# Patient Record
Sex: Male | Born: 1935 | Hispanic: Yes | Marital: Married | Smoking: Never smoker
Health system: Southern US, Community
[De-identification: ages and names within clinical notes are randomized; demographics above are authoritative.]

## PROBLEM LIST (undated history)

## (undated) DIAGNOSIS — N400 Enlarged prostate without lower urinary tract symptoms: Secondary | ICD-10-CM

## (undated) DIAGNOSIS — I219 Acute myocardial infarction, unspecified: Secondary | ICD-10-CM

## (undated) DIAGNOSIS — Z972 Presence of dental prosthetic device (complete) (partial): Secondary | ICD-10-CM

## (undated) DIAGNOSIS — M545 Low back pain, unspecified: Secondary | ICD-10-CM

## (undated) DIAGNOSIS — I2510 Atherosclerotic heart disease of native coronary artery without angina pectoris: Secondary | ICD-10-CM

## (undated) DIAGNOSIS — M5030 Other cervical disc degeneration, unspecified cervical region: Secondary | ICD-10-CM

## (undated) DIAGNOSIS — D179 Benign lipomatous neoplasm, unspecified: Secondary | ICD-10-CM

## (undated) DIAGNOSIS — N189 Chronic kidney disease, unspecified: Secondary | ICD-10-CM

## (undated) DIAGNOSIS — E079 Disorder of thyroid, unspecified: Secondary | ICD-10-CM

## (undated) DIAGNOSIS — J449 Chronic obstructive pulmonary disease, unspecified: Secondary | ICD-10-CM

## (undated) DIAGNOSIS — I10 Essential (primary) hypertension: Secondary | ICD-10-CM

## (undated) DIAGNOSIS — M1990 Unspecified osteoarthritis, unspecified site: Secondary | ICD-10-CM

## (undated) HISTORY — DX: Unspecified osteoarthritis, unspecified site: M19.90

## (undated) HISTORY — PX: HEMORRHOID SURGERY: SHX153

## (undated) HISTORY — DX: Other cervical disc degeneration, unspecified cervical region: M50.30

## (undated) HISTORY — DX: Chronic obstructive pulmonary disease, unspecified: J44.9

## (undated) HISTORY — DX: Disorder of thyroid, unspecified: E07.9

## (undated) HISTORY — PX: CHOLECYSTECTOMY: SHX55

## (undated) HISTORY — DX: Atherosclerotic heart disease of native coronary artery without angina pectoris: I25.10

## (undated) HISTORY — DX: Benign prostatic hyperplasia without lower urinary tract symptoms: N40.0

## (undated) HISTORY — DX: Chronic kidney disease, unspecified: N18.9

## (undated) HISTORY — DX: Benign lipomatous neoplasm, unspecified: D17.9

---

## 1998-05-09 HISTORY — PX: CARDIAC CATHETERIZATION: SHX172

## 2008-03-09 HISTORY — PX: BLADDER SURGERY: SHX569

## 2008-03-18 ENCOUNTER — Ambulatory Visit: Payer: Self-pay | Admitting: Urology

## 2008-03-18 ENCOUNTER — Ambulatory Visit: Payer: Self-pay | Admitting: Cardiology

## 2008-03-24 ENCOUNTER — Ambulatory Visit: Payer: Self-pay | Admitting: Urology

## 2008-03-24 HISTORY — PX: TRANSURETHRAL RESECTION OF PROSTATE: SHX73

## 2011-11-17 ENCOUNTER — Ambulatory Visit: Payer: Self-pay | Admitting: Family Medicine

## 2012-05-09 HISTORY — PX: CATARACT EXTRACTION W/ INTRAOCULAR LENS  IMPLANT, BILATERAL: SHX1307

## 2012-11-26 ENCOUNTER — Ambulatory Visit: Payer: Self-pay | Admitting: Ophthalmology

## 2012-12-11 ENCOUNTER — Ambulatory Visit: Payer: Self-pay | Admitting: Ophthalmology

## 2013-06-19 ENCOUNTER — Ambulatory Visit: Payer: Self-pay | Admitting: Family Medicine

## 2013-10-14 DIAGNOSIS — R0600 Dyspnea, unspecified: Secondary | ICD-10-CM | POA: Insufficient documentation

## 2013-10-14 DIAGNOSIS — R0601 Orthopnea: Secondary | ICD-10-CM | POA: Insufficient documentation

## 2013-10-14 DIAGNOSIS — R079 Chest pain, unspecified: Secondary | ICD-10-CM | POA: Insufficient documentation

## 2013-10-14 DIAGNOSIS — I2510 Atherosclerotic heart disease of native coronary artery without angina pectoris: Secondary | ICD-10-CM | POA: Insufficient documentation

## 2013-10-14 DIAGNOSIS — R0609 Other forms of dyspnea: Secondary | ICD-10-CM | POA: Insufficient documentation

## 2014-03-13 ENCOUNTER — Ambulatory Visit: Payer: Self-pay | Admitting: Unknown Physician Specialty

## 2014-03-13 HISTORY — PX: COLONOSCOPY: SHX174

## 2014-05-21 ENCOUNTER — Ambulatory Visit: Payer: Self-pay | Admitting: Family Medicine

## 2014-08-07 ENCOUNTER — Ambulatory Visit
Admit: 2014-08-07 | Disposition: A | Discharge status: Home / Self Care | Payer: Self-pay | Attending: Primary Care | Admitting: Primary Care

## 2014-08-29 NOTE — Op Note (Signed)
PATIENT NAME:  Joseph Osborn, Joseph Osborn MR#:  [id number] DATE OF BIRTH:  [birth year]  DATE OF PROCEDURE:  12/11/2012  PREOPERATIVE DIAGNOSIS: Visually significant cataract of the right eye.   POSTOPERATIVE DIAGNOSIS: Visually significant cataract of the right eye.   OPERATIVE PROCEDURE: Cataract extraction by phacoemulsification with implant of intraocular lens to right eye.   SURGEON: Birder Robson, MD.   ANESTHESIA:  1. Managed anesthesia care.  2. Topical tetracaine drops followed by 2% Xylocaine jelly applied in the preoperative holding area.   COMPLICATIONS: None.   TECHNIQUE: Stop and chop.  DESCRIPTION OF PROCEDURE: The patient was examined and consented in the preoperative holding area where the aforementioned topical anesthesia was applied to the right eye and then brought back to the Operating Room where the right eye was prepped and draped in the usual sterile ophthalmic fashion and a lid speculum was placed. A paracentesis was created with the side port blade and the anterior chamber was filled with viscoelastic. A near clear corneal incision was performed with the steel keratome. A continuous curvilinear capsulorrhexis was performed with a cystotome followed by the capsulorrhexis forceps. Hydrodissection and hydrodelineation were carried out with BSS on a blunt cannula. The lens was removed in a stop and chop technique and the remaining cortical material was removed with the irrigation-aspiration handpiece. The capsular bag was inflated with viscoelastic and the Tecnis ZCB00 15.5-diopter lens, serial number [id number], was placed in the capsular bag without complication. The remaining viscoelastic was removed from the eye with the irrigation-aspiration handpiece. The wounds were hydrated. The anterior chamber was flushed with Miostat and the eye was inflated to physiologic pressure. 0.1 mL of cefuroxime concentration 10 mg/mL was placed in the anterior chamber. The wounds were found  to be water tight. The eye was dressed with Vigamox. The patient was given protective glasses to wear throughout the day and a shield with which to sleep tonight. The patient was also given drops with which to begin a drop regimen today and will follow-up with me in one day.    ____________________________ Livingston Diones. Naydeen Speirs, MD wlp:jm D: 12/11/2012 17:19:20 ET T: 12/11/2012 19:37:15 ET JOB#: [id number]  cc: Opie Fanton L. Joelene Barriere, MD, <Dictator> Livingston Diones Mykiah Schmuck MD ELECTRONICALLY SIGNED 12/13/2012 15:32

## 2014-09-01 LAB — SURGICAL PATHOLOGY

## 2014-09-02 DIAGNOSIS — M7540 Impingement syndrome of unspecified shoulder: Secondary | ICD-10-CM | POA: Insufficient documentation

## 2015-05-10 DIAGNOSIS — D179 Benign lipomatous neoplasm, unspecified: Secondary | ICD-10-CM

## 2015-05-10 HISTORY — DX: Benign lipomatous neoplasm, unspecified: D17.9

## 2016-01-08 ENCOUNTER — Other Ambulatory Visit: Payer: Self-pay | Admitting: Family Medicine

## 2016-01-08 DIAGNOSIS — D171 Benign lipomatous neoplasm of skin and subcutaneous tissue of trunk: Secondary | ICD-10-CM

## 2016-01-12 ENCOUNTER — Ambulatory Visit
Admission: RE | Admit: 2016-01-12 | Discharge: 2016-01-12 | Disposition: A | Discharge status: Home / Self Care | Payer: Medicare Other | Source: Ambulatory Visit | Attending: Family Medicine | Admitting: Family Medicine

## 2016-01-12 ENCOUNTER — Other Ambulatory Visit: Payer: Self-pay | Admitting: Family Medicine

## 2016-01-12 DIAGNOSIS — D171 Benign lipomatous neoplasm of skin and subcutaneous tissue of trunk: Secondary | ICD-10-CM

## 2016-01-20 ENCOUNTER — Other Ambulatory Visit: Payer: Self-pay

## 2016-01-20 DIAGNOSIS — N183 Chronic kidney disease, stage 3 unspecified: Secondary | ICD-10-CM | POA: Insufficient documentation

## 2016-01-20 DIAGNOSIS — N400 Enlarged prostate without lower urinary tract symptoms: Secondary | ICD-10-CM | POA: Insufficient documentation

## 2016-01-20 DIAGNOSIS — J449 Chronic obstructive pulmonary disease, unspecified: Secondary | ICD-10-CM | POA: Insufficient documentation

## 2016-01-20 DIAGNOSIS — I2089 Other forms of angina pectoris: Secondary | ICD-10-CM | POA: Insufficient documentation

## 2016-01-20 DIAGNOSIS — I208 Other forms of angina pectoris: Secondary | ICD-10-CM | POA: Insufficient documentation

## 2016-01-20 DIAGNOSIS — N182 Chronic kidney disease, stage 2 (mild): Secondary | ICD-10-CM | POA: Insufficient documentation

## 2016-01-20 DIAGNOSIS — M5030 Other cervical disc degeneration, unspecified cervical region: Secondary | ICD-10-CM | POA: Insufficient documentation

## 2016-01-20 DIAGNOSIS — M1990 Unspecified osteoarthritis, unspecified site: Secondary | ICD-10-CM | POA: Insufficient documentation

## 2016-01-20 DIAGNOSIS — E039 Hypothyroidism, unspecified: Secondary | ICD-10-CM | POA: Insufficient documentation

## 2016-01-21 ENCOUNTER — Ambulatory Visit (INDEPENDENT_AMBULATORY_CARE_PROVIDER_SITE_OTHER): Payer: Medicare Other | Admitting: Surgery

## 2016-01-21 ENCOUNTER — Other Ambulatory Visit
Admission: RE | Admit: 2016-01-21 | Discharge: 2016-01-21 | Disposition: A | Discharge status: Home / Self Care | Payer: Medicare Other | Source: Ambulatory Visit | Attending: Surgery | Admitting: Surgery

## 2016-01-21 ENCOUNTER — Encounter: Payer: Self-pay | Admitting: Surgery

## 2016-01-21 ENCOUNTER — Ambulatory Visit: Payer: Self-pay | Admitting: Surgery

## 2016-01-21 ENCOUNTER — Other Ambulatory Visit: Payer: Self-pay

## 2016-01-21 VITALS — BP 149/82 | HR 66 | Temp 97.7°F | Ht 67.0 in | Wt 176.0 lb

## 2016-01-21 DIAGNOSIS — R222 Localized swelling, mass and lump, trunk: Secondary | ICD-10-CM | POA: Diagnosis present

## 2016-01-21 LAB — COMPREHENSIVE METABOLIC PANEL
ALBUMIN: 4.5 g/dL (ref 3.5–5.0)
ALK PHOS: 61 U/L (ref 38–126)
ALT: 17 U/L (ref 17–63)
AST: 20 U/L (ref 15–41)
Anion gap: 5 (ref 5–15)
BUN: 18 mg/dL (ref 6–20)
CALCIUM: 9.2 mg/dL (ref 8.9–10.3)
CO2: 28 mmol/L (ref 22–32)
Chloride: 107 mmol/L (ref 101–111)
Creatinine, Ser: 1.07 mg/dL (ref 0.61–1.24)
GFR calc Af Amer: >60 mL/min (ref >60)
GFR calc non Af Amer: 60 mL/min — ABNORMAL LOW (ref >60)
Glucose, Bld: 83 mg/dL (ref 65–99)
Potassium: 5.3 mmol/L — ABNORMAL HIGH (ref 3.5–5.1)
SODIUM: 140 mmol/L (ref 135–145)
Total Bilirubin: 0.9 mg/dL (ref 0.3–1.2)
Total Protein: 7.6 g/dL (ref 6.5–8.1)

## 2016-01-21 NOTE — Patient Instructions (Addendum)
Your CT scan is scheduled for 02/02/16 @ 3:00pm at [facility]. We will need for you to go to the lab today. Please see follow up appointment listed below. Please see your follow up appointment listed below with Dr.pabon.

## 2016-01-21 NOTE — Progress Notes (Signed)
Patient ID: Joseph Osborn, male   DOB: 01-06-1926, 82 y.o.   MRN: [id number]  HPI Joseph Osborn is a 82 y.o. male   HPI Joseph Osborn its a relatively healthy 82 year old male referred by Dr. Posey Pronto for a posterior chest wall mass. He reports that and this was found incidentally by Dr. Posey Pronto a few weeks ago. He states that this mass sometimes it is tender at times but he is unsure whether is his back or is actually related to the soft tissue mass. He reports intermittent moderate dull pain on his back on his posterior chest wall. He also reports having had multiple mild falls in the last 2-4 weeks. He does have a history of coronary artery disease and having an MI in 1999 requiring a stent. He has a great cardiovascular performance is able to do more than 4 Mets of activity without any shortness of breath or chest pain. He is very lucid and very independent. Dr. Posey Pronto ordered an ultrasound showing a hypoechoic solid area measuring 9 cm x 7 cm. And this was well circumscribed but did not look like a lipoma nor a solid mass   PMHX: CAD HTN  Surg HX Open Chole  No family history on file.  Social History Social History  Substance Use Topics  . Smoking status: Never Smoker  . Smokeless tobacco: Never Used  . Alcohol use Yes     Comment: very rare    Allergies  Allergen Reactions  . Other Nausea And Vomiting  . Oxybutynin Other (See Comments)  . Shellfish Allergy Other (See Comments)    Current Outpatient Prescriptions  Medication Sig Dispense Refill  . atenolol (TENORMIN) 50 MG tablet      No current facility-administered medications for this visit.      Review of Systems A 10 point review of systems was asked and was negative except for the information on the HPI  Physical Exam Blood pressure (!) 149/82, pulse 66, temperature 97.7 F (36.5 C), temperature source Oral, height 5\' 7"  (1.702 m), weight 79.8 kg (176 lb). CONSTITUTIONAL: NAD well developed EYES:  Pupils are equal, round, and reactive to light, Sclera are non-icteric. EARS, NOSE, MOUTH AND THROAT: The oropharynx is clear. The oral mucosa is pink and moist. Hearing is intact to voice. LYMPH NODES:  Lymph nodes in the neck are normal. RESPIRATORY:  Lungs are clear. There is normal respiratory effort, with equal breath sounds bilaterally, and without pathologic use of accessory muscles. CARDIOVASCULAR: Heart is regular without murmurs, gallops, or rubs. CHEST WALL: There is a mobile left posterior chest wall prominence encompassing an area of 10 x 13 cm approximately, it is mobile, it is not solid and a is reminiscence of a cyst GI: The abdomen is  soft, nontender, and nondistended. There are no palpable masses. There is no hepatosplenomegaly. There are normal bowel sounds in all quadrants.  Rectal deferred.   MUSCULOSKELETAL: Normal muscle strength and tone. No cyanosis or edema.   SKIN: Turgor is good and there are no pathologic skin lesions or ulcers. NEUROLOGIC: Motor and sensation is grossly normal. Cranial nerves are grossly intact. PSYCH:  Oriented to person, place and time. Affect is normal.  Data Reviewed I have personally reviewed the patient's imaging, laboratory findings and medical records.    Assessment Plan Soft tissue mass on the posterior chest wall unclear etiology favor cystic component versus seroma. Given the size the next step would be to do a CT scan of the  chest wall to rule out any malignant lesions. Discussed with the patient in detail about his disease process and about the need to obtain further diagnostic testing before definitive management can be obtained. He understands and is in agreement with the plan  Caroleen Hamman, MD FACS General Surgeon 01/21/2016, 3:33 PM

## 2016-02-02 ENCOUNTER — Ambulatory Visit
Admission: RE | Admit: 2016-02-02 | Discharge: 2016-02-02 | Disposition: A | Discharge status: Home / Self Care | Payer: Medicare Other | Source: Ambulatory Visit | Attending: Surgery | Admitting: Surgery

## 2016-02-02 DIAGNOSIS — J479 Bronchiectasis, uncomplicated: Secondary | ICD-10-CM | POA: Diagnosis not present

## 2016-02-02 DIAGNOSIS — D171 Benign lipomatous neoplasm of skin and subcutaneous tissue of trunk: Secondary | ICD-10-CM | POA: Insufficient documentation

## 2016-02-02 DIAGNOSIS — Q2546 Tortuous aortic arch: Secondary | ICD-10-CM | POA: Insufficient documentation

## 2016-02-02 DIAGNOSIS — R222 Localized swelling, mass and lump, trunk: Secondary | ICD-10-CM | POA: Diagnosis not present

## 2016-02-02 DIAGNOSIS — I700 Atherosclerosis of aorta: Secondary | ICD-10-CM | POA: Diagnosis not present

## 2016-02-02 MED ORDER — IOPAMIDOL (ISOVUE-300) INJECTION 61%
75.0000 mL | Freq: Once | INTRAVENOUS | Status: AC | PRN
  Administered 2016-02-02: 75 mL via INTRAVENOUS

## 2016-02-04 ENCOUNTER — Telehealth: Payer: Self-pay

## 2016-02-04 NOTE — Telephone Encounter (Signed)
Patient notified of CT results at this time. Also reminded patient of his follow up appointment with DR.Pabon on 02/11/16 @ 9:30. Verbalized understanding.

## 2016-02-11 ENCOUNTER — Telehealth: Payer: Self-pay

## 2016-02-11 ENCOUNTER — Ambulatory Visit (INDEPENDENT_AMBULATORY_CARE_PROVIDER_SITE_OTHER): Payer: Medicare Other | Admitting: Surgery

## 2016-02-11 ENCOUNTER — Encounter: Payer: Self-pay | Admitting: Surgery

## 2016-02-11 VITALS — BP 161/88 | HR 80 | Temp 97.6°F | Ht 67.0 in | Wt 171.8 lb

## 2016-02-11 DIAGNOSIS — K921 Melena: Secondary | ICD-10-CM

## 2016-02-11 NOTE — Patient Instructions (Signed)
Nosotros lo vamos a llamar con una cita para su endoscopia y colonoscopia. Si tiene alguna pregunta, por favor llamenos.

## 2016-02-11 NOTE — Telephone Encounter (Signed)
Can you please schedule an Endoscopy and Colonoscopy for this patient. Thank you! Diagnosis: Melena and Hematochezia  Patient speaks Spanish only.

## 2016-02-11 NOTE — Progress Notes (Signed)
Outpatient Surgical Follow Up  02/11/2016  Joseph Osborn is an 82 y.o. male.   Chief Complaint  Patient presents with  . Follow-up    Lipoma on Left Posterior Chest Wall    HPI: F/U back lipoma. Ct scan reviewed, Evidence of large lipoma. No evidence of malignant characteristics, no evidence of sarcoma.  HE also report some constipation, some melena and hematochezia. HE had a colonoscopy about 2 years ago but hematochezia is new. Now melena is more frequent.   Past Medical History:  Diagnosis Date  . Arthritis   . BPH (benign prostatic hyperplasia)   . CAD (coronary artery disease)   . Chronic kidney disease    Stage III  . COPD (chronic obstructive pulmonary disease) ([place])   . DDD (degenerative disc disease), cervical   . Lipoma 2017  . Thyroid disease    Hypothyroidism    History reviewed. No pertinent surgical history.  Family History  Problem Relation Age of Onset  . Heart disease Mother     Social History:  reports that he has never smoked. He has never used smokeless tobacco. He reports that he drinks alcohol. He reports that he does not use drugs.  Allergies:  Allergies  Allergen Reactions  . Other Nausea And Vomiting  . Oxybutynin Other (See Comments)  . Shellfish Allergy Other (See Comments)    Medications reviewed.    ROS Full ROS performed and is otherwise negative other than what is included on HPI   BP (!) 161/88   Pulse 80   Temp 97.6 F (36.4 C) (Oral)   Ht 5\' 7"  (1.702 m)   Wt 77.9 kg (171 lb 12.8 oz)   BMI 26.91 kg/m   Physical Exam  Constitutional: He is oriented to person, place, and time and well-developed, well-nourished, and in no distress. No distress.  Eyes: Conjunctivae are normal. No scleral icterus.  Neck: Normal range of motion. Neck supple. No JVD present.  Pulmonary/Chest: Effort normal. No respiratory distress.  Large sub q mass below left scapula, mobile and soft  Abdominal: Soft. He exhibits no distension.  There is no tenderness.  Musculoskeletal: Normal range of motion. He exhibits no edema.  Neurological: He is alert and oriented to person, place, and time. Gait normal. GCS score is 15.  Skin: Skin is warm and dry.  Psychiatric: Mood, memory, affect and judgment normal.  Nursing note and vitals reviewed.      No results found for this or any previous visit (from the past 48 hour(s)). No results found.  Assessment/Plan: Lipoma d/w the pt about options. Excisions vs obs. He wishes no to have surgery at this time. His main complaint today was melena. We will arrange for upper and lower scopes w Dr. Allen Norris, I gave him the options of having scope by Dr. Tiffany Kocher or Dr. Allen Norris and he chooses Dr. Allen Norris. At least 25 minutes spent in this encounter w the majority of time allocated to counseling.  Caroleen Hamman, MD FACS General Surgeon  02/11/2016,12:39 PM

## 2016-02-15 ENCOUNTER — Other Ambulatory Visit: Payer: Self-pay

## 2016-02-15 NOTE — Telephone Encounter (Signed)
Gastroenterology Pre-Procedure Review  Request Date: 03/03/2016 Requesting Physician: Dr. Posey Pronto  PATIENT REVIEW QUESTIONS: The patient responded to the following health history questions as indicated:    1. Are you having any GI issues? yes (constipation ) 2. Do you have a personal history of Polyps? no 3. Do you have a family history of Colon Cancer or Polyps? no 4. Diabetes Mellitus? no 5. Joint replacements in the past 12 months?no 6. Major health problems in the past 3 months?no 7. Any artificial heart valves, MVP, or defibrillator?no    MEDICATIONS & ALLERGIES:    Patient reports the following regarding taking any anticoagulation/antiplatelet therapy:   Plavix, Coumadin, Eliquis, Xarelto, Lovenox, Pradaxa, Brilinta, or Effient? no Aspirin? no  Patient confirms/reports the following medications:  Current Outpatient Prescriptions  Medication Sig Dispense Refill  . atenolol (TENORMIN) 50 MG tablet Take 50 mg by mouth daily.      No current facility-administered medications for this visit.     Patient confirms/reports the following allergies:  Allergies  Allergen Reactions  . Other Nausea And Vomiting  . Oxybutynin Other (See Comments)  . Shellfish Allergy Other (See Comments)    No orders of the defined types were placed in this encounter.   AUTHORIZATION INFORMATION Primary Insurance: 1D#: Group #:  Secondary Insurance: 1D#: Group #:  SCHEDULE INFORMATION: Date: 03/03/2016 Time: Location: [facility]

## 2016-02-15 NOTE — Telephone Encounter (Signed)
Colonoscopy & EGD  Melena & hematochezia K92.1 [facility] 03/03/2016 Medicaid/Medicare  Pre cert is not required

## 2016-02-24 ENCOUNTER — Encounter: Payer: Self-pay | Admitting: *Deleted

## 2016-03-02 NOTE — Discharge Instructions (Signed)
Anestesia general, adultos, cuidados posteriores °(General Anesthesia, Adult, Care After) °Siga estas instrucciones durante las próximas semanas. Estas indicaciones le proporcionan información acerca de cómo deberá cuidarse después del procedimiento. El médico también podrá darle instrucciones más específicas. El tratamiento se ha planificado de acuerdo a las prácticas médicas actuales, pero a veces se producen problemas. Comuníquese con el médico si tiene algún problema o tiene dudas después del procedimiento. °QUÉ ESPERAR DESPUÉS DEL PROCEDIMIENTO °Después del procedimiento es habitual experimentar: °· Somnolencia. °· Náuseas y vómitos. °INSTRUCCIONES PARA EL CUIDADO EN EL HOGAR °· Durante las primeras 24 horas luego de la anestesia general: °¨ Haga que una persona responsable se quede con usted. °¨ No conduzca un automóvil. Si está solo, no viaje en transporte público. °¨ No beba alcohol. °¨ No tome medicamentos que no le haya recetado su médico. °¨ No firme documentos importantes ni tome decisiones trascendentes. °¨ Puede reanudar su dieta y sus actividades normales según le haya indicado el médico. °· Cambie los vendajes (apósitos) tal como se le indicó. °· Si tiene preguntas o se le presenta algún problema relacionado con la anestesia general, comuníquese con el hospital y pida por el anestesista o anestesiólogo de guardia. °SOLICITE ATENCIÓN MÉDICA SI: °· Tiene náuseas y vómitos durante el día posterior a la anestesia. °· Le aparece una erupción cutánea. °SOLICITE ATENCIÓN MÉDICA DE INMEDIATO SI:  °· Tiene dificultad para respirar. °· Siente dolor en el pecho. °· Tiene algún problema alérgico. °  °Esta información no tiene como fin reemplazar el consejo del médico. Asegúrese de hacerle al médico cualquier pregunta que tenga. °  °Document Released: 04/25/2005 Document Revised: 05/16/2014 °Elsevier Interactive Patient Education ©2016 Elsevier Inc. ° °

## 2016-03-03 ENCOUNTER — Ambulatory Visit
Admission: RE | Admit: 2016-03-03 | Discharge: 2016-03-03 | Disposition: A | Discharge status: Home / Self Care | Payer: Medicare Other | Source: Ambulatory Visit | Attending: Gastroenterology | Admitting: Gastroenterology

## 2016-03-03 ENCOUNTER — Ambulatory Visit: Payer: Medicare Other | Admitting: Anesthesiology

## 2016-03-03 ENCOUNTER — Encounter
Admission: RE | Disposition: A | Discharge status: Home / Self Care | Payer: Self-pay | Source: Ambulatory Visit | Attending: Gastroenterology

## 2016-03-03 DIAGNOSIS — K449 Diaphragmatic hernia without obstruction or gangrene: Secondary | ICD-10-CM | POA: Insufficient documentation

## 2016-03-03 DIAGNOSIS — J449 Chronic obstructive pulmonary disease, unspecified: Secondary | ICD-10-CM | POA: Diagnosis not present

## 2016-03-03 DIAGNOSIS — K2970 Gastritis, unspecified, without bleeding: Secondary | ICD-10-CM | POA: Diagnosis not present

## 2016-03-03 DIAGNOSIS — I2510 Atherosclerotic heart disease of native coronary artery without angina pectoris: Secondary | ICD-10-CM | POA: Insufficient documentation

## 2016-03-03 DIAGNOSIS — K648 Other hemorrhoids: Secondary | ICD-10-CM | POA: Diagnosis not present

## 2016-03-03 DIAGNOSIS — K921 Melena: Secondary | ICD-10-CM | POA: Insufficient documentation

## 2016-03-03 DIAGNOSIS — I252 Old myocardial infarction: Secondary | ICD-10-CM | POA: Insufficient documentation

## 2016-03-03 DIAGNOSIS — M1990 Unspecified osteoarthritis, unspecified site: Secondary | ICD-10-CM | POA: Insufficient documentation

## 2016-03-03 DIAGNOSIS — N400 Enlarged prostate without lower urinary tract symptoms: Secondary | ICD-10-CM | POA: Insufficient documentation

## 2016-03-03 DIAGNOSIS — N183 Chronic kidney disease, stage 3 (moderate): Secondary | ICD-10-CM | POA: Diagnosis not present

## 2016-03-03 DIAGNOSIS — E039 Hypothyroidism, unspecified: Secondary | ICD-10-CM | POA: Diagnosis not present

## 2016-03-03 HISTORY — PX: ESOPHAGOGASTRODUODENOSCOPY (EGD) WITH PROPOFOL: SHX5813

## 2016-03-03 HISTORY — DX: Presence of dental prosthetic device (complete) (partial): Z97.2

## 2016-03-03 HISTORY — DX: Acute myocardial infarction, unspecified: I21.9

## 2016-03-03 HISTORY — DX: Low back pain: M54.5

## 2016-03-03 HISTORY — DX: Low back pain, unspecified: M54.50

## 2016-03-03 HISTORY — PX: COLONOSCOPY WITH PROPOFOL: SHX5780

## 2016-03-03 SURGERY — COLONOSCOPY WITH PROPOFOL
Anesthesia: Monitor Anesthesia Care | Wound class: Contaminated

## 2016-03-03 MED ORDER — ACETAMINOPHEN 325 MG PO TABS: 325.0000 mg | ORAL_TABLET | ORAL | Status: DC | PRN

## 2016-03-03 MED ORDER — ACETAMINOPHEN 160 MG/5ML PO SOLN: 325.0000 mg | ORAL | Status: DC | PRN

## 2016-03-03 MED ORDER — LIDOCAINE HCL (CARDIAC) 20 MG/ML IV SOLN
INTRAVENOUS | Status: DC | PRN
  Administered 2016-03-03: 50 mg via INTRAVENOUS

## 2016-03-03 MED ORDER — LACTATED RINGERS IV SOLN
INTRAVENOUS | Status: DC
  Administered 2016-03-03: 10:00:00 via INTRAVENOUS

## 2016-03-03 MED ORDER — GLYCOPYRROLATE 0.2 MG/ML IJ SOLN
INTRAMUSCULAR | Status: DC | PRN
  Administered 2016-03-03: .2 mg via INTRAVENOUS

## 2016-03-03 MED ORDER — PROPOFOL 10 MG/ML IV BOLUS
INTRAVENOUS | Status: DC | PRN
  Administered 2016-03-03 (×5): 20 mg via INTRAVENOUS
  Administered 2016-03-03 (×2): 10 mg via INTRAVENOUS
  Administered 2016-03-03: 60 mg via INTRAVENOUS
  Administered 2016-03-03 (×4): 20 mg via INTRAVENOUS

## 2016-03-03 MED ORDER — STERILE WATER FOR IRRIGATION IR SOLN
Status: DC | PRN
  Administered 2016-03-03: 10:00:00

## 2016-03-03 SURGICAL SUPPLY — 35 items

## 2016-03-03 NOTE — Op Note (Signed)
[facility] Gastroenterology Patient Name: Joseph Osborn Procedure Date: 03/03/2016 9:52 AM MRN: [id number] Account #: [id number] Date of Birth: 1925-05-23 Admit Type: Outpatient Age: 82 Room: [facility] OR ROOM 01 Gender: Male Note Status: Finalized Procedure:            Colonoscopy Indications:          Hematochezia Providers:            Lucilla Lame MD, MD Medicines:            Propofol per Anesthesia Complications:        No immediate complications. Procedure:            Pre-Anesthesia Assessment:                       - Prior to the procedure, a History and Physical was                        performed, and patient medications and allergies were                        reviewed. The patient's tolerance of previous                        anesthesia was also reviewed. The risks and benefits of                        the procedure and the sedation options and risks were                        discussed with the patient. All questions were                        answered, and informed consent was obtained. Prior                        Anticoagulants: The patient has taken no previous                        anticoagulant or antiplatelet agents. ASA Grade                        Assessment: II - A patient with mild systemic disease.                        After reviewing the risks and benefits, the patient was                        deemed in satisfactory condition to undergo the                        procedure.                       After obtaining informed consent, the colonoscope was                        passed under direct vision. Throughout the procedure,                        the patient's blood pressure, pulse, and oxygen  saturations were monitored continuously. The Olympus CF                        H180AL colonoscope (S#: [id number]) was introduced through                        the anus and advanced to the the cecum, identified by              appendiceal orifice and ileocecal valve. The                        colonoscopy was performed without difficulty. The                        patient tolerated the procedure well. The quality of                        the bowel preparation was excellent. Findings:      The perianal and digital rectal examinations were normal.      Non-bleeding internal hemorrhoids were found during retroflexion. The       hemorrhoids were Grade II (internal hemorrhoids that prolapse but reduce       spontaneously). Impression:           - Non-bleeding internal hemorrhoids.                       - No specimens collected. Recommendation:       - Discharge patient to home.                       - Resume previous diet.                       - Continue present medications. Procedure Code(s):    --- Professional ---                       [phone], Colonoscopy, flexible; diagnostic, including                        collection of specimen(s) by brushing or washing, when                        performed (separate procedure) Diagnosis Code(s):    --- Professional ---                       K92.1, Melena (includes Hematochezia) CPT copyright 2016 American Medical Association. All rights reserved. The codes documented in this report are preliminary and upon coder review may  be revised to meet current compliance requirements. Lucilla Lame MD, MD 03/03/2016 10:27:46 AM This report has been signed electronically. Number of Addenda: 0 Note Initiated On: 03/03/2016 9:52 AM Scope Withdrawal Time: 0 hours 5 minutes 51 seconds  Total Procedure Duration: 0 hours 9 minutes 41 seconds       [facility]

## 2016-03-03 NOTE — Anesthesia Preprocedure Evaluation (Signed)
Anesthesia Evaluation  Patient identified by MRN, date of birth, ID band Patient awake    Reviewed: Allergy & Precautions, H&P , NPO status   Airway Mallampati: II  TM Distance: >3 FB Neck ROM: full    Dental   Pulmonary COPD,    breath sounds clear to auscultation       Cardiovascular + angina + CAD, + Past MI and + DOE   Rhythm:regular Rate:Normal     Neuro/Psych    GI/Hepatic   Endo/Other  Hypothyroidism   Renal/GU Renal disease     Musculoskeletal   Abdominal   Peds  Hematology   Anesthesia Other Findings   Reproductive/Obstetrics                             Anesthesia Physical Anesthesia Plan  ASA: III  Anesthesia Plan: MAC   Post-op Pain Management:    Induction:   Airway Management Planned:   Additional Equipment:   Intra-op Plan:   Post-operative Plan:   Informed Consent: I have reviewed the patients History and Physical, chart, labs and discussed the procedure including the risks, benefits and alternatives for the proposed anesthesia with the patient or authorized representative who has indicated his/her understanding and acceptance.     Plan Discussed with: CRNA  Anesthesia Plan Comments:         Anesthesia Quick Evaluation

## 2016-03-03 NOTE — Anesthesia Postprocedure Evaluation (Signed)
Anesthesia Post Note  Patient: Joseph Osborn  Procedure(s) Performed: Procedure(s) (LRB): COLONOSCOPY WITH PROPOFOL (N/A) ESOPHAGOGASTRODUODENOSCOPY (EGD) WITH PROPOFOL (N/A)  Patient location during evaluation: PACU Anesthesia Type: MAC Level of consciousness: awake and alert Pain management: pain level controlled Vital Signs Assessment: post-procedure vital signs reviewed and stable Respiratory status: spontaneous breathing, nonlabored ventilation, respiratory function stable and patient connected to nasal cannula oxygen Cardiovascular status: stable and blood pressure returned to baseline Anesthetic complications: no    Amaryllis Dyke

## 2016-03-03 NOTE — Transfer of Care (Signed)
Immediate Anesthesia Transfer of Care Note  Patient: Joseph Osborn  Procedure(s) Performed: Procedure(s) with comments: COLONOSCOPY WITH PROPOFOL (N/A) ESOPHAGOGASTRODUODENOSCOPY (EGD) WITH PROPOFOL (N/A) - Interpreter needed  Patient Location: PACU  Anesthesia Type: MAC  Level of Consciousness: awake, alert  and patient cooperative  Airway and Oxygen Therapy: Patient Spontanous Breathing and Patient connected to supplemental oxygen  Post-op Assessment: Post-op Vital signs reviewed, Patient's Cardiovascular Status Stable, Respiratory Function Stable, Patent Airway and No signs of Nausea or vomiting  Post-op Vital Signs: Reviewed and stable  Complications: No apparent anesthesia complications

## 2016-03-03 NOTE — Op Note (Signed)
[facility] Gastroenterology Patient Name: Joseph Osborn Procedure Date: 03/03/2016 9:52 AM MRN: [id number] Account #: [id number] Date of Birth: 1925-11-15 Admit Type: Outpatient Age: 82 Room: [facility] OR ROOM 01 Gender: Male Note Status: Finalized Procedure:            Upper GI endoscopy Indications:          Melena Providers:            Lucilla Lame MD, MD Referring MD:         Denton Lank MD, MD (Referring MD) Medicines:            Propofol per Anesthesia Complications:        No immediate complications. Procedure:            Pre-Anesthesia Assessment:                       - Prior to the procedure, a History and Physical was                        performed, and patient medications and allergies were                        reviewed. The patient's tolerance of previous                        anesthesia was also reviewed. The risks and benefits of                        the procedure and the sedation options and risks were                        discussed with the patient. All questions were                        answered, and informed consent was obtained. Prior                        Anticoagulants: The patient has taken no previous                        anticoagulant or antiplatelet agents. ASA Grade                        Assessment: II - A patient with mild systemic disease.                        After reviewing the risks and benefits, the patient was                        deemed in satisfactory condition to undergo the                        procedure.                       After obtaining informed consent, the endoscope was                        passed under direct vision. Throughout the procedure,  the patient's blood pressure, pulse, and oxygen                        saturations were monitored continuously. The Olympus                        GIF H180J colonscope [id number] was introduced                        through the mouth,  and advanced to the second part of                        duodenum. The upper GI endoscopy was accomplished                        without difficulty. The patient tolerated the procedure                        well. Findings:      A small hiatal hernia was present.      Localized moderate inflammation characterized by erythema was found in       the gastric antrum. Biopsies were taken with a cold forceps for       histology.      The examined duodenum was normal. Impression:           - Small hiatal hernia.                       - Gastritis. Biopsied.                       - Normal examined duodenum. Recommendation:       - Discharge patient to home.                       - Resume previous diet.                       - Continue present medications.                       - Await pathology results.                       - Perform a colonoscopy today. Procedure Code(s):    --- Professional ---                       [phone], Esophagogastroduodenoscopy, flexible, transoral;                        with biopsy, single or multiple Diagnosis Code(s):    --- Professional ---                       K92.1, Melena (includes Hematochezia)                       K29.70, Gastritis, unspecified, without bleeding CPT copyright 2016 American Medical Association. All rights reserved. The codes documented in this report are preliminary and upon coder review may  be revised to meet current compliance requirements. Lucilla Lame MD, MD 03/03/2016 10:12:49 AM This report has been signed electronically. Number of Addenda: 0 Note Initiated On: 03/03/2016  9:52 AM      [facility]

## 2016-03-03 NOTE — Anesthesia Procedure Notes (Signed)
Procedure Name: MAC Performed by: Lucielle Vokes Pre-anesthesia Checklist: Patient identified, Emergency Drugs available, Suction available, Timeout performed and Patient being monitored Patient Re-evaluated:Patient Re-evaluated prior to inductionOxygen Delivery Method: Nasal cannula Placement Confirmation: positive ETCO2       

## 2016-03-03 NOTE — H&P (Signed)
Lucilla Lame, MD [facility] [address] [place], [place] [address] Phone: [phone] Fax : [phone]  Primary Care Physician:  Baltazar Apo, MD Primary Gastroenterologist:  Dr. Allen Norris  Pre-Procedure History & Physical: HPI:  Joseph Osborn is a 82 y.o. male is here for an endoscopy and colonoscopy.   Past Medical History:  Diagnosis Date  . Arthritis   . BPH (benign prostatic hyperplasia)   . CAD (coronary artery disease)   . Chronic kidney disease    Stage III  . COPD (chronic obstructive pulmonary disease) ([place])   . DDD (degenerative disc disease), cervical   . Lipoma 2017  . Lower back pain   . Myocardial infarction    approx 2000  . Thyroid disease    Hypothyroidism  . Wears dentures    partial upper    Past Surgical History:  Procedure Laterality Date  . CARDIAC CATHETERIZATION  2000   1 stent placed after MI  . CATARACT EXTRACTION W/ INTRAOCULAR LENS  IMPLANT, BILATERAL  2014   [facility], Dr. George Ina  . CHOLECYSTECTOMY     In [place], [place]  . COLONOSCOPY  03/13/2014   [facility], Dr. Vira Agar  . [place], [place]  . TRANSURETHRAL RESECTION OF PROSTATE  03/24/2008   Dr Eliberto Ivory, [facility]    Prior to Admission medications   Medication Sig Start Date End Date Taking? Authorizing Provider  atenolol (TENORMIN) 50 MG tablet Take 50 mg by mouth daily.  12/22/15  Yes Historical Provider, MD  Omega-3 Fatty Acids (OMEGA 3 PO) Take by mouth.   Yes Historical Provider, MD    Allergies as of 02/15/2016 - never reviewed  Allergen Reaction Noted  . Other Nausea And Vomiting 02/20/2014  . Oxybutynin Other (See Comments) 02/20/2014  . Shellfish allergy Other (See Comments) 02/20/2014    Family History  Problem Relation Age of Onset  . Heart disease Mother     Social History   Social History  . Marital status: Married    Spouse name: N/A  . Number of children: N/A  . Years of education: N/A   Occupational History  . Not on file.    Social History Main Topics  . Smoking status: Never Smoker  . Smokeless tobacco: Never Used  . Alcohol use Yes     Comment: very rare(Holidays)  . Drug use: No  . Sexual activity: Not on file   Other Topics Concern  . Not on file   Social History Narrative  . No narrative on file    Review of Systems: See HPI, otherwise negative ROS  Physical Exam: BP (!) 164/92   Pulse 93   Temp 97.5 F (36.4 C) (Temporal)   Resp 16   Ht 5\' 7"  (1.702 m)   Wt 168 lb (76.2 kg)   SpO2 96%   BMI 26.31 kg/m  General:   Alert,  pleasant and cooperative in NAD Head:  Normocephalic and atraumatic. Neck:  Supple; no masses or thyromegaly. Lungs:  Clear throughout to auscultation.    Heart:  Regular rate and rhythm. Abdomen:  Soft, nontender and nondistended. Normal bowel sounds, without guarding, and without rebound.   Neurologic:  Alert and  oriented x4;  grossly normal neurologically.  Impression/Plan: Joseph Osborn is here for an endoscopy and colonoscopy to be performed for melena and hematochezia  Risks, benefits, limitations, and alternatives regarding  endoscopy and colonoscopy have been reviewed with the patient.  Questions have been answered.  All parties  agreeable.   Lucilla Lame, MD  03/03/2016, 9:54 AM

## 2016-03-04 ENCOUNTER — Encounter: Payer: Self-pay | Admitting: Gastroenterology

## 2016-03-08 ENCOUNTER — Encounter: Payer: Self-pay | Admitting: Gastroenterology
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
## 2017-09-08 ENCOUNTER — Other Ambulatory Visit: Payer: Self-pay

## 2017-09-08 ENCOUNTER — Emergency Department
Admission: EM | Admit: 2017-09-08 | Discharge: 2017-09-09 | Disposition: A | Discharge status: Home / Self Care | Payer: Medicare Other | Attending: Emergency Medicine | Admitting: Emergency Medicine

## 2017-09-08 ENCOUNTER — Encounter: Payer: Self-pay | Admitting: Emergency Medicine

## 2017-09-08 DIAGNOSIS — I252 Old myocardial infarction: Secondary | ICD-10-CM | POA: Insufficient documentation

## 2017-09-08 DIAGNOSIS — E039 Hypothyroidism, unspecified: Secondary | ICD-10-CM | POA: Insufficient documentation

## 2017-09-08 DIAGNOSIS — I2510 Atherosclerotic heart disease of native coronary artery without angina pectoris: Secondary | ICD-10-CM | POA: Diagnosis not present

## 2017-09-08 DIAGNOSIS — K5900 Constipation, unspecified: Secondary | ICD-10-CM | POA: Diagnosis not present

## 2017-09-08 DIAGNOSIS — R1084 Generalized abdominal pain: Secondary | ICD-10-CM | POA: Diagnosis not present

## 2017-09-08 DIAGNOSIS — R112 Nausea with vomiting, unspecified: Secondary | ICD-10-CM | POA: Insufficient documentation

## 2017-09-08 DIAGNOSIS — Z79899 Other long term (current) drug therapy: Secondary | ICD-10-CM | POA: Insufficient documentation

## 2017-09-08 DIAGNOSIS — N183 Chronic kidney disease, stage 3 (moderate): Secondary | ICD-10-CM | POA: Diagnosis not present

## 2017-09-08 DIAGNOSIS — J449 Chronic obstructive pulmonary disease, unspecified: Secondary | ICD-10-CM | POA: Insufficient documentation

## 2017-09-08 LAB — HEPATIC FUNCTION PANEL
ALBUMIN: 4 g/dL (ref 3.5–5.0)
ALT: 18 U/L (ref 17–63)
AST: 26 U/L (ref 15–41)
Alkaline Phosphatase: 68 U/L (ref 38–126)
BILIRUBIN TOTAL: 1.6 mg/dL — AB (ref 0.3–1.2)
Bilirubin, Direct: 0.2 mg/dL (ref 0.1–0.5)
Indirect Bilirubin: 1.4 mg/dL — ABNORMAL HIGH (ref 0.3–0.9)
Total Protein: 7.5 g/dL (ref 6.5–8.1)

## 2017-09-08 LAB — CBC
HEMATOCRIT: 40.8 % (ref 40.0–52.0)
Hemoglobin: 14.1 g/dL (ref 13.0–18.0)
MCH: 31.2 pg (ref 26.0–34.0)
MCHC: 34.7 g/dL (ref 32.0–36.0)
MCV: 90.1 fL (ref 80.0–100.0)
PLATELETS: 144 10*3/uL — AB (ref 150–440)
RBC: 4.52 MIL/uL (ref 4.40–5.90)
RDW: 13.3 % (ref 11.5–14.5)
WBC: 6.9 10*3/uL (ref 3.8–10.6)

## 2017-09-08 LAB — BASIC METABOLIC PANEL
Anion gap: 9 (ref 5–15)
BUN: 16 mg/dL (ref 6–20)
CALCIUM: 8.8 mg/dL — AB (ref 8.9–10.3)
CO2: 24 mmol/L (ref 22–32)
CREATININE: 0.96 mg/dL (ref 0.61–1.24)
Chloride: 101 mmol/L (ref 101–111)
GFR calc Af Amer: >60 mL/min (ref >60)
GLUCOSE: 143 mg/dL — AB (ref 65–99)
POTASSIUM: 4 mmol/L (ref 3.5–5.1)
Sodium: 134 mmol/L — ABNORMAL LOW (ref 135–145)

## 2017-09-08 LAB — TROPONIN I: Troponin I: <0.03 ng/mL (ref <0.03)

## 2017-09-08 LAB — LIPASE, BLOOD: LIPASE: 25 U/L (ref 11–51)

## 2017-09-08 MED ORDER — SODIUM CHLORIDE 0.9 % IV BOLUS
1000.0000 mL | Freq: Once | INTRAVENOUS | Status: AC
  Administered 2017-09-08: 1000 mL via INTRAVENOUS

## 2017-09-08 MED ORDER — ONDANSETRON HCL 4 MG/2ML IJ SOLN
4.0000 mg | Freq: Once | INTRAMUSCULAR | Status: AC
  Administered 2017-09-08: 4 mg via INTRAVENOUS
  Filled 2017-09-08: qty 2

## 2017-09-08 NOTE — ED Triage Notes (Signed)
Pt arrives POV to triage with c/o weakness and vomiting since 1200. Pt states that he feels dizzy and weak at this time.

## 2017-09-09 ENCOUNTER — Encounter: Payer: Self-pay | Admitting: Radiology

## 2017-09-09 ENCOUNTER — Emergency Department: Payer: Medicare Other

## 2017-09-09 DIAGNOSIS — R112 Nausea with vomiting, unspecified: Secondary | ICD-10-CM | POA: Diagnosis not present

## 2017-09-09 LAB — URINALYSIS, COMPLETE (UACMP) WITH MICROSCOPIC
BACTERIA UA: NONE SEEN
Bilirubin Urine: NEGATIVE
Glucose, UA: NEGATIVE mg/dL
Hgb urine dipstick: NEGATIVE
KETONES UR: 5 mg/dL — AB
Leukocytes, UA: NEGATIVE
NITRITE: NEGATIVE
PH: 6 (ref 5.0–8.0)
Protein, ur: NEGATIVE mg/dL
Specific Gravity, Urine: 1.011 (ref 1.005–1.030)

## 2017-09-09 LAB — LACTIC ACID, PLASMA: LACTIC ACID, VENOUS: 1 mmol/L (ref 0.5–1.9)

## 2017-09-09 MED ORDER — METOCLOPRAMIDE HCL 10 MG PO TABS: 10.0000 mg | ORAL_TABLET | Freq: Three times a day (TID) | ORAL | 0 refills | Status: DC | PRN

## 2017-09-09 MED ORDER — POLYETHYLENE GLYCOL 3350 17 G PO PACK: 17.0000 g | PACK | Freq: Two times a day (BID) | ORAL | 0 refills | Status: DC

## 2017-09-09 MED ORDER — IOPAMIDOL (ISOVUE-370) INJECTION 76%
75.0000 mL | Freq: Once | INTRAVENOUS | Status: AC | PRN
  Administered 2017-09-09: 75 mL via INTRAVENOUS

## 2017-09-09 MED ORDER — FAMOTIDINE 40 MG PO TABS: 40.0000 mg | ORAL_TABLET | Freq: Every evening | ORAL | 0 refills | Status: DC

## 2017-09-09 NOTE — ED Notes (Signed)

## 2017-09-09 NOTE — ED Notes (Signed)
Patient transported to CT 
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
## 2019-08-28 ENCOUNTER — Other Ambulatory Visit: Payer: Self-pay | Admitting: Family Medicine

## 2019-08-28 DIAGNOSIS — R222 Localized swelling, mass and lump, trunk: Secondary | ICD-10-CM

## 2019-09-06 ENCOUNTER — Ambulatory Visit: Admission: RE | Admit: 2019-09-06 | Payer: Medicare Other | Source: Ambulatory Visit

## 2019-09-16 ENCOUNTER — Ambulatory Visit
Admission: RE | Admit: 2019-09-16 | Discharge: 2019-09-16 | Disposition: A | Discharge status: Home / Self Care | Payer: Medicare Other | Source: Ambulatory Visit | Attending: Family Medicine | Admitting: Family Medicine

## 2019-09-16 ENCOUNTER — Other Ambulatory Visit: Payer: Self-pay

## 2019-09-16 DIAGNOSIS — R222 Localized swelling, mass and lump, trunk: Secondary | ICD-10-CM | POA: Insufficient documentation

## 2019-09-16 HISTORY — DX: Essential (primary) hypertension: I10

## 2019-09-16 LAB — POCT I-STAT CREATININE: Creatinine, Ser: 1.1 mg/dL (ref 0.61–1.24)

## 2019-09-16 MED ORDER — IOHEXOL 300 MG/ML  SOLN
75.0000 mL | Freq: Once | INTRAMUSCULAR | Status: AC | PRN
  Administered 2019-09-16: 16:00:00 75 mL via INTRAVENOUS

## 2020-03-17 ENCOUNTER — Ambulatory Visit (INDEPENDENT_AMBULATORY_CARE_PROVIDER_SITE_OTHER): Payer: Medicare Other | Admitting: Family Medicine

## 2020-03-17 ENCOUNTER — Encounter: Payer: Self-pay | Admitting: Family Medicine

## 2020-03-17 ENCOUNTER — Ambulatory Visit (INDEPENDENT_AMBULATORY_CARE_PROVIDER_SITE_OTHER)
Admission: RE | Admit: 2020-03-17 | Discharge: 2020-03-17 | Disposition: A | Discharge status: Home / Self Care | Payer: Medicare Other | Source: Ambulatory Visit | Attending: Family Medicine | Admitting: Family Medicine

## 2020-03-17 ENCOUNTER — Other Ambulatory Visit: Payer: Self-pay

## 2020-03-17 VITALS — BP 122/64 | HR 69 | Temp 98.2°F | Ht 64.5 in | Wt 182.2 lb

## 2020-03-17 DIAGNOSIS — K921 Melena: Secondary | ICD-10-CM

## 2020-03-17 DIAGNOSIS — J449 Chronic obstructive pulmonary disease, unspecified: Secondary | ICD-10-CM

## 2020-03-17 DIAGNOSIS — R3912 Poor urinary stream: Secondary | ICD-10-CM

## 2020-03-17 DIAGNOSIS — N401 Enlarged prostate with lower urinary tract symptoms: Secondary | ICD-10-CM | POA: Diagnosis not present

## 2020-03-17 DIAGNOSIS — I2510 Atherosclerotic heart disease of native coronary artery without angina pectoris: Secondary | ICD-10-CM | POA: Diagnosis not present

## 2020-03-17 DIAGNOSIS — R053 Chronic cough: Secondary | ICD-10-CM

## 2020-03-17 DIAGNOSIS — K5909 Other constipation: Secondary | ICD-10-CM

## 2020-03-17 LAB — POC URINALSYSI DIPSTICK (AUTOMATED)
Bilirubin, UA: NEGATIVE
Blood, UA: NEGATIVE
Glucose, UA: NEGATIVE
Ketones, UA: NEGATIVE
Leukocytes, UA: NEGATIVE
Nitrite, UA: NEGATIVE
Protein, UA: NEGATIVE
Spec Grav, UA: >=1.03 — AB (ref 1.010–1.025)
Urobilinogen, UA: 0.2 E.U./dL
pH, UA: 6 (ref 5.0–8.0)

## 2020-03-17 NOTE — Progress Notes (Signed)
This visit was conducted in person.  BP 122/64 (BP Location: Left Arm, Patient Position: Sitting, Cuff Size: Normal)    Pulse 69    Temp 98.2 F (36.8 C) (Temporal)    Ht 5' 4.5" (1.638 m)    Wt 182 lb 4 oz (82.7 kg)    SpO2 95%    BMI 30.80 kg/m    CC: new pt to establish Subjective:    Patient ID: Joseph Osborn, male    DOB: [birth year], 86 y.o.   MRN: [id number]  HPI: Joseph Osborn is a 86 y.o. male presenting on 03/17/2020 for New Patient (Initial Visit) (Pt accompanied by daughter, [name]- temp 97.7.) and Discuss Medication (Wants to discuss melatonin 1 mg. )   Pleasant father of my current patient [name] who presents to establish care. Here with daughter [name].  Previously saw Dr Posey Pronto at [facility].   Main concern today is ongoing sinus congestion and sputum production worse at night time - wakes him up several times a night. Trying to manage with home remedies. Symptoms ongoing for 3 months. Occasionally associated with cough. No associated dyspnea. Manages with robitussin as well as home remedies.   BPH s/p TURP - notes decreased urinary flow, managing with flomax 0.69m daily. Has seen urology s/p cauterization of bladder polyps then s/p course of Elmiron (about 6 yrs ago).   Known h/o [facility] as well as COPD. BP managed by atenolol 567mdaily.   Chronic constipation managed with linzess QOD.  COPD - h/o significant second hand smoke exposure.   No unexpected weight loss.  Occasional black stools. No pepto bismol or iron.   Lives with wife, daughter (OMichelene Headyand her husband and son [place] retired, previously worked maArmed forces logistics/support/administrative officer    Relevant past medical, surgical, family and social history reviewed and updated as indicated. Interim medical history since our last visit reviewed. Allergies and medications reviewed and updated. Outpatient Medications Prior to Visit  Medication Sig Dispense Refill   atenolol  (TENORMIN) 50 MG tablet Take 50 mg by mouth daily.      linaclotide (LINZESS) 72 MCG capsule 72 mcg.     tamsulosin (FLOMAX) 0.4 MG CAPS capsule Take 0.4 mg by mouth daily.     famotidine (PEPCID) 40 MG tablet Take 1 tablet (40 mg total) by mouth every evening. 20 tablet 0   metoCLOPramide (REGLAN) 10 MG tablet Take 1 tablet (10 mg total) by mouth every 8 (eight) hours as needed. 20 tablet 0   Omega-3 Fatty Acids (OMEGA 3 PO) Take by mouth.     polyethylene glycol (MIRALAX) packet Take 17 g by mouth 2 (two) times daily. 14 each 0   No facility-administered medications prior to visit.     Per HPI unless specifically indicated in ROS section below Review of Systems Objective:  BP 122/64 (BP Location: Left Arm, Patient Position: Sitting, Cuff Size: Normal)    Pulse 69    Temp 98.2 F (36.8 C) (Temporal)    Ht 5' 4.5" (1.638 m)    Wt 182 lb 4 oz (82.7 kg)    SpO2 95%    BMI 30.80 kg/m   Wt Readings from Last 3 Encounters:  03/17/20 182 lb 4 oz (82.7 kg)  09/08/17 180 lb (81.6 kg)  03/03/16 168 lb (76.2 kg)      Physical Exam Vitals and nursing note reviewed.  Constitutional:      Appearance:  Normal appearance. He is not ill-appearing.  HENT:     Head: Normocephalic and atraumatic.     Nose: Nose normal. No congestion or rhinorrhea.     Right Turbinates: Pale. Not enlarged or swollen.     Left Turbinates: Pale. Not enlarged or swollen.     Mouth/Throat:     Mouth: Mucous membranes are moist.     Pharynx: Oropharynx is clear. No oropharyngeal exudate or posterior oropharyngeal erythema.  Eyes:     Extraocular Movements: Extraocular movements intact.     Conjunctiva/sclera: Conjunctivae normal.     Pupils: Pupils are equal, round, and reactive to light.  Cardiovascular:     Rate and Rhythm: Normal rate and regular rhythm.     Pulses: Normal pulses.     Heart sounds: Normal heart sounds. No murmur heard.   Pulmonary:     Effort: Pulmonary effort is normal. No respiratory  distress.     Breath sounds: Normal breath sounds. No wheezing, rhonchi or rales.  Musculoskeletal:     Right lower leg: No edema.     Left lower leg: No edema.  Lymphadenopathy:     Cervical: No cervical adenopathy.  Skin:    General: Skin is warm and dry.     Findings: No rash.  Neurological:     Mental Status: He is alert.  Psychiatric:        Mood and Affect: Mood normal.        Behavior: Behavior normal.       Results for orders placed or performed in visit on 03/17/20  POCT Urinalysis Dipstick (Automated)  Result Value Ref Range   Color, UA yellow    Clarity, UA clear    Glucose, UA Negative Negative   Bilirubin, UA negative    Ketones, UA negative    Spec Grav, UA >=1.030 (A) 1.010 - 1.025   Blood, UA negative    pH, UA 6.0 5.0 - 8.0   Protein, UA Negative Negative   Urobilinogen, UA 0.2 0.2 or 1.0 E.U./dL   Nitrite, UA negative    Leukocytes, UA Negative Negative  DG Chest 2 View CLINICAL DATA:  Productive cough for 3 months.  EXAM: CHEST - 2 VIEW  COMPARISON:  CT chest 09/16/2019  FINDINGS: The heart size and mediastinal contours are within normal limits.  Flattening of bilateral hemidiaphragms consistent with known emphysematous changes. Bibasilar atelectasis. No focal consolidation. No pulmonary edema. No pleural effusion. No pneumothorax.  No acute osseous abnormality. Multilevel degenerative changes of the spine.  IMPRESSION: No active cardiopulmonary disease.  Electronically Signed   By: Iven Finn M.D.   On: 03/18/2020 14:57   Assessment & Plan:  This visit occurred during the SARS-CoV-2 public health emergency.  Safety protocols were in place, including screening questions prior to the visit, additional usage of staff PPE, and extensive cleaning of exam room while observing appropriate contact time as indicated for disinfecting solutions.   Problem List Items Addressed This Visit    COPD (chronic obstructive pulmonary disease) ([place])      Not on respiratory medication. Endorses chronic sinus congestion with sputum production that wakes him up at night. See below.  Update CXR today, consider spiriva.       Chronic cough - Primary    Endorses 18moh/o chronic productive cough associated with sinus and chest congestion, worse at night time, affecting sleep. Check CXR. Recommend trial of mucinex. Consider spiriva trial. Lungs overall clear today.  Does not like to use  flonase nasal spray.       Relevant Orders   DG Chest 2 View (Completed)   Chronic constipation    Continue linzess QOD dosing.       CAD (coronary artery disease)    Followed by cardiology, on atenolol 29m daily.  Doesn't seem to be on aspirin.       Black stool    Endorses occasional black stools without weight loss or other systemic symptoms. Not on iron or pepto bismol use. Check iFOB.      Relevant Orders   Fecal occult blood, imunochemical   POCT Urinalysis Dipstick (Automated) (Completed)   Benign prostatic hyperplasia    Chronic s/p TURP, sounds like also has h/o bladder polyps. Last saw urology about 6 yrs ago. He continues flomax 0.4108mnightly for ongoing weak stream. Consider return to urology vs trial finasteride.       Relevant Medications   tamsulosin (FLOMAX) 0.4 MG CAPS capsule       No orders of the defined types were placed in this encounter.  Orders Placed This Encounter  Procedures   Fecal occult blood, imunochemical    Standing Status:   Future    Standing Expiration Date:   03/17/2021   DG Chest 2 View    Standing Status:   Future    Number of Occurrences:   1    Standing Expiration Date:   03/17/2021    Order Specific Question:   Reason for Exam (SYMPTOM  OR DIAGNOSIS REQUIRED)    Answer:   productive cough for 3 months    Order Specific Question:   Preferred imaging location?    Answer:   [facility]   POCT Urinalysis Dipstick (Automated)    Patient Instructions  Urinalysis today (examen de orina)   Pass by lab to pick up stool kit.  Xray today (plaqua de torax). Trate medicina guaifenesin (mucinex) - puede usar fast relief mucinex con vaso de agua para expectorar moco. Tomar mucha agua.  Regresar en 1-2 meses para seguimiento    Follow up plan: Return in about 2 months (around 05/17/2020), or if symptoms worsen or fail to improve, for follow up visit.  JaRia BushMD

## 2020-03-17 NOTE — Patient Instructions (Addendum)
Urinalysis today (examen de orina)  Pass by lab to pick up stool kit.  Xray today (plaqua de torax). Trate medicina guaifenesin (mucinex) - puede usar fast relief mucinex con vaso de agua para expectorar moco. Tomar mucha agua.  Regresar en 1-2 meses para seguimiento

## 2020-03-19 ENCOUNTER — Encounter: Payer: Self-pay | Admitting: Family Medicine

## 2020-03-19 ENCOUNTER — Other Ambulatory Visit: Payer: Self-pay | Admitting: Family Medicine

## 2020-03-19 DIAGNOSIS — K5909 Other constipation: Secondary | ICD-10-CM | POA: Insufficient documentation

## 2020-03-19 DIAGNOSIS — R053 Chronic cough: Secondary | ICD-10-CM | POA: Insufficient documentation

## 2020-03-19 DIAGNOSIS — K921 Melena: Secondary | ICD-10-CM | POA: Insufficient documentation

## 2020-03-19 MED ORDER — UMECLIDINIUM BROMIDE 62.5 MCG/INH IN AEPB: 1.0000 | INHALATION_SPRAY | Freq: Every day | RESPIRATORY_TRACT | 3 refills | Status: DC

## 2020-03-19 NOTE — Assessment & Plan Note (Addendum)
Endorses 59mo h/o chronic productive cough associated with sinus and chest congestion, worse at night time, affecting sleep. Check CXR. Recommend trial of mucinex. Consider spiriva trial. Lungs overall clear today.  Does not like to use flonase nasal spray.

## 2020-03-19 NOTE — Assessment & Plan Note (Signed)
Endorses occasional black stools without weight loss or other systemic symptoms. Not on iron or pepto bismol use. Check iFOB.

## 2020-03-19 NOTE — Assessment & Plan Note (Signed)
Chronic s/p TURP, sounds like also has h/o bladder polyps. Last saw urology about 6 yrs ago. He continues flomax 0.4mg  nightly for ongoing weak stream. Consider return to urology vs trial finasteride.

## 2020-03-19 NOTE — Assessment & Plan Note (Signed)
Not on respiratory medication. Endorses chronic sinus congestion with sputum production that wakes him up at night. See below.  Update CXR today, consider spiriva.

## 2020-03-19 NOTE — Assessment & Plan Note (Signed)
Continue linzess QOD dosing.

## 2020-03-19 NOTE — Assessment & Plan Note (Addendum)
Followed by cardiology, on atenolol 50mg  daily.  Doesn't seem to be on aspirin.

## 2020-03-20 ENCOUNTER — Other Ambulatory Visit (INDEPENDENT_AMBULATORY_CARE_PROVIDER_SITE_OTHER): Payer: Medicare Other

## 2020-03-20 DIAGNOSIS — K921 Melena: Secondary | ICD-10-CM | POA: Diagnosis not present

## 2020-03-20 LAB — FECAL OCCULT BLOOD, GUAIAC: Fecal Occult Blood: NEGATIVE

## 2020-03-20 LAB — FECAL OCCULT BLOOD, IMMUNOCHEMICAL: Fecal Occult Bld: NEGATIVE

## 2020-03-23 ENCOUNTER — Encounter: Payer: Self-pay | Admitting: Family Medicine

## 2020-05-18 ENCOUNTER — Ambulatory Visit (INDEPENDENT_AMBULATORY_CARE_PROVIDER_SITE_OTHER): Payer: Medicare Other | Admitting: Family Medicine

## 2020-05-18 ENCOUNTER — Other Ambulatory Visit: Payer: Self-pay

## 2020-05-18 ENCOUNTER — Encounter: Payer: Self-pay | Admitting: Family Medicine

## 2020-05-18 VITALS — BP 124/76 | HR 71 | Temp 97.9°F | Ht 64.5 in | Wt 183.2 lb

## 2020-05-18 DIAGNOSIS — K5909 Other constipation: Secondary | ICD-10-CM

## 2020-05-18 DIAGNOSIS — R140 Abdominal distension (gaseous): Secondary | ICD-10-CM

## 2020-05-18 DIAGNOSIS — R0981 Nasal congestion: Secondary | ICD-10-CM | POA: Diagnosis not present

## 2020-05-18 DIAGNOSIS — M8949 Other hypertrophic osteoarthropathy, multiple sites: Secondary | ICD-10-CM | POA: Diagnosis not present

## 2020-05-18 DIAGNOSIS — R053 Chronic cough: Secondary | ICD-10-CM

## 2020-05-18 DIAGNOSIS — J449 Chronic obstructive pulmonary disease, unspecified: Secondary | ICD-10-CM

## 2020-05-18 DIAGNOSIS — M159 Polyosteoarthritis, unspecified: Secondary | ICD-10-CM

## 2020-05-18 NOTE — Progress Notes (Signed)
Patient ID: Joseph Osborn, male    DOB: 06-24-1925, 87 y.o.   MRN: [id number]  This visit was conducted in person.  BP 124/76 (BP Location: Left Arm, Patient Position: Sitting, Cuff Size: Normal)   Pulse 71   Temp 97.9 F (36.6 C) (Temporal)   Ht 5' 4.5" (1.638 m)   Wt 183 lb 3 oz (83.1 kg)   SpO2 95%   BMI 30.96 kg/m    CC: 2 mo f/u visit  Subjective:   HPI: Joseph Osborn is a 87 y.o. male presenting on 05/18/2020 for Follow-up (Here for 2 mo f/u.  Pt accompanied by daughter, [name]- temp 97.6.)   Pt established care last month.  See prior note for details.   COPD/emphysema by xray - last visit we started incruse ellipta + mucinex for chronic productive cough associated with sinus and chest congestion - didn't try mucinex. Feels incruse ellipta has helped breathing some, but notices increased phlegm production. Predominant concern remains significant nasal congestion.   Notes increased gassiness over months. No diet changes. No weight changes. No abd pain, nausea/vomiting, diarrhea. Has been told has hiatal hernia on previous endoscopy. Chronic constipation managed with linzess. Limited water intake, limited fiber.   Notes L knee pain - asks about knee brace use. Worse trouble with going up stairs.      Relevant past medical, surgical, family and social history reviewed and updated as indicated. Interim medical history since our last visit reviewed. Allergies and medications reviewed and updated. Outpatient Medications Prior to Visit  Medication Sig Dispense Refill  . atenolol (TENORMIN) 50 MG tablet Take 50 mg by mouth daily.     Marland Kitchen linaclotide (LINZESS) 72 MCG capsule 72 mcg.    . tamsulosin (FLOMAX) 0.4 MG CAPS capsule Take 0.4 mg by mouth daily.    Marland Kitchen umeclidinium bromide (INCRUSE ELLIPTA) 62.5 MCG/INH AEPB Inhale 1 puff into the lungs daily. 30 each 3   No facility-administered medications prior to visit.     Per HPI unless specifically indicated in ROS section below Review  of Systems Objective:  BP 124/76 (BP Location: Left Arm, Patient Position: Sitting, Cuff Size: Normal)   Pulse 71   Temp 97.9 F (36.6 C) (Temporal)   Ht 5' 4.5" (1.638 m)   Wt 183 lb 3 oz (83.1 kg)   SpO2 95%   BMI 30.96 kg/m   Wt Readings from Last 3 Encounters:  05/18/20 183 lb 3 oz (83.1 kg)  03/17/20 182 lb 4 oz (82.7 kg)  09/08/17 180 lb (81.6 kg)      Physical Exam Vitals and nursing note reviewed.  Constitutional:      Appearance: Normal appearance. He is not ill-appearing.  HENT:     Mouth/Throat:     Mouth: Mucous membranes are moist.     Pharynx: Oropharynx is clear. No oropharyngeal exudate or posterior oropharyngeal erythema.  Eyes:     Extraocular Movements: Extraocular movements intact.     Pupils: Pupils are equal, round, and reactive to light.  Cardiovascular:     Rate and Rhythm: Normal rate and regular rhythm.     Pulses: Normal pulses.     Heart sounds: Normal heart sounds. No murmur heard.   Pulmonary:     Effort: Pulmonary effort is normal. No respiratory distress.     Breath sounds: Normal breath sounds. No wheezing, rhonchi or rales.  Abdominal:     General: Abdomen is flat. Bowel sounds are normal. There is no distension.  Palpations: Abdomen is soft. There is no mass.     Tenderness: There is no abdominal tenderness. There is no right CVA tenderness, left CVA tenderness, guarding or rebound.     Hernia: No hernia is present.  Musculoskeletal:     Right lower leg: No edema.     Left lower leg: No edema.     Comments:  R knee WNL L knee exam: No deformity on inspection. No pain with palpation of knee landmarks. No effusion/swelling noted. FROM in flex/extension without crepitus. No popliteal fullness.  Skin:    General: Skin is warm and dry.     Findings: No rash.  Neurological:     Mental Status: He is alert.  Psychiatric:        Mood and Affect: Mood normal.        Behavior: Behavior normal.       Results for orders placed  or performed in visit on 03/23/20  Fecal Occult Blood, Guaiac  Result Value Ref Range   Fecal Occult Blood Negative   DG Chest 2 View CLINICAL DATA:  Productive cough for 3 months.  EXAM: CHEST - 2 VIEW  COMPARISON:  CT chest 09/16/2019  FINDINGS: The heart size and mediastinal contours are within normal limits.  Flattening of bilateral hemidiaphragms consistent with known emphysematous changes. Bibasilar atelectasis. No focal consolidation. No pulmonary edema. No pleural effusion. No pneumothorax.  No acute osseous abnormality. Multilevel degenerative changes of the spine.  IMPRESSION: No active cardiopulmonary disease.  Electronically Signed   By: Iven Finn M.D.   On: 03/18/2020 14:57   Assessment & Plan:  This visit occurred during the SARS-CoV-2 public health emergency.  Safety protocols were in place, including screening questions prior to the visit, additional usage of staff PPE, and extensive cleaning of exam room while observing appropriate contact time as indicated for disinfecting solutions.   Problem List Items Addressed This Visit    Osteoarthritis    Anticipate this contributes to L knee pain - recommend knee sleeve brace use. Consider voltaren gel.       Gassiness    Without significant other symptoms.  No recent abx use.  Recommend trial of lactose free diet for 2 wks (daughter will buy lactaid milk) and if ongoing symptoms, start pepcid nightly.  Discussed Gas-X use.  If ongoing, consider probiotic course.       COPD (chronic obstructive pulmonary disease) ([place])    Incruse ellipta may be helping - continue this.       Relevant Medications   umeclidinium bromide (INCRUSE ELLIPTA) 62.5 MCG/INH AEPB   Chronic nasal congestion - Primary    Predominant concern is chronic nasal congestion and mucous production. rec daily plain mucinex expectorant, update with effect.       Chronic cough    Continue incruse ellipta, add mucinex trial. Lungs  overall clear.       Chronic constipation    Continues linzess QOD, with BM only on days he takes linzess.  Discussed renewed efforts to increase water and fiber in diet.           Meds ordered this encounter  Medications  . umeclidinium bromide (INCRUSE ELLIPTA) 62.5 MCG/INH AEPB    Sig: Inhale 1 puff into the lungs daily.    Dispense:  30 each    Refill:  6   No orders of the defined types were placed in this encounter.   Patient Instructions  Debroah Baller cantidad de agua diaria.  Suba cantidad de  fibra en la dieta.  Puede tratar 2 semanas con dieta sin lactosa (leche sin lactosa).  Despues de tratar dieta sin lactosa y si siguen sintomas, comienze pepcid (famotidine) 20mg  cada noche (sin receta).  trate Gas-X para gases.  Dejeme saber como siguen sus sintomas. Siga incruse ellipta inhaladora. Trate mucinex (guaifenesin simple).  Puede tratar rodillera (knee sleeve brace) para rodilla izquierda.  Regresar en 2-3 meses con laboratorios para examen general (medicare wellness visit).   Follow up plan: Return in about 3 months (around 08/16/2020), or if symptoms worsen or fail to improve, for medicare wellness visit.  Ria Bush, MD

## 2020-05-18 NOTE — Patient Instructions (Addendum)
[place] cantidad de agua diaria.  Suba cantidad de [employer] dieta.  Puede tratar 2 semanas con dieta sin lactosa (leche sin lactosa).  Despues de tratar dieta sin lactosa y si siguen sintomas, comienze pepcid (famotidine) 20mg  cada noche (sin receta).  trate Gas-X para gases.  Dejeme saber como siguen sus sintomas. Siga incruse ellipta inhaladora. Trate mucinex (guaifenesin simple).  Puede tratar rodillera (knee sleeve brace) para rodilla izquierda.  Regresar en 2-3 meses con laboratorios para examen general (medicare wellness visit).

## 2020-05-19 DIAGNOSIS — R0981 Nasal congestion: Secondary | ICD-10-CM | POA: Insufficient documentation

## 2020-05-19 DIAGNOSIS — J310 Chronic rhinitis: Secondary | ICD-10-CM | POA: Insufficient documentation

## 2020-05-19 DIAGNOSIS — R140 Abdominal distension (gaseous): Secondary | ICD-10-CM | POA: Insufficient documentation

## 2020-05-19 MED ORDER — UMECLIDINIUM BROMIDE 62.5 MCG/INH IN AEPB: 1.0000 | INHALATION_SPRAY | Freq: Every day | RESPIRATORY_TRACT | 6 refills | Status: DC

## 2020-05-19 NOTE — Assessment & Plan Note (Signed)
Anticipate this contributes to L knee pain - recommend knee sleeve brace use. Consider voltaren gel.

## 2020-05-19 NOTE — Assessment & Plan Note (Signed)
Predominant concern is chronic nasal congestion and mucous production. rec daily plain mucinex expectorant, update with effect.

## 2020-05-19 NOTE — Assessment & Plan Note (Signed)
Continue incruse ellipta, add mucinex trial. Lungs overall clear.

## 2020-05-19 NOTE — Assessment & Plan Note (Signed)
Incruse ellipta may be helping - continue this.

## 2020-05-19 NOTE — Assessment & Plan Note (Signed)
Without significant other symptoms.  No recent abx use.  Recommend trial of lactose free diet for 2 wks (daughter will buy lactaid milk) and if ongoing symptoms, start pepcid nightly.  Discussed Gas-X use.  If ongoing, consider probiotic course.

## 2020-05-19 NOTE — Assessment & Plan Note (Signed)
Continues linzess QOD, with BM only on days he takes linzess.  Discussed renewed efforts to increase water and fiber in diet.

## 2020-07-12 ENCOUNTER — Other Ambulatory Visit: Payer: Self-pay | Admitting: Family Medicine

## 2020-07-12 DIAGNOSIS — Z13220 Encounter for screening for lipoid disorders: Secondary | ICD-10-CM

## 2020-07-12 DIAGNOSIS — E039 Hypothyroidism, unspecified: Secondary | ICD-10-CM

## 2020-07-12 DIAGNOSIS — N183 Chronic kidney disease, stage 3 unspecified: Secondary | ICD-10-CM

## 2020-07-14 ENCOUNTER — Other Ambulatory Visit: Payer: Self-pay

## 2020-07-14 ENCOUNTER — Ambulatory Visit (INDEPENDENT_AMBULATORY_CARE_PROVIDER_SITE_OTHER): Payer: Medicare Other

## 2020-07-14 DIAGNOSIS — Z0000 Encounter for general adult medical examination without abnormal findings: Secondary | ICD-10-CM

## 2020-07-14 NOTE — Progress Notes (Signed)
PCP notes:  Health Maintenance: Prevnar 13- due Tdap- insurance    Abnormal Screenings:    Patient concerns: Possible ear wax- has trouble hearing Nasal congestion Pain in both ankles at times   Nurse concerns: none   Next PCP appt.: 07/22/2020 @ 3 pm

## 2020-07-14 NOTE — Patient Instructions (Signed)
Joseph Osborn , Thank you for taking time to come for your Medicare Wellness Visit. I appreciate your ongoing commitment to your health goals. Please review the following plan we discussed and let me know if I can assist you in the future.   Screening recommendations/referrals: Colonoscopy: no longer required  Recommended yearly ophthalmology/optometry visit for glaucoma screening and checkup Recommended yearly dental visit for hygiene and checkup  Vaccinations: Influenza vaccine: Up to date, completed 03/14/2020, due 12/2020 Pneumococcal vaccine: due, will get at physical  Tdap vaccine: decline-insurance Shingles vaccine: due, check with your insurance regarding coverage if interested    Covid-19: Completed series  Advanced directives: Advance directive discussed with you today. Even though you declined this today please call our office should you change your mind and we can give you the proper paperwork for you to fill out.  Conditions/risks identified: coronary artery disease  Next appointment: Follow up in one year for your annual wellness visit.   Preventive Care 77 Years and Older, Male Preventive care refers to lifestyle choices and visits with your health care provider that can promote health and wellness. What does preventive care include?  A yearly physical exam. This is also called an annual well check.  Dental exams once or twice a year.  Routine eye exams. Ask your health care provider how often you should have your eyes checked.  Personal lifestyle choices, including:  Daily care of your teeth and gums.  Regular physical activity.  Eating a healthy diet.  Avoiding tobacco and drug use.  Limiting alcohol use.  Practicing safe sex.  Taking low doses of aspirin every day.  Taking vitamin and mineral supplements as recommended by your health care provider. What happens during an annual well check? The services and screenings done by your health care provider during  your annual well check will depend on your age, overall health, lifestyle risk factors, and family history of disease. Counseling  Your health care provider may ask you questions about your:  Alcohol use.  Tobacco use.  Drug use.  Emotional well-being.  Home and relationship well-being.  Sexual activity.  Eating habits.  History of falls.  Memory and ability to understand (cognition).  Work and work Statistician. Screening  You may have the following tests or measurements:  Height, weight, and BMI.  Blood pressure.  Lipid and cholesterol levels. These may be checked every 5 years, or more frequently if you are over 79 years old.  Skin check.  Lung cancer screening. You may have this screening every year starting at age 103 if you have a 30-pack-year history of smoking and currently smoke or have quit within the past 15 years.  Fecal occult blood test (FOBT) of the stool. You may have this test every year starting at age 4.  Flexible sigmoidoscopy or colonoscopy. You may have a sigmoidoscopy every 5 years or a colonoscopy every 10 years starting at age 24.  Prostate cancer screening. Recommendations will vary depending on your family history and other risks.  Hepatitis C blood test.  Hepatitis B blood test.  Sexually transmitted disease (STD) testing.  Diabetes screening. This is done by checking your blood sugar (glucose) after you have not eaten for a while (fasting). You may have this done every 1-3 years.  Abdominal aortic aneurysm (AAA) screening. You may need this if you are a current or former smoker.  Osteoporosis. You may be screened starting at age 15 if you are at high risk. Talk with your health care  provider about your test results, treatment options, and if necessary, the need for more tests. Vaccines  Your health care provider may recommend certain vaccines, such as:  Influenza vaccine. This is recommended every year.  Tetanus, diphtheria, and  acellular pertussis (Tdap, Td) vaccine. You may need a Td booster every 10 years.  Zoster vaccine. You may need this after age 48.  Pneumococcal 13-valent conjugate (PCV13) vaccine. One dose is recommended after age 25.  Pneumococcal polysaccharide (PPSV23) vaccine. One dose is recommended after age 28. Talk to your health care provider about which screenings and vaccines you need and how often you need them. This information is not intended to replace advice given to you by your health care provider. Make sure you discuss any questions you have with your health care provider. Document Released: 05/22/2015 Document Revised: 01/13/2016 Document Reviewed: 02/24/2015 Elsevier Interactive Patient Education  2017 [place] Prevention in the Home Falls can cause injuries. They can happen to people of all ages. There are many things you can do to make your home safe and to help prevent falls. What can I do on the outside of my home?  Regularly fix the edges of walkways and driveways and fix any cracks.  Remove anything that might make you trip as you walk through a door, such as a raised step or threshold.  Trim any bushes or trees on the path to your home.  Use bright outdoor lighting.  Clear any walking paths of anything that might make someone trip, such as rocks or tools.  Regularly check to see if handrails are loose or broken. Make sure that both sides of any steps have handrails.  Any raised decks and porches should have guardrails on the edges.  Have any leaves, snow, or ice cleared regularly.  Use sand or salt on walking paths during winter.  Clean up any spills in your garage right away. This includes oil or grease spills. What can I do in the bathroom?  Use night lights.  Install grab bars by the toilet and in the tub and shower. Do not use towel bars as grab bars.  Use non-skid mats or decals in the tub or shower.  If you need to sit down in the shower, use  a plastic, non-slip stool.  Keep the floor dry. Clean up any water that spills on the floor as soon as it happens.  Remove soap buildup in the tub or shower regularly.  Attach bath mats securely with double-sided non-slip rug tape.  Do not have throw rugs and other things on the floor that can make you trip. What can I do in the bedroom?  Use night lights.  Make sure that you have a light by your bed that is easy to reach.  Do not use any sheets or blankets that are too big for your bed. They should not hang down onto the floor.  Have a firm chair that has side arms. You can use this for support while you get dressed.  Do not have throw rugs and other things on the floor that can make you trip. What can I do in the kitchen?  Clean up any spills right away.  Avoid walking on wet floors.  Keep items that you use a lot in easy-to-reach places.  If you need to reach something above you, use a strong step stool that has a grab bar.  Keep electrical cords out of the way.  Do not use floor polish  or wax that makes floors slippery. If you must use wax, use non-skid floor wax.  Do not have throw rugs and other things on the floor that can make you trip. What can I do with my stairs?  Do not leave any items on the stairs.  Make sure that there are handrails on both sides of the stairs and use them. Fix handrails that are broken or loose. Make sure that handrails are as long as the stairways.  Check any carpeting to make sure that it is firmly attached to the stairs. Fix any carpet that is loose or worn.  Avoid having throw rugs at the top or bottom of the stairs. If you do have throw rugs, attach them to the floor with carpet tape.  Make sure that you have a light switch at the top of the stairs and the bottom of the stairs. If you do not have them, ask someone to add them for you. What else can I do to help prevent falls?  Wear shoes that:  Do not have high heels.  Have  rubber bottoms.  Are comfortable and fit you well.  Are closed at the toe. Do not wear sandals.  If you use a stepladder:  Make sure that it is fully opened. Do not climb a closed stepladder.  Make sure that both sides of the stepladder are locked into place.  Ask someone to hold it for you, if possible.  Clearly mark and make sure that you can see:  Any grab bars or handrails.  First and last steps.  Where the edge of each step is.  Use tools that help you move around (mobility aids) if they are needed. These include:  Canes.  Walkers.  Scooters.  Crutches.  Turn on the lights when you go into a dark area. Replace any light bulbs as soon as they burn out.  Set up your furniture so you have a clear path. Avoid moving your furniture around.  If any of your floors are uneven, fix them.  If there are any pets around you, be aware of where they are.  Review your medicines with your doctor. Some medicines can make you feel dizzy. This can increase your chance of falling. Ask your doctor what other things that you can do to help prevent falls. This information is not intended to replace advice given to you by your health care provider. Make sure you discuss any questions you have with your health care provider. Document Released: 02/19/2009 Document Revised: 10/01/2015 Document Reviewed: 05/30/2014 Elsevier Interactive Patient Education  2017 [employer].

## 2020-07-14 NOTE — Progress Notes (Signed)
Subjective:   Joseph Osborn is a 87 y.o. male who presents for Medicare Annual/Subsequent preventive examination.  Review of Systems: N/A      I connected with the patient today by telephone and verified that I am speaking with the correct person using two identifiers. Location patient: home Location nurse: work Persons participating in the telephone visit: patient, nurse.   I discussed the limitations, risks, security and privacy concerns of performing an evaluation and management service by telephone and the availability of in person appointments. I also discussed with the patient that there may be a patient responsible charge related to this service. The patient expressed understanding and verbally consented to this telephonic visit.        Cardiac Risk Factors include: advanced age (>66men, >87 women);male gender     Objective:    Today's Vitals   There is no height or weight on file to calculate BMI.  Advanced Directives 07/14/2020 03/03/2016  Does Patient Have a Medical Advance Directive? No No  Would patient like information on creating a medical advance directive? No - Patient declined No - patient declined information    Current Medications (verified) Outpatient Encounter Medications as of 07/14/2020  Medication Sig  . atenolol (TENORMIN) 50 MG tablet Take 50 mg by mouth daily.   Marland Kitchen linaclotide (LINZESS) 72 MCG capsule 72 mcg.  . tamsulosin (FLOMAX) 0.4 MG CAPS capsule Take 0.4 mg by mouth daily.  Marland Kitchen umeclidinium bromide (INCRUSE ELLIPTA) 62.5 MCG/INH AEPB Inhale 1 puff into the lungs daily.   No facility-administered encounter medications on file as of 07/14/2020.    Allergies (verified) Shellfish allergy and Oxybutynin   History: Past Medical History:  Diagnosis Date  . Arthritis   . BPH (benign prostatic hyperplasia)   . CAD (coronary artery disease)   . Chronic kidney disease    Stage III  . COPD (chronic obstructive pulmonary disease) ([place])   . DDD  (degenerative disc disease), cervical   . Hypertension   . Lipoma 2017  . Lower back pain   . Myocardial infarction ([place])    approx 2000  . Thyroid disease    Hypothyroidism  . Wears dentures    partial upper   Past Surgical History:  Procedure Laterality Date  . BLADDER SURGERY  03/2008   bladder polyp removed Yves Dill)  . CARDIAC CATHETERIZATION  2000   1 stent placed after MI  . CATARACT EXTRACTION W/ INTRAOCULAR LENS  IMPLANT, BILATERAL  2014   [facility], Dr. George Ina  . CHOLECYSTECTOMY     In [place]  . COLONOSCOPY  03/13/2014   [facility], Dr. Vira Agar  . COLONOSCOPY WITH PROPOFOL N/A 03/03/2016   Procedure: COLONOSCOPY WITH PROPOFOL;  Surgeon: Lucilla Lame, MD;  Location: [facility];  Service: Endoscopy;  Laterality: N/A;  . ESOPHAGOGASTRODUODENOSCOPY (EGD) WITH PROPOFOL N/A 03/03/2016   Procedure: ESOPHAGOGASTRODUODENOSCOPY (EGD) WITH PROPOFOL;  Surgeon: Lucilla Lame, MD;  Location: [facility];  Service: Endoscopy;  Laterality: N/A;  Interpreter needed  . HEMORRHOID SURGERY     [place], [place]  . TRANSURETHRAL RESECTION OF PROSTATE  03/24/2008   Dr Eliberto Ivory, [facility]   Family History  Problem Relation Age of Onset  . Heart disease Mother   . Heart attack Mother   . Liver cancer Cousin   . Diabetes Neg Hx    Social History   Socioeconomic History  . Marital status: Married    Spouse name: Not on file  . Number of children: Not on file  .  Years of education: Not on file  . Highest education level: Not on file  Occupational History  . Not on file  Tobacco Use  . Smoking status: Never Smoker  . Smokeless tobacco: Never Used  Substance and Sexual Activity  . Alcohol use: Not Currently    Comment: very rare(Holidays)  . Drug use: No  . Sexual activity: Not on file  Other Topics Concern  . Not on file  Social History Narrative   Lives with wife, daughter Joseph Osborn) and her husband and son   From [place], [place]   Occ: retired, previously  worked Armed forces logistics/support/administrative officer    Social Determinants of Radio broadcast assistant Strain: [place]   . Difficulty of Paying Living Expenses: Not hard at all  Food Insecurity: No Food Insecurity  . Worried About Charity fundraiser in the Last Year: Never true  . Ran Out of Food in the Last Year: Never true  Transportation Needs: No Transportation Needs  . Lack of Transportation (Medical): No  . Lack of Transportation (Non-Medical): No  Physical Activity: Insufficiently Active  . Days of Exercise per Week: 7 days  . Minutes of Exercise per Session: 20 min  Stress: No Stress Concern Present  . Feeling of Stress : Not at all  Social Connections: Not on file    Tobacco Counseling Counseling given: Not Answered   Clinical Intake:  Pre-visit preparation completed: Yes  Pain : No/denies pain     Nutritional Risks: None Diabetes: No  How often do you need to have someone help you when you read instructions, pamphlets, or other written materials from your doctor or pharmacy?: 1 - Never  Diabetic: No Nutrition Risk Assessment:  Has the patient had any N/V/D within the last 2 months?  No  Does the patient have any non-healing wounds?  No  Has the patient had any unintentional weight loss or weight gain?  No   Diabetes:  Is the patient diabetic?  No  If diabetic, was a CBG obtained today?  N/A Did the patient bring in their glucometer from home?  N/A How often do you monitor your CBG's? N/A.   Financial Strains and Diabetes Management:  Are you having any financial strains with the device, your supplies or your medication? N/A.  Does the patient want to be seen by Chronic Care Management for management of their diabetes?  N/A Would the patient like to be referred to a Nutritionist or for Diabetic Management?  N/A  Interpreter Needed?: No  Information entered by :: CJohnson, LPN   Activities of Daily Living In your present state of health, do you have any difficulty  performing the following activities: 07/14/2020  Hearing? Y  Comment has trouble hearing  Vision? N  Difficulty concentrating or making decisions? N  Walking or climbing stairs? N  Dressing or bathing? N  Doing errands, shopping? N  Preparing Food and eating ? N  Using the Toilet? N  In the past six months, have you accidently leaked urine? Y  Comment has trouble with leakage  Do you have problems with loss of bowel control? N  Managing your Medications? N  Managing your Finances? N  Housekeeping or managing your Housekeeping? N  Some recent data might be hidden    Patient Care Team: Ria Bush, MD as PCP - General (Family Medicine)  Indicate any recent Medical Services you may have received from other than [facility] providers in the past year (date may be approximate).  Assessment:   This is a routine wellness examination for [place].  Hearing/Vision screen  Hearing Screening   125Hz  250Hz  500Hz  1000Hz  2000Hz  3000Hz  4000Hz  6000Hz  8000Hz   Right ear:           Left ear:           Vision Screening Comments: Advised patient to get annual eye exams   Dietary issues and exercise activities discussed: Current Exercise Habits: Home exercise routine, Type of exercise: walking, Time (Minutes): 20, Frequency (Times/Week): 7, Weekly Exercise (Minutes/Week): 140, Intensity: Moderate, Exercise limited by: None identified  Goals    . Patient Stated     07/14/2020, I will continue to walk daily for about 15-20 minutes.       Depression Screen PHQ 2/9 Scores 07/14/2020 03/18/2020  PHQ - 2 Score 2 1  PHQ- 9 Score 2 9    Fall Risk Fall Risk  07/14/2020  Falls in the past year? 0  Number falls in past yr: 0  Injury with Fall? 0  Risk for fall due to : Medication side effect  Follow up Falls evaluation completed;Falls prevention discussed    FALL RISK PREVENTION PERTAINING TO THE HOME:  Any stairs in or around the home? Yes  If so, are there any without handrails? No  Home free  of loose throw rugs in walkways, pet beds, electrical cords, etc? Yes  Adequate lighting in your home to reduce risk of falls? Yes   ASSISTIVE DEVICES UTILIZED TO PREVENT FALLS:  Life alert? No  Use of a cane, walker or w/c? No  Grab bars in the bathroom? No  Shower chair or bench in shower? No  Elevated toilet seat or a handicapped toilet? No   TIMED UP AND GO:  Was the test performed? N/A telephone visit.   Cognitive Function: MMSE - Mini Mental State Exam 07/14/2020  Orientation to time 5  Orientation to Place 5  Registration 3  Attention/ Calculation 5  Recall 1  Language- repeat 1       Mini Cog  Mini-Cog screen was completed. Maximum score is 22. A value of 0 denotes this part of the MMSE was not completed or the patient failed this part of the Mini-Cog screening.  Immunizations Immunization History  Administered Date(s) Administered  . Influenza, High Dose Seasonal PF 03/14/2020  . Moderna SARS-COV2 Booster Vaccination 08/23/2019, 09/20/2019  . Moderna Sars-Covid-2 Vaccination 05/20/2020    TDAP status: Due, Education has been provided regarding the importance of this vaccine. Advised may receive this vaccine at local pharmacy or Health Dept. Aware to provide a copy of the vaccination record if obtained from local pharmacy or Health Dept. Verbalized acceptance and understanding.  Flu Vaccine status: Up to date  Pneumococcal vaccine status: Due, Education has been provided regarding the importance of this vaccine. Advised may receive this vaccine at local pharmacy or Health Dept. Aware to provide a copy of the vaccination record if obtained from local pharmacy or Health Dept. Verbalized acceptance and understanding.  Covid-19 vaccine status: Completed vaccines  Qualifies for Shingles Vaccine? Yes   Zostavax completed No   Shingrix Completed?: No.    Education has been provided regarding the importance of this vaccine. Patient has been advised to call insurance  company to determine out of pocket expense if they have not yet received this vaccine. Advised may also receive vaccine at local pharmacy or Health Dept. Verbalized acceptance and understanding.  Screening Tests Health Maintenance  Topic Date Due  . PNA  vac Low Risk Adult (1 of 2 - PCV13) Never done  . TETANUS/TDAP  07/15/2023 (Originally 07/05/1944)  . INFLUENZA VACCINE  Completed  . COVID-19 Vaccine  Completed  . HPV VACCINES  Aged Out    Health Maintenance  Health Maintenance Due  Topic Date Due  . PNA vac Low Risk Adult (1 of 2 - PCV13) Never done    Colorectal cancer screening: No longer required.   Lung Cancer Screening: (Low Dose CT Chest recommended if Age 45-80 years, 30 pack-year currently smoking OR have quit w/in 15 years.) does not qualify.   Additional Screening:  Hepatitis C Screening: does not qualify; Completed N/A  Vision Screening: Recommended annual ophthalmology exams for early detection of glaucoma and other disorders of the eye. Is the patient up to date with their annual eye exam?  No , has appointment scheduled for April Who is the provider or what is the name of the office in which the patient attends annual eye exams? [facility], Dr. Linton Flemings If pt is not established with a provider, would they like to be referred to a provider to establish care? No .   Dental Screening: Recommended annual dental exams for proper oral hygiene  Community Resource Referral / Chronic Care Management: CRR required this visit?  No   CCM required this visit?  No      Plan:     I have personally reviewed and noted the following in the patient's chart:   . Medical and social history . Use of alcohol, tobacco or illicit drugs  . Current medications and supplements . Functional ability and status . Nutritional status . Physical activity . Advanced directives . List of other physicians . Hospitalizations, surgeries, and ER visits in previous 12  months . Vitals . Screenings to include cognitive, depression, and falls . Referrals and appointments  In addition, I have reviewed and discussed with patient certain preventive protocols, quality metrics, and best practice recommendations. A written personalized care plan for preventive services as well as general preventive health recommendations were provided to patient.   Due to this being a telephonic visit, the after visit summary with patients personalized plan was offered to patient via office or my-chart. Patient preferred to pick up at office at next visit or via mychart.   Andrez Grime, LPN   08/09/[id number]

## 2020-07-15 ENCOUNTER — Other Ambulatory Visit: Payer: Self-pay

## 2020-07-15 ENCOUNTER — Other Ambulatory Visit (INDEPENDENT_AMBULATORY_CARE_PROVIDER_SITE_OTHER): Payer: Medicare Other

## 2020-07-15 DIAGNOSIS — E039 Hypothyroidism, unspecified: Secondary | ICD-10-CM

## 2020-07-15 DIAGNOSIS — Z13220 Encounter for screening for lipoid disorders: Secondary | ICD-10-CM | POA: Diagnosis not present

## 2020-07-15 DIAGNOSIS — N183 Chronic kidney disease, stage 3 unspecified: Secondary | ICD-10-CM | POA: Diagnosis not present

## 2020-07-15 LAB — MICROALBUMIN / CREATININE URINE RATIO
Creatinine,U: 56 mg/dL
Microalb Creat Ratio: 1.3 mg/g (ref 0.0–30.0)
Microalb, Ur: <0.7 mg/dL (ref 0.0–1.9)

## 2020-07-15 LAB — LIPID PANEL
Cholesterol: 197 mg/dL (ref 0–200)
HDL: 46.4 mg/dL (ref >39.00)
LDL Cholesterol: 125 mg/dL — ABNORMAL HIGH (ref 0–99)
NonHDL: 150.43
Total CHOL/HDL Ratio: 4
Triglycerides: 128 mg/dL (ref 0.0–149.0)
VLDL: 25.6 mg/dL (ref 0.0–40.0)

## 2020-07-15 LAB — CBC WITH DIFFERENTIAL/PLATELET
Basophils Absolute: 0 10*3/uL (ref 0.0–0.1)
Basophils Relative: 1.1 % (ref 0.0–3.0)
Eosinophils Absolute: 0.2 10*3/uL (ref 0.0–0.7)
Eosinophils Relative: 5.8 % — ABNORMAL HIGH (ref 0.0–5.0)
HCT: 46.2 % (ref 39.0–52.0)
Hemoglobin: 15.6 g/dL (ref 13.0–17.0)
Lymphocytes Relative: 42 % (ref 12.0–46.0)
Lymphs Abs: 1.5 10*3/uL (ref 0.7–4.0)
MCHC: 33.7 g/dL (ref 30.0–36.0)
MCV: 92.3 fl (ref 78.0–100.0)
Monocytes Absolute: 0.2 10*3/uL (ref 0.1–1.0)
Monocytes Relative: 6.6 % (ref 3.0–12.0)
Neutro Abs: 1.5 10*3/uL (ref 1.4–7.7)
Neutrophils Relative %: 44.5 % (ref 43.0–77.0)
Platelets: 164 10*3/uL (ref 150.0–400.0)
RBC: 5 Mil/uL (ref 4.22–5.81)
RDW: 14 % (ref 11.5–15.5)
WBC: 3.5 10*3/uL — ABNORMAL LOW (ref 4.0–10.5)

## 2020-07-15 LAB — TSH: TSH: 8.52 u[IU]/mL — ABNORMAL HIGH (ref 0.35–4.50)

## 2020-07-15 LAB — COMPREHENSIVE METABOLIC PANEL
ALT: 23 U/L (ref 0–53)
AST: 22 U/L (ref 0–37)
Albumin: 4.2 g/dL (ref 3.5–5.2)
Alkaline Phosphatase: 72 U/L (ref 39–117)
BUN: 15 mg/dL (ref 6–23)
CO2: 29 mEq/L (ref 19–32)
Calcium: 9.8 mg/dL (ref 8.4–10.5)
Chloride: 106 mEq/L (ref 96–112)
Creatinine, Ser: 1.1 mg/dL (ref 0.40–1.50)
GFR: 58.06 mL/min — ABNORMAL LOW (ref >60.00)
Glucose, Bld: 89 mg/dL (ref 70–99)
Potassium: 5.1 mEq/L (ref 3.5–5.1)
Sodium: 141 mEq/L (ref 135–145)
Total Bilirubin: 1 mg/dL (ref 0.2–1.2)
Total Protein: 7.3 g/dL (ref 6.0–8.3)

## 2020-07-15 LAB — VITAMIN D 25 HYDROXY (VIT D DEFICIENCY, FRACTURES): VITD: 25.09 ng/mL — ABNORMAL LOW (ref 30.00–100.00)

## 2020-07-15 NOTE — Addendum Note (Signed)
Addended by: Cloyd Stagers on: 07/15/2020 09:09 AM   Modules accepted: Orders

## 2020-07-16 ENCOUNTER — Other Ambulatory Visit (INDEPENDENT_AMBULATORY_CARE_PROVIDER_SITE_OTHER): Payer: Medicare Other

## 2020-07-16 DIAGNOSIS — R7989 Other specified abnormal findings of blood chemistry: Secondary | ICD-10-CM | POA: Diagnosis not present

## 2020-07-16 LAB — T4, FREE: Free T4: 0.82 ng/dL (ref 0.60–1.60)

## 2020-07-22 ENCOUNTER — Encounter: Payer: Self-pay | Admitting: Family Medicine

## 2020-07-22 ENCOUNTER — Other Ambulatory Visit: Payer: Self-pay

## 2020-07-22 ENCOUNTER — Ambulatory Visit (INDEPENDENT_AMBULATORY_CARE_PROVIDER_SITE_OTHER): Payer: Medicare Other | Admitting: Family Medicine

## 2020-07-22 VITALS — BP 136/78 | HR 65 | Temp 97.8°F | Ht 64.5 in | Wt 181.1 lb

## 2020-07-22 DIAGNOSIS — K5909 Other constipation: Secondary | ICD-10-CM

## 2020-07-22 DIAGNOSIS — R0981 Nasal congestion: Secondary | ICD-10-CM

## 2020-07-22 DIAGNOSIS — I2510 Atherosclerotic heart disease of native coronary artery without angina pectoris: Secondary | ICD-10-CM | POA: Diagnosis not present

## 2020-07-22 DIAGNOSIS — R300 Dysuria: Secondary | ICD-10-CM | POA: Diagnosis not present

## 2020-07-22 DIAGNOSIS — E559 Vitamin D deficiency, unspecified: Secondary | ICD-10-CM

## 2020-07-22 DIAGNOSIS — N1831 Chronic kidney disease, stage 3a: Secondary | ICD-10-CM

## 2020-07-22 DIAGNOSIS — N401 Enlarged prostate with lower urinary tract symptoms: Secondary | ICD-10-CM | POA: Diagnosis not present

## 2020-07-22 DIAGNOSIS — J449 Chronic obstructive pulmonary disease, unspecified: Secondary | ICD-10-CM

## 2020-07-22 DIAGNOSIS — R3912 Poor urinary stream: Secondary | ICD-10-CM

## 2020-07-22 DIAGNOSIS — R053 Chronic cough: Secondary | ICD-10-CM

## 2020-07-22 DIAGNOSIS — E039 Hypothyroidism, unspecified: Secondary | ICD-10-CM

## 2020-07-22 LAB — POC URINALSYSI DIPSTICK (AUTOMATED)
Bilirubin, UA: NEGATIVE
Blood, UA: NEGATIVE
Glucose, UA: NEGATIVE
Ketones, UA: NEGATIVE
Leukocytes, UA: NEGATIVE
Nitrite, UA: NEGATIVE
Protein, UA: NEGATIVE
Spec Grav, UA: 1.025 (ref 1.010–1.025)
Urobilinogen, UA: 0.2 E.U./dL
pH, UA: 6 (ref 5.0–8.0)

## 2020-07-22 MED ORDER — TAMSULOSIN HCL 0.4 MG PO CAPS: 0.4000 mg | ORAL_CAPSULE | Freq: Every day | ORAL | 3 refills | Status: DC

## 2020-07-22 MED ORDER — ATENOLOL 50 MG PO TABS: 50.0000 mg | ORAL_TABLET | Freq: Every day | ORAL | 3 refills | Status: DC

## 2020-07-22 MED ORDER — LEVOTHYROXINE SODIUM 50 MCG PO TABS: 50.0000 ug | ORAL_TABLET | Freq: Every evening | ORAL | 3 refills | Status: DC

## 2020-07-22 NOTE — Progress Notes (Signed)
Patient ID: Joseph Osborn, male    DOB: June 13, 1925, 87 y.o.   MRN: [id number]  This visit was conducted in person.  BP 136/78    Pulse 65    Temp 97.8 F (36.6 C) (Temporal)    Ht 5' 4.5" (1.638 m)    Wt 181 lb 1 oz (82.1 kg)    SpO2 95%    BMI 30.60 kg/m    CC: AMW f/u visit Subjective:   HPI: Joseph Osborn is a 87 y.o. male presenting on 07/22/2020 for Annual Exam (Pt accompanied by daughter, Joseph Osborn- temp 97.9.)   Saw health advisor last week for medicare wellness visit. Note reviewed.    No exam data present  Flowsheet Row Clinical Support from 07/14/2020 in [place]  PHQ-2 Total Score 2      Fall Risk  07/14/2020  Falls in the past year? 0  Number falls in past yr: 0  Injury with Fall? 0  Risk for fall due to : Medication side effect  Follow up Falls evaluation completed;Falls prevention discussed    Has bought plain mucinex he will try for ongoing sinus congestion.  Not using incruse ellipta - didn't see benefit.  Dysuria ongoing for months.   Preventative: Colon cancer screening - no blood in stool. Known hemorrhoids. iFOB normal 03/2020 Prostate cancer screening - BPH s/p TURP. Aged out. Ongoing LUTS despite flomax 0.4mg  daily  Lung cancer screening - aged out Flu shot - yearly  COVID vaccine Moderna 08/2019, 09/2019, booster 05/2020 Tetanus shot - unsure Pneumonia shot - still awaiting records Shingrix - still awaiting records Advanced directive discussion -  Seat belt use discussed Sunscreen use discussed. No changing moles on skin. Dentist -  Eye exam -  Smoking -  Alcohol  -  Constipation - ongoing despite linzess QOD. Poor water intake Bladder - see above (LUTS in h/o BPH)  Lives with wife, daughter Joseph Osborn) and her husband and son From [place]: retired, previously worked Armed forces logistics/support/administrative officer      Relevant past medical, surgical, family and social history reviewed and updated as indicated. Interim medical history since our last visit  reviewed. Allergies and medications reviewed and updated. Outpatient Medications Prior to Visit  Medication Sig Dispense Refill   linaclotide (LINZESS) 72 MCG capsule 72 mcg.     atenolol (TENORMIN) 50 MG tablet Take 50 mg by mouth daily.      tamsulosin (FLOMAX) 0.4 MG CAPS capsule Take 0.4 mg by mouth daily.     umeclidinium bromide (INCRUSE ELLIPTA) 62.5 MCG/INH AEPB Inhale 1 puff into the lungs daily. (Patient not taking: Reported on 07/22/2020) 30 each 6   No facility-administered medications prior to visit.     Per HPI unless specifically indicated in ROS section below Review of Systems Objective:  BP 136/78    Pulse 65    Temp 97.8 F (36.6 C) (Temporal)    Ht 5' 4.5" (1.638 m)    Wt 181 lb 1 oz (82.1 kg)    SpO2 95%    BMI 30.60 kg/m   Wt Readings from Last 3 Encounters:  07/22/20 181 lb 1 oz (82.1 kg)  05/18/20 183 lb 3 oz (83.1 kg)  03/17/20 182 lb 4 oz (82.7 kg)      Physical Exam Vitals and nursing note reviewed.  Constitutional:      General: He is not in acute distress.    Appearance: Normal appearance. He is well-developed. He is not ill-appearing.  HENT:     Head: Normocephalic and atraumatic.     Right Ear: Hearing, tympanic membrane, ear canal and external ear normal. There is impacted cerumen.     Left Ear: Hearing, tympanic membrane, ear canal and external ear normal. There is impacted cerumen.  Eyes:     General: No scleral icterus.    Extraocular Movements: Extraocular movements intact.     Conjunctiva/sclera: Conjunctivae normal.     Pupils: Pupils are equal, round, and reactive to light.  Neck:     Thyroid: No thyroid mass or thyromegaly.     Vascular: No carotid bruit.  Cardiovascular:     Rate and Rhythm: Normal rate and regular rhythm.     Pulses: Normal pulses.          Radial pulses are 2+ on the right side and 2+ on the left side.     Heart sounds: Normal heart sounds. No murmur heard.   Pulmonary:     Effort: Pulmonary effort is  normal. No respiratory distress.     Breath sounds: Normal breath sounds. No wheezing, rhonchi or rales.  Abdominal:     General: Bowel sounds are normal. There is no distension.     Palpations: Abdomen is soft. There is no mass.     Tenderness: There is no abdominal tenderness. There is no guarding or rebound.     Hernia: No hernia is present.  Musculoskeletal:        General: Normal range of motion.     Cervical back: Normal range of motion and neck supple.     Right lower leg: No edema.     Left lower leg: No edema.  Lymphadenopathy:     Cervical: No cervical adenopathy.  Skin:    General: Skin is warm and dry.     Findings: No rash.  Neurological:     General: No focal deficit present.     Mental Status: He is alert and oriented to person, place, and time.     Comments: CN grossly intact, station and gait intact  Psychiatric:        Mood and Affect: Mood normal.        Behavior: Behavior normal.        Thought Content: Thought content normal.        Judgment: Judgment normal.       Results for orders placed or performed in visit on 07/22/20  POCT Urinalysis Dipstick (Automated)  Result Value Ref Range   Color, UA yellow    Clarity, UA clear    Glucose, UA Negative Negative   Bilirubin, UA negative    Ketones, UA negative    Spec Grav, UA 1.025 1.010 - 1.025   Blood, UA negative    pH, UA 6.0 5.0 - 8.0   Protein, UA Negative Negative   Urobilinogen, UA 0.2 0.2 or 1.0 E.U./dL   Nitrite, UA negative    Leukocytes, UA Negative Negative   Assessment & Plan:  This visit occurred during the SARS-CoV-2 public health emergency.  Safety protocols were in place, including screening questions prior to the visit, additional usage of staff PPE, and extensive cleaning of exam room while observing appropriate contact time as indicated for disinfecting solutions.   Problem List Items Addressed This Visit    Benign prostatic hyperplasia    Continue flomax, consider finasteride.   S/p TURP.  Update UA today for dysuria.       Relevant Medications   tamsulosin (FLOMAX) 0.4  MG CAPS capsule   CAD (coronary artery disease)    Regularly sees cardiology.       Relevant Medications   atenolol (TENORMIN) 50 MG tablet   CKD (chronic kidney disease), stage III (HCC)    Recent kidney function stable with GFR 58.       COPD (chronic obstructive pulmonary disease) ([place])    He stopped incruse ellipta. Encouraged retrial.       Hypothyroidism    Elevated TSH, fT4 normal. However with ongoing fatigue and chronic constipation, will recommend trial of levothyroxine 21mcg daily. Recheck labs in 4-6 wks.       Relevant Medications   atenolol (TENORMIN) 50 MG tablet   levothyroxine (SYNTHROID) 50 MCG tablet   Other Relevant Orders   TSH   T4, free   Chronic cough    He stopped incruse ellipta.       Chronic constipation    Despite linzess QOD.  Encouraged increased water intake, increased fiber intake. Will trial thyroid replacement and monitor effect on constipation.       Chronic nasal congestion    Continue daily mucinex.       Dysuria - Primary    Check UA - normal. Not consistent with UTI      Relevant Orders   POCT Urinalysis Dipstick (Automated) (Completed)   Vitamin D deficiency    rec start vit D 1000 IU daily.           Meds ordered this encounter  Medications   atenolol (TENORMIN) 50 MG tablet    Sig: Take 1 tablet (50 mg total) by mouth daily.    Dispense:  90 tablet    Refill:  3   tamsulosin (FLOMAX) 0.4 MG CAPS capsule    Sig: Take 1 capsule (0.4 mg total) by mouth daily.    Dispense:  90 capsule    Refill:  3   levothyroxine (SYNTHROID) 50 MCG tablet    Sig: Take 1 tablet (50 mcg total) by mouth at bedtime.    Dispense:  30 tablet    Refill:  3   Cholecalciferol (VITAMIN D3) 25 MCG (1000 UT) CAPS    Sig: Take 1 capsule (1,000 Units total) by mouth daily.    Dispense:  30 capsule   Orders Placed This Encounter   Procedures   TSH    Standing Status:   Future    Standing Expiration Date:   07/29/2021   T4, free    Standing Status:   Future    Standing Expiration Date:   07/29/2021   POCT Urinalysis Dipstick (Automated)    Patient instructions: Urinalysis today.  [place] cantidad de agua que esta tomando. Comienze vitamina D3 1000 unidades diarias (sin receta). Pruebe levothyroxine 39mcg diarios de noche con vaso de agua.  Regresar en 6 semanas para repetir laboratorios. Dejeme saber si algun problema con medicina.  Regresar en 4-6 meses para proxima visita.   Follow up plan: Return in about 6 months (around 01/22/2021) for follow up visit.  Ria Bush, MD

## 2020-07-22 NOTE — Patient Instructions (Addendum)
Urinalysis today.  [place] cantidad de agua que esta tomando. Comienze vitamina D3 1000 unidades diarias (sin receta). Pruebe levothyroxine 4mcg diarios de noche con vaso de agua.  Regresar en 6 semanas para repetir laboratorios. Dejeme saber si algun problema con medicina.  Regresar en 4-6 meses para proxima visita.   Mantenimiento de la salud despus de los 31 aos de edad Health Maintenance After Age 85 Despus de los 65 aos de edad, corre un riesgo mayor de Tourist information centre manager enfermedades e infecciones a Barrister's clerk, como tambin de sufrir lesiones por cadas. Las cadas son la causa principal de las fracturas de huesos y lesiones en la cabeza de personas mayores de 55 aos de edad. Recibir cuidados preventivos de forma regular puede ayudarlo a mantenerse saludable y en buen [place]. Los cuidados preventivos incluyen realizarse anlisis de forma regular y Actor en el estilo de vida segn las recomendaciones del mdico. Converse con el profesional que lo asiste sobre:  Las pruebas de deteccin y los anlisis que debe Dispensing optician. Una prueba de deteccin es un estudio que se para Hydrographic surveyor la presencia de una enfermedad cuando no tiene sntomas.  Un plan de dieta y ejercicios adecuado para usted. Qu debo saber sobre las pruebas de deteccin y los anlisis para prevenir cadas? Realizarse pruebas de deteccin y [employer] es la mejor manera de Hydrographic surveyor un problema de salud de forma temprana. El diagnstico y tratamiento tempranos le brindan la mejor oportunidad de Chief Technology Officer las afecciones mdicas que son comunes despus de los 16 aos de edad. Ciertas afecciones y elecciones de estilo de vida pueden hacer que sea ms propenso a sufrir Engineer, manufacturing. El mdico puede recomendarle lo siguiente:  Controles regulares de la visin. Una visin deficiente y afecciones como las cataratas pueden hacer que sea ms propenso a sufrir Engineer, manufacturing. Si [place] lentes, asegrese de obtener una receta actualizada si su  visin cambia.  Revisin de medicamentos. Revise regularmente con el mdico todos los medicamentos que toma, incluidos los medicamentos de [place]. Consulte al [employer] efectos secundarios que pueden hacer que sea ms propenso a sufrir Engineer, manufacturing. Informe al mdico si alguno de los medicamentos que toma lo hace sentir mareado o somnoliento.  Pruebas de deteccin para la osteoporosis. La osteoporosis es una afeccin que hace que los huesos se vuelvan ms frgiles. En consecuencia, los huesos pueden debilitarse y quebrarse ms fcilmente.  Pruebas de deteccin para la presin arterial. Los cambios en la presin arterial y los medicamentos para Chief Technology Officer la presin arterial pueden hacerlo sentir mareado.  Controles de fuerza y equilibrio. El mdico puede recomendar ciertos estudios para controlar su fuerza y equilibrio al estar de pie, al caminar o al cambiar de posicin.  Examen de los pies. El dolor y Chiropractor en los pies, como tambin no utilizar el calzado [place], pueden hacer que sea ms propenso a sufrir Engineer, manufacturing.  Prueba de deteccin de la depresin. Es ms probable que sufra una cada si tiene miedo a caerse, se siente mal emocionalmente o se siente incapaz de Patent examiner.  Prueba de deteccin de consumo de alcohol. Beber demasiado alcohol puede afectar su equilibrio y puede hacer que sea ms propenso a sufrir Engineer, manufacturing. Qu medidas puedo tomar para reducir mi riesgo de sufrir una cada? Instrucciones generales  Hable con el mdico sobre sus riesgos de sufrir una cada. Infrmele a su mdico si: ? Se cae. Asegrese de informarle a su mdico acerca de todas las cadas,  incluso aquellas que parecen ser [employer] ? Se siente mareado, somnoliento o que pierde el equilibrio.  Tome los medicamentos de venta libre y los recetados solamente como se lo haya indicado el mdico. Estos incluyen todos los suplementos.  Siga una dieta sana y [place] un  peso saludable. Una dieta saludable incluye productos lcteos descremados, carnes bajas en contenido de grasa ([place], [place] de granos enteros, frijoles y [place] frutas y verduras. La seguridad en el hogar  Retire los objetos que puedan causar tropiezos tales como alfombras, cables u obstculos.  Instale equipos de seguridad, como barras para sostn en los baos y barandas de seguridad en las escaleras.  Noma habitaciones y los pasillos bien iluminados. Actividad  Siga un programa de ejercicio regular para mantenerse en forma. Esto lo ayudar a Contractor equilibrio. Consulte al mdico qu tipos de ejercicios son adecuados para usted.  Si necesita un bastn o un andador, selo segn las recomendaciones del mdico.  Utilice calzado con buen apoyo y suela antideslizante.   Estilo de vida  No beba alcohol si el mdico le indica que no beba.  Si bebe alcohol, limite la cantidad que consume: ? De 0 a 1 medida por da para las mujeres. ? De 0 a 2 medidas por da para los hombres.  Est atento a la cantidad de alcohol que contiene su bebida. En los [place] una medida equivale a una botella tpica de cerveza (12 onzas), media copa de vino (5 onzas) o una medida de bebida blanca (1 onza).  No consuma ningn producto que contenga nicotina o tabaco, como cigarrillos y Psychologist, sport and exercise. Si necesita ayuda para dejar de fumar, consulte al mdico. Resumen  Tener un estilo de vida saludable y recibir cuidados preventivos pueden ayudar a Theatre stage manager salud y el bienestar despus de los 81 aos de [place].  Realizarse pruebas de deteccin y [employer] es la mejor manera de Hydrographic surveyor un problema de salud de forma temprana y Lourena Simmonds a Product/process development scientist una cada. El diagnstico y tratamiento tempranos le brindan la mejor oportunidad de Chief Technology Officer las afecciones mdicas ms comunes en las personas mayores de 83 aos de edad.  Las cadas son la causa principal de las fracturas de huesos y lesiones en la  cabeza de personas mayores de 67 aos de edad. Tome precauciones para evitar una cada en su casa.  Trabaje con el mdico para saber qu cambios que puede hacer para mejorar su salud y [place] [employer], y [place]. Esta informacin no tiene Marine scientist el consejo del mdico. Asegrese de hacerle al mdico cualquier pregunta que tenga. Document Revised: 06/08/2017 Document Reviewed: 06/08/2017 Elsevier Patient Education  2021 [employer].

## 2020-07-28 DIAGNOSIS — R300 Dysuria: Secondary | ICD-10-CM | POA: Insufficient documentation

## 2020-07-28 NOTE — Assessment & Plan Note (Signed)
Check UA - normal. Not consistent with UTI

## 2020-07-29 DIAGNOSIS — E559 Vitamin D deficiency, unspecified: Secondary | ICD-10-CM | POA: Insufficient documentation

## 2020-07-29 MED ORDER — VITAMIN D3 25 MCG (1000 UT) PO CAPS: 1.0000 | ORAL_CAPSULE | Freq: Every day | ORAL | Status: DC

## 2020-07-29 NOTE — Assessment & Plan Note (Addendum)
Despite linzess QOD.  Encouraged increased water intake, increased fiber intake. Will trial thyroid replacement and monitor effect on constipation.

## 2020-07-29 NOTE — Assessment & Plan Note (Signed)
Continue flomax, consider finasteride.  S/p TURP.  Update UA today for dysuria.

## 2020-07-29 NOTE — Assessment & Plan Note (Signed)
Recent kidney function stable with GFR 58.

## 2020-07-29 NOTE — Assessment & Plan Note (Signed)
Regularly sees cardiology 

## 2020-07-29 NOTE — Assessment & Plan Note (Signed)
Continue daily mucinex.

## 2020-07-29 NOTE — Assessment & Plan Note (Signed)
He stopped incruse ellipta. Encouraged retrial.

## 2020-07-29 NOTE — Assessment & Plan Note (Signed)
Elevated TSH, fT4 normal. However with ongoing fatigue and chronic constipation, will recommend trial of levothyroxine 59mcg daily. Recheck labs in 4-6 wks.

## 2020-07-29 NOTE — Assessment & Plan Note (Signed)
He stopped incruse ellipta.

## 2020-07-29 NOTE — Assessment & Plan Note (Signed)
rec start vit D 1000 IU daily.  

## 2020-08-19 ENCOUNTER — Other Ambulatory Visit: Payer: Medicare Other

## 2020-08-20 ENCOUNTER — Other Ambulatory Visit (INDEPENDENT_AMBULATORY_CARE_PROVIDER_SITE_OTHER): Payer: Medicare Other

## 2020-08-20 ENCOUNTER — Other Ambulatory Visit: Payer: Self-pay

## 2020-08-20 DIAGNOSIS — E039 Hypothyroidism, unspecified: Secondary | ICD-10-CM

## 2020-08-20 LAB — T4, FREE: Free T4: 1.06 ng/dL (ref 0.60–1.60)

## 2020-08-20 LAB — TSH: TSH: 4.14 u[IU]/mL (ref 0.35–4.50)

## 2020-11-23 ENCOUNTER — Ambulatory Visit: Payer: Medicare Other | Admitting: Family Medicine

## 2020-11-27 ENCOUNTER — Ambulatory Visit (INDEPENDENT_AMBULATORY_CARE_PROVIDER_SITE_OTHER): Payer: Medicare Other | Admitting: Family Medicine

## 2020-11-27 ENCOUNTER — Other Ambulatory Visit: Payer: Self-pay

## 2020-11-27 ENCOUNTER — Encounter: Payer: Self-pay | Admitting: Family Medicine

## 2020-11-27 VITALS — BP 130/72 | HR 65 | Temp 97.4°F | Ht 64.5 in | Wt 176.4 lb

## 2020-11-27 DIAGNOSIS — E039 Hypothyroidism, unspecified: Secondary | ICD-10-CM | POA: Diagnosis not present

## 2020-11-27 DIAGNOSIS — I2510 Atherosclerotic heart disease of native coronary artery without angina pectoris: Secondary | ICD-10-CM | POA: Diagnosis not present

## 2020-11-27 DIAGNOSIS — H9193 Unspecified hearing loss, bilateral: Secondary | ICD-10-CM

## 2020-11-27 DIAGNOSIS — R0981 Nasal congestion: Secondary | ICD-10-CM | POA: Diagnosis not present

## 2020-11-27 DIAGNOSIS — J449 Chronic obstructive pulmonary disease, unspecified: Secondary | ICD-10-CM

## 2020-11-27 DIAGNOSIS — C44319 Basal cell carcinoma of skin of other parts of face: Secondary | ICD-10-CM | POA: Insufficient documentation

## 2020-11-27 DIAGNOSIS — K5909 Other constipation: Secondary | ICD-10-CM | POA: Diagnosis not present

## 2020-11-27 DIAGNOSIS — R140 Abdominal distension (gaseous): Secondary | ICD-10-CM | POA: Diagnosis not present

## 2020-11-27 DIAGNOSIS — H6123 Impacted cerumen, bilateral: Secondary | ICD-10-CM

## 2020-11-27 DIAGNOSIS — L988 Other specified disorders of the skin and subcutaneous tissue: Secondary | ICD-10-CM

## 2020-11-27 MED ORDER — FEXOFENADINE HCL 180 MG PO TABS: 180.0000 mg | ORAL_TABLET | Freq: Every day | ORAL | Status: DC

## 2020-11-27 MED ORDER — LEVOTHYROXINE SODIUM 50 MCG PO TABS: 50.0000 ug | ORAL_TABLET | Freq: Every evening | ORAL | 6 refills | Status: DC

## 2020-11-27 MED ORDER — LINACLOTIDE 72 MCG PO CAPS: 72.0000 ug | ORAL_CAPSULE | ORAL | 1 refills | Status: DC

## 2020-11-27 NOTE — Assessment & Plan Note (Signed)
Does not like INS.  Today with evidence of sinus inflmamation  Will trial daily antihistamine (Alegra) for a few weeks, update with effect.

## 2020-11-27 NOTE — Assessment & Plan Note (Signed)
Ongoing. Lactose free diet hasn't seemed to help. Consider starting pepcid nightly vs probiotic.

## 2020-11-27 NOTE — Assessment & Plan Note (Signed)
Ongoing, managed well with linzess every few days.

## 2020-11-27 NOTE — Assessment & Plan Note (Signed)
No benefit with incruse ellipta - will stop

## 2020-11-27 NOTE — Patient Instructions (Addendum)
Comienze Allegra antihistaminico diario para alergias - trate esto por 2-3 semanas a ver si ayuda los sintomas de lagrimas en los ojos y congestion nasal.  Irrigacion de oidos hoy Lo remitiremos a Acupuncturist y Risk analyst

## 2020-11-27 NOTE — Assessment & Plan Note (Signed)
Irregular macule to left inferior facial cheek - longstanding present per pt/daughter.  Concern for atypical nevus - will refer to dermatology for further evaluation.

## 2020-11-27 NOTE — Progress Notes (Addendum)
Patient ID: Joseph Osborn, male    DOB: 04/15/1926, 87 y.o.   MRN: [id number]  This visit was conducted in person.  BP 130/72   Pulse 65   Temp (!) 97.4 F (36.3 C) (Temporal)   Ht 5' 4.5" (1.638 m)   Wt 176 lb 6 oz (80 kg)   SpO2 96%   BMI 29.81 kg/m    CC: f/u visit  Subjective:   HPI: Joseph Osborn is a 87 y.o. male presenting on 11/27/2020 for Follow-up (Here for 4-6 mo f/u.  Pt accompanied by daughter, [name]- temp 97.7.)   Hypothyroidism - last visit we started levothyroxine 33mg daily for elevated TSH with normal fT4 and endorsed fatigue and chronic constipation. TFTs stable. He stopped this concerned it was worsening mucous production.   Chronic constipation despite linzess QOD. We previously discussed increased water and fiber in diet.   BPH s/p TURP with ongoing dysuria - UA normal last visit.   Ongoing hearing loss as well as L ear itching. Agrees to audiology evaluation.   Ongoing chronic rhinorrhea with white mucous worse with any meals. Eyes stay watery. No significant congestion.   COPD - no benefit with incruse ellipta so he stopped this.   Notes ongoing gassiness. Did not improve with trial lactose free diet.      Relevant past medical, surgical, family and social history reviewed and updated as indicated. Interim medical history since our last visit reviewed. Allergies and medications reviewed and updated. Outpatient Medications Prior to Visit  Medication Sig Dispense Refill   atenolol (TENORMIN) 50 MG tablet Take 1 tablet (50 mg total) by mouth daily. 90 tablet 3   tamsulosin (FLOMAX) 0.4 MG CAPS capsule Take 1 capsule (0.4 mg total) by mouth daily. 90 capsule 3   linaclotide (LINZESS) 72 MCG capsule 72 mcg.     Cholecalciferol (VITAMIN D3) 25 MCG (1000 UT) CAPS Take 1 capsule (1,000 Units total) by mouth daily. (Patient not taking: Reported on 11/27/2020) 30 capsule    levothyroxine (SYNTHROID) 50 MCG tablet Take 1 tablet (50 mcg total) by mouth at bedtime.  (Patient not taking: Reported on 11/27/2020) 30 tablet 3   umeclidinium bromide (INCRUSE ELLIPTA) 62.5 MCG/INH AEPB Inhale 1 puff into the lungs daily. (Patient not taking: No sig reported) 30 each 6   No facility-administered medications prior to visit.     Per HPI unless specifically indicated in ROS section below Review of Systems  Objective:  BP 130/72   Pulse 65   Temp (!) 97.4 F (36.3 C) (Temporal)   Ht 5' 4.5" (1.638 m)   Wt 176 lb 6 oz (80 kg)   SpO2 96%   BMI 29.81 kg/m   Wt Readings from Last 3 Encounters:  11/27/20 176 lb 6 oz (80 kg)  07/22/20 181 lb 1 oz (82.1 kg)  05/18/20 183 lb 3 oz (83.1 kg)      Physical Exam Vitals and nursing note reviewed.  Constitutional:      Appearance: Normal appearance. He is not ill-appearing.  HENT:     Head: Normocephalic and atraumatic.     Right Ear: Ear canal normal. Decreased hearing noted. There is impacted cerumen.     Left Ear: Ear canal normal. Decreased hearing noted. There is impacted cerumen.     Ears:     Comments:  Pt consented to have procedure done Irrigation then cerumen disimpaction performed successfully on right Irrigation then cerumen disimpaction attempted on left, cerumen impaction remains Patient tolerated  procedure well.     Nose: Mucosal edema and congestion present.     Right Turbinates: Swollen and pale. Not enlarged.     Left Turbinates: Swollen and pale. Not enlarged.     Mouth/Throat:     Mouth: Mucous membranes are moist.     Pharynx: No oropharyngeal exudate or posterior oropharyngeal erythema.     Comments: White PNdrainage present Eyes:     Extraocular Movements: Extraocular movements intact.     Conjunctiva/sclera: Conjunctivae normal.     Pupils: Pupils are equal, round, and reactive to light.  Cardiovascular:     Rate and Rhythm: Normal rate and regular rhythm.     Pulses: Normal pulses.     Heart sounds: Normal heart sounds. No murmur heard. Pulmonary:     Effort: Pulmonary  effort is normal. No respiratory distress.     Breath sounds: Normal breath sounds. No wheezing, rhonchi or rales.  Skin:    General: Skin is warm and dry.     Findings: No rash.     Comments: Irregular pigmented macular lesion to left cheek  Neurological:     Mental Status: He is alert.  Psychiatric:        Mood and Affect: Mood normal.        Behavior: Behavior normal.      Results for orders placed or performed in visit on 08/20/20  T4, free  Result Value Ref Range   Free T4 1.06 0.60 - 1.60 ng/dL  TSH  Result Value Ref Range   TSH 4.14 0.35 - 4.50 uIU/mL    Assessment & Plan:  This visit occurred during the SARS-CoV-2 public health emergency.  Safety protocols were in place, including screening questions prior to the visit, additional usage of staff PPE, and extensive cleaning of exam room while observing appropriate contact time as indicated for disinfecting solutions.   Problem List Items Addressed This Visit     COPD (chronic obstructive pulmonary disease) (Industry)    No benefit with incruse ellipta - will stop        Relevant Medications   fexofenadine (ALLEGRA ALLERGY) 180 MG tablet   Borderline hypothyroidism    Reviewed with patient and daughter.  Unsure if levothyroxine helped energy levels or chronic constipation - they will retry for a few months and monitor effect on these symptoms. If not helpful, ok to stop.        Relevant Medications   levothyroxine (SYNTHROID) 50 MCG tablet   Chronic constipation    Ongoing, managed well with linzess every few days.        Chronic nasal congestion - Primary    Does not like INS.  Today with evidence of sinus inflmamation  Will trial daily antihistamine (Alegra) for a few weeks, update with effect.        Gassiness    Ongoing. Lactose free diet hasn't seemed to help. Consider starting pepcid nightly vs probiotic.       Bilateral hearing loss    They agree to audiology evaluation - will start with ENT for  cerumen removal to left ear.        Relevant Orders   Ambulatory referral to ENT   Skin macule    Irregular macule to left inferior facial cheek - longstanding present per pt/daughter.  Concern for atypical nevus - will refer to dermatology for further evaluation.        Relevant Orders   Ambulatory referral to Dermatology   Other Visit Diagnoses  Bilateral impacted cerumen       Relevant Orders   Ambulatory referral to ENT        Meds ordered this encounter  Medications   fexofenadine (ALLEGRA ALLERGY) 180 MG tablet    Sig: Take 1 tablet (180 mg total) by mouth daily.   levothyroxine (SYNTHROID) 50 MCG tablet    Sig: Take 1 tablet (50 mcg total) by mouth at bedtime.    Dispense:  30 tablet    Refill:  6   linaclotide (LINZESS) 72 MCG capsule    Sig: Take 1 capsule (72 mcg total) by mouth every other day.    Dispense:  45 capsule    Refill:  1   Orders Placed This Encounter  Procedures   Ambulatory referral to ENT    Referral Priority:   Routine    Referral Type:   Consultation    Referral Reason:   Specialty Services Required    Requested Specialty:   Otolaryngology    Number of Visits Requested:   1   Ambulatory referral to Dermatology    Referral Priority:   Routine    Referral Type:   Consultation    Referral Reason:   Specialty Services Required    Requested Specialty:   Dermatology    Number of Visits Requested:   1     Patient Instructions  Comienze Allegra antihistaminico diario para alergias - trate esto por 2-3 semanas a ver si ayuda los sintomas de lagrimas en los ojos y congestion nasal.  Irrigacion de oidos hoy Lo remitiremos a Acupuncturist y Risk analyst  Follow up plan: Return in about 8 months (around 07/28/2021) for medicare wellness visit.  Ria Bush, MD

## 2020-11-27 NOTE — Assessment & Plan Note (Addendum)
Reviewed with patient and daughter.  Unsure if levothyroxine helped energy levels or chronic constipation - they will retry for a few months and monitor effect on these symptoms. If not helpful, ok to stop.

## 2020-11-27 NOTE — Assessment & Plan Note (Signed)
They agree to audiology evaluation - will start with ENT for cerumen removal to left ear.

## 2021-01-28 ENCOUNTER — Ambulatory Visit: Payer: Medicare Other | Admitting: Family Medicine

## 2021-02-01 ENCOUNTER — Ambulatory Visit (INDEPENDENT_AMBULATORY_CARE_PROVIDER_SITE_OTHER): Payer: Medicare Other | Admitting: Dermatology

## 2021-02-01 ENCOUNTER — Other Ambulatory Visit: Payer: Self-pay

## 2021-02-01 DIAGNOSIS — C44519 Basal cell carcinoma of skin of other part of trunk: Secondary | ICD-10-CM

## 2021-02-01 DIAGNOSIS — I2510 Atherosclerotic heart disease of native coronary artery without angina pectoris: Secondary | ICD-10-CM

## 2021-02-01 DIAGNOSIS — L304 Erythema intertrigo: Secondary | ICD-10-CM

## 2021-02-01 DIAGNOSIS — L820 Inflamed seborrheic keratosis: Secondary | ICD-10-CM | POA: Diagnosis not present

## 2021-02-01 DIAGNOSIS — D492 Neoplasm of unspecified behavior of bone, soft tissue, and skin: Secondary | ICD-10-CM

## 2021-02-01 NOTE — Patient Instructions (Addendum)
Wound Care Instructions Left groin area  Cleanse wound gently with soap and water once a day then pat dry with clean gauze. Apply a thing coat of Petrolatum (petroleum jelly, "Vaseline") over the wound (unless you have an allergy to this). We recommend that you use a new, sterile tube of Vaseline. Do not pick or remove scabs. Do not remove the yellow or white "healing tissue" from the base of the wound.  Cover the wound with fresh, clean, nonstick gauze and secure with paper tape. You may use Band-Aids in place of gauze and tape if the would is small enough, but would recommend trimming much of the tape off as there is often too much. Sometimes Band-Aids can irritate the skin.  You should call the office for your biopsy report after 1 week if you have not already been contacted.  If you experience any problems, such as abnormal amounts of bleeding, swelling, significant bruising, significant pain, or evidence of infection, please call the office immediately.  FOR ADULT SURGERY PATIENTS: If you need something for pain relief you may take 1 extra strength Tylenol (acetaminophen) AND 2 Ibuprofen (200mg  each) together every 4 hours as needed for pain. (do not take these if you are allergic to them or if you have a reason you should not take them.) Typically, you may only need pain medication for 1 to 3 days.        Cryotherapy Aftercare Left cheek  Wash gently with soap and water everyday.   Apply Vaseline and Band-Aid daily until healed.     If you have any questions or concerns for your doctor, please call our main line at [phone] and press option 4 to reach your doctor's medical assistant. If no one answers, please leave a voicemail as directed and we will return your call as soon as possible. Messages left after 4 pm will be answered the following business day.   You may also send [place] a message via [place]. We typically respond to MyChart messages within 1-2 business days.  For  prescription refills, please ask your pharmacy to contact our office. Our fax number is [phone].  If you have an urgent issue when the clinic is closed that cannot wait until the next business day, you can page your doctor at the number below.    Please note that while we do our best to be available for urgent issues outside of office hours, we are not available 24/7.   If you have an urgent issue and are unable to reach [place], you may choose to seek medical care at your doctor's office, retail clinic, urgent care center, or emergency room.  If you have a medical emergency, please immediately call 911 or go to the emergency department.  Pager Numbers  - Dr. Nehemiah Massed: [phone]  - Dr. Laurence Ferrari: [phone]  - Dr. Nicole Kindred: [phone]  In the event of inclement weather, please call our main line at [phone] for an update on the status of any delays or closures.  Dermatology Medication Tips: Please keep the boxes that topical medications come in in order to help keep track of the instructions about where and how to use these. Pharmacies typically print the medication instructions only on the boxes and not directly on the medication tubes.   If your medication is too expensive, please contact our office at [phone] option 4 or send [place] a message through [place].   We are unable to tell what your co-pay for medications will be in advance  as this is different depending on your insurance coverage. However, we may be able to find a substitute medication at lower cost or fill out paperwork to get insurance to cover a needed medication.   If a prior authorization is required to get your medication covered by your insurance company, please allow [place] 1-2 business days to complete this process.  Drug prices often vary depending on where the prescription is filled and some pharmacies may offer cheaper prices.  The website www.goodrx.com contains coupons for medications through different  pharmacies. The prices here do not account for what the cost may be with help from insurance (it may be cheaper with your insurance), but the website can give you the price if you did not use any insurance.  - You can print the associated coupon and take it with your prescription to the pharmacy.  - You may also stop by our office during regular business hours and pick up a GoodRx coupon card.  - If you need your prescription sent electronically to a different pharmacy, notify our office through [facility] or by phone at [phone] option 4.

## 2021-02-01 NOTE — Progress Notes (Signed)
   New Patient Visit  Subjective  Joseph Osborn is a 87 y.o. male who presents for the following: Skin Problem (Check a spot on the left cheek changing color ). Check a rash in the groin area, treating with Desitin cream with a poor response, pt think this rash is coming from his underwear.   Daughter with pt translating   The following portions of the chart were reviewed this encounter and updated as appropriate:   Tobacco  Allergies  Meds  Problems  Med Hx  Surg Hx  Fam Hx     Review of Systems:  No other skin or systemic complaints except as noted in HPI or Assessment and Plan.  Objective  Well appearing patient in no apparent distress; mood and affect are within normal limits.  A focused examination was performed including face, left inner thigh, right inner thigh . Relevant physical exam findings are noted in the Assessment and Plan.  left preauricular 0.8 x 0.5 cm crusted papule         groin Mild erythema   left groin 2.2 x 0.9 cm brown plaque         Assessment & Plan  Inflamed seborrheic keratosis left preauricular  Recheck in 2 months   Destruction of lesion - left preauricular Complexity: simple   Destruction method: cryotherapy   Informed consent: discussed and consent obtained   Timeout:  patient name, date of birth, surgical site, and procedure verified Lesion destroyed using liquid nitrogen: Yes   Region frozen until ice ball extended beyond lesion: Yes   Outcome: patient tolerated procedure well with no complications   Post-procedure details: wound care instructions given    Erythema intertrigo groin  Intertrigo is a chronic recurrent rash that occurs in skin fold areas that may be associated with friction; heat; moisture; yeast; fungus; and bacteria.  It is exacerbated by increased movement / activity; sweating; and higher atmospheric temperature.   Start Skin medicinals intertrigo cream apply to rash in the groin bid  Iodoquinol:  1% Hydrocortisone: 2.5% Niacinamide: 2% Vehicle: Cream  Neoplasm of skin left groin  Skin / nail biopsy Type of biopsy: tangential   Informed consent: discussed and consent obtained   Timeout: patient name, date of birth, surgical site, and procedure verified   Procedure prep:  Patient was prepped and draped in usual sterile fashion Prep type:  Isopropyl alcohol Anesthesia: the lesion was anesthetized in a standard fashion   Anesthetic:  1% lidocaine w/ epinephrine 1-100,000 buffered w/ 8.4% NaHCO3 Instrument used: flexible razor blade   Hemostasis achieved with: aluminum chloride   Outcome: patient tolerated procedure well   Post-procedure details: wound care instructions given   Additional details:  Petrolatum and a pressure bandage applied  Specimen 1 - Surgical pathology Differential Diagnosis: ISK R/O Skin cancer   Check Margins: No  Isk R/O CA   Return in about 2 months (around 04/03/2021) for ISK, rash .  IMarye Round, CMA, am acting as scribe for Sarina Ser, MD .  Documentation: I have reviewed the above documentation for accuracy and completeness, and I agree with the above.  Sarina Ser, MD

## 2021-02-02 ENCOUNTER — Ambulatory Visit: Payer: Medicare Other | Admitting: Family Medicine

## 2021-02-04 ENCOUNTER — Encounter: Payer: Self-pay | Admitting: Dermatology

## 2021-02-04 DIAGNOSIS — C4491 Basal cell carcinoma of skin, unspecified: Secondary | ICD-10-CM

## 2021-02-04 HISTORY — DX: Basal cell carcinoma of skin, unspecified: C44.91

## 2021-02-05 ENCOUNTER — Telehealth: Payer: Self-pay

## 2021-02-05 NOTE — Telephone Encounter (Signed)
Left message on voicemail to return my call.  

## 2021-02-05 NOTE — Telephone Encounter (Signed)
-----   Message from Ralene Bathe, MD sent at 02/04/2021  2:20 PM EDT ----- Diagnosis Skin , left groin BASAL CELL CARCINOMA, NODULAR PATTERN  Cancer - [facility] Schedule surgery

## 2021-02-08 ENCOUNTER — Telehealth: Payer: Self-pay

## 2021-02-08 NOTE — Telephone Encounter (Signed)
-----   Message from Ralene Bathe, MD sent at 02/04/2021  2:20 PM EDT ----- Diagnosis Skin , left groin BASAL CELL CARCINOMA, NODULAR PATTERN  Cancer - [facility] Schedule surgery

## 2021-02-08 NOTE — Telephone Encounter (Signed)
Patient's daughter [name], on HIPPA) came into office. Dicussed BX results and scheduled for surgery.

## 2021-02-16 ENCOUNTER — Other Ambulatory Visit: Payer: Self-pay

## 2021-02-16 ENCOUNTER — Ambulatory Visit (INDEPENDENT_AMBULATORY_CARE_PROVIDER_SITE_OTHER): Payer: Medicare Other | Admitting: Family Medicine

## 2021-02-16 ENCOUNTER — Ambulatory Visit (INDEPENDENT_AMBULATORY_CARE_PROVIDER_SITE_OTHER): Payer: Medicare Other

## 2021-02-16 ENCOUNTER — Encounter: Payer: Self-pay | Admitting: Family Medicine

## 2021-02-16 VITALS — BP 122/74 | HR 62 | Temp 97.7°F | Ht 64.5 in | Wt 177.1 lb

## 2021-02-16 DIAGNOSIS — Z23 Encounter for immunization: Secondary | ICD-10-CM

## 2021-02-16 DIAGNOSIS — M545 Low back pain, unspecified: Secondary | ICD-10-CM

## 2021-02-16 DIAGNOSIS — R300 Dysuria: Secondary | ICD-10-CM | POA: Diagnosis not present

## 2021-02-16 DIAGNOSIS — I2510 Atherosclerotic heart disease of native coronary artery without angina pectoris: Secondary | ICD-10-CM

## 2021-02-16 DIAGNOSIS — M5442 Lumbago with sciatica, left side: Secondary | ICD-10-CM | POA: Insufficient documentation

## 2021-02-16 LAB — POC URINALSYSI DIPSTICK (AUTOMATED)
Bilirubin, UA: NEGATIVE
Blood, UA: NEGATIVE
Glucose, UA: NEGATIVE
Ketones, UA: NEGATIVE
Nitrite, UA: NEGATIVE
Protein, UA: NEGATIVE
Spec Grav, UA: 1.015 (ref 1.010–1.025)
Urobilinogen, UA: 0.2 E.U./dL
pH, UA: 6 (ref 5.0–8.0)

## 2021-02-16 MED ORDER — PREDNISONE 20 MG PO TABS: ORAL_TABLET | ORAL | 0 refills | Status: DC

## 2021-02-16 NOTE — Patient Instructions (Addendum)
Rayos x de espalda [place] que tiene inflamacion del nervio ciatico.  Tome prednisona 2 pastillas por 3 dias y despues 1 pastilla por 3 dias.  Haga ejercicios que le dare.  Si no esta mejorando dejeme saber para remision a terapia fisica.  Tambien he [place] para cultura - lo llamaremos con [employer].   Citica Sciatica La citica es Conservation officer, historic buildings, entumecimiento, debilidad u hormigueo a lo largo del nervio citico. El nervio citico comienza en la parte inferior de la espalda y desciende por la parte posterior de cada pierna. Controla los msculos en la parte inferior de las piernas y en la parte posterior de las rodillas. Tambin proporciona sensibilidad a la parte posterior de los muslos, la parte inferior de las piernas y la planta de los pies. La citica es un sntoma de otra afeccin que ejerce presin o "pellizca" el nervio citico. Con mayor frecuencia, la citica afecta a un solo lado del cuerpo. Suele desaparecer por s sola o con tratamiento. En algunos casos, la Geographical information systems officer (ser recurrente). Cules son las causas? Esta afeccin es causada por una presin sobre el nervio citico o "pellizco" del nervio citico. Esto puede ser el resultado de: Un disco que sobresale demasiado entre los huesos de la columna vertebral (hernia de disco). Cambios en los discos vertebrales relacionados con la edad. Un trastorno doloroso que afecta un msculo de las nalgas. Un crecimiento seo adicional cerca del nervio citico. Una rotura (fractura) de la pelvis. Embarazo. Tumor. Esto es poco frecuente. Qu incrementa el riesgo? Los siguientes factores pueden hacer que sea ms propenso a Armed forces training and education officer afeccin: Careers information officer que ponen presin o tensin sobre la columna vertebral. Tener poca fuerza y flexibilidad. Antecedentes de ciruga o lesin en la espalda. Estar sentado durante largos perodos de [place]. Realizar actividades que requieren agacharse o levantar objetos en forma  repetida. Obesidad. Cules son los signos o los sntomas? Los sntomas pueden ser leves o graves, y pueden incluir los siguientes: Cualquiera de los siguientes problemas en la parte inferior de la espalda, piernas, cadera o nalgas: Hormigueo leve, adormecimiento o dolor sordo. Sensacin de ardor. Dolor agudo. Adormecimiento de la parte posterior de la pantorrilla o la planta del pie. Debilidad en las piernas. Dolor de espalda intenso que dificulta el movimiento. Los sntomas podran empeorar al toser, Brewing technologist o rerse, o cuando se est sentado o de pie durante perodos prolongados. Cmo se diagnostica? Esta afeccin se puede diagnosticar en funcin de lo siguiente: Los sntomas y los antecedentes mdicos. Un examen fsico. Anlisis de sangre. Pruebas de diagnstico por imgenes, por ejemplo: Radiografas. Resonancia magntica (RM). Exploracin por tomografa computarizada (TC). Cmo se trata? En muchos casos, esta afeccin mejora por s sola, sin tratamiento. Sin embargo, el tratamiento puede incluir: Reduccin o modificacin la actividad fsica. Ejercicios y estiramientos. Aplicacin de calor o hielo en la zona afectada. Medicamentos para lo siguiente: Best boy y la inflamacin. Relajar los msculos. Medicamentos inyectables que ayudan a Best boy, la irritacin y la inflamacin alrededor del nervio citico (corticoesteroides). Ciruga. Siga estas instrucciones en su casa: Medicamentos Tome los medicamentos de venta libre y los recetados solamente como se lo haya indicado el mdico. Pregntele al mdico si el medicamento recetado: Hace que sea necesario que evite conducir o usar maquinaria pesada. Puede causarle estreimiento. Es posible que tenga que tomar estas medidas para prevenir o tratar el estreimiento: Electronics engineer suficiente lquido como para Theatre manager la orina de color amarillo plido. Tomar medicamentos recetados o de venta  libre. Consumir alimentos  ricos en fibra, como frijoles, cereales integrales, y frutas y verduras frescas. Limitar el consumo de alimentos ricos en grasa y azcares procesados, como los alimentos fritos o dulces. Control del dolor   Si se lo indican, aplique hielo sobre la zona afectada. Ponga el hielo en una bolsa plstica. Coloque una toalla entre la piel y Therapist, nutritional. Coloque el hielo durante 20 minutos, 2 a 3 veces por da. Si se lo indican, aplique calor en la zona afectada. Use la fuente de calor que el mdico le recomiende, como una compresa de calor hmedo o una almohadilla trmica. Coloque una toalla entre la piel y la fuente de Freight forwarder. Aplique calor durante 20 a 30 minutos. Retire la fuente de calor si la piel se pone de color rojo brillante. Esto es especialmente importante si no puede sentir dolor, calor o fro. Puede correr un riesgo mayor de sufrir quemaduras. Actividad  Retome sus actividades normales segn lo indicado por el mdico. Pregntele al mdico qu actividades son seguras para usted. Evite las [employer] sntomas. Durante el da, descanse durante lapsos breves. Cuando descanse durante perodos ms largos, incorpore alguna [place] o ejercicios de [employer] perodos de descanso. Esto ayudar a Mining engineer rigidez y Conservation officer, historic buildings. Evite estar sentado durante largos perodos de tiempo sin moverse. Levntese y [place] al menos una vez cada hora. Haga ejercicio y elongue habitualmente, como se lo haya indicado el mdico. No levante nada que pese ms de 10 libras (4.5 kg) mientras tenga sntomas de citica. Aunque no tenga sntomas, evite levantar objetos pesados, en especial en forma repetida. Siempre use las tcnicas de levantamiento correctas para levantar objetos, entre ellas: Flexionar las rodillas. Mantener la carga cerca del cuerpo. No torcerse. Instrucciones generales Mantenga un peso saludable. El exceso de peso ejerce tensin adicional sobre la espalda. Use  calzado con buen apoyo y cmodo. Evite usar tacones. Evite dormir sobre un colchn que sea demasiado blando o demasiado duro. Un colchn que ofrezca un apoyo suficientemente firme para su espalda al dormir puede ayudar a Best boy. Concurra a todas las visitas de seguimiento como se lo haya indicado el mdico. Esto es importante. Comunquese con un mdico si: Tiene un dolor con estas caractersticas: Lo despierta cuando est dormido. Empeora al estar recostado. Es Lexicographer del que experiment en el pasado. Dura ms de 4 semanas. Pierde peso de [place] inexplicable. Solicite ayuda inmediatamente si: No puede controlar cundo Production assistant, radio (incontinencia). Tiene lo siguiente: Debilidad que [employer] parte inferior de la espalda, la pelvis, las nalgas o las piernas. Enrojecimiento o inflamacin en la espalda. Sensacin de ardor al [employer]. Resumen La citica es Conservation officer, historic buildings, entumecimiento, debilidad u hormigueo a lo largo del nervio citico. Esta afeccin es causada por una presin sobre el nervio citico o "pellizco" del nervio citico. La citica puede Engineer, drilling, adormecimiento u hormigueo en la parte inferior de la espalda, las piernas, las caderas y las nalgas. El tratamiento a menudo incluye reposo, ejercicios, medicamentos y Regulatory affairs officer. Esta informacin no tiene Marine scientist el consejo del mdico. Asegrese de hacerle al mdico cualquier pregunta que tenga. Document Revised: 06/20/2018 Document Reviewed: 06/20/2018 Elsevier Patient Education  2022 [employer].

## 2021-02-16 NOTE — Assessment & Plan Note (Addendum)
Anticipate L sciatica. Discussed with patient. rec gentle stretching, start exercises provided from [facility] pt advisor, and will Rx short prednisone taper - reviewed steroid precautions. In advanced age, check lumbar films today. Possible UTI contributing - see above.  Update if not improving with treatment.

## 2021-02-16 NOTE — Assessment & Plan Note (Addendum)
Notes ongoing dysuria along with dribbling incontinence after voiding - both of which are more chronic symptoms, however with new lower back pain check urinalysis.  UA today with 3+ LE, micro also suspicious.  Will send UCx and treat according to results.

## 2021-02-16 NOTE — Progress Notes (Signed)
Patient ID: Joseph Osborn, male    DOB: 1925-08-20, 87 y.o.   MRN: [id number]  This visit was conducted in person.  BP 122/74   Pulse 62   Temp 97.7 F (36.5 C) (Temporal)   Ht 5' 4.5" (1.638 m)   Wt 177 lb 1 oz (80.3 kg)   SpO2 96%   BMI 29.92 kg/m    CC: lower back pain  Subjective:   HPI: Joseph Osborn is a 87 y.o. male presenting on 02/16/2021 for Back Pain (C/o left low back pain, started on right side.  Started about 2 wks ago.  Pt accompanied by daughter, [name]- temp 97.8.)   2 wk h/o left lower back pain, initially on right side.  Notes shooting pain down posterior left buttock to knee.  Denies inciting trauma/injury or falls.  He spends significant amount of time seated watching TV and thinks this contributes to back pain.  He's been using tylenol to help sleep.   Also notes early morning dysuria and intermittent dribbling after voiding without blood in urine or abdominal pain, nausea/vomiting.   Denies fevers/chills, new bowel/bladder incontinence, numbness/weakness of legs or saddle anesthesia.   Notes increasing unsteadiness. No vertigo or neuropathy symptoms.  He had a [place]. Also this week had a fall off a chair (forgot it had no back rest).      Relevant past medical, surgical, family and social history reviewed and updated as indicated. Interim medical history since our last visit reviewed. Allergies and medications reviewed and updated. Outpatient Medications Prior to Visit  Medication Sig Dispense Refill   Acetaminophen (TYLENOL PO) Take by mouth at bedtime. As needed     atenolol (TENORMIN) 50 MG tablet Take 1 tablet (50 mg total) by mouth daily. 90 tablet 3   linaclotide (LINZESS) 72 MCG capsule Take 1 capsule (72 mcg total) by mouth every other day. 45 capsule 1   tamsulosin (FLOMAX) 0.4 MG CAPS capsule Take 1 capsule (0.4 mg total) by mouth daily. 90 capsule 3   fexofenadine (ALLEGRA ALLERGY) 180 MG tablet Take 1 tablet (180  mg total) by mouth daily. (Patient not taking: Reported on 02/16/2021)     levothyroxine (SYNTHROID) 50 MCG tablet Take 1 tablet (50 mcg total) by mouth at bedtime. (Patient not taking: Reported on 02/16/2021) 30 tablet 6   Cholecalciferol (VITAMIN D3) 25 MCG (1000 UT) CAPS Take 1 capsule (1,000 Units total) by mouth daily. (Patient not taking: Reported on 11/27/2020) 30 capsule    No facility-administered medications prior to visit.     Per HPI unless specifically indicated in ROS section below Review of Systems  Objective:  BP 122/74   Pulse 62   Temp 97.7 F (36.5 C) (Temporal)   Ht 5' 4.5" (1.638 m)   Wt 177 lb 1 oz (80.3 kg)   SpO2 96%   BMI 29.92 kg/m   Wt Readings from Last 3 Encounters:  02/16/21 177 lb 1 oz (80.3 kg)  11/27/20 176 lb 6 oz (80 kg)  07/22/20 181 lb 1 oz (82.1 kg)      Physical Exam Vitals and nursing note reviewed.  Constitutional:      Appearance: Normal appearance. He is not ill-appearing.  Abdominal:     General: Bowel sounds are normal. There is no distension.     Palpations: Abdomen is soft. There is no mass.     Tenderness: There is no abdominal tenderness. There is no guarding or rebound.  Hernia: No hernia is present.  Musculoskeletal:     Right lower leg: No edema.     Left lower leg: No edema.     Comments:  No significant pain midline spine + paraspinous mm tenderness + SLR on left. No pain with int/ext rotation at hip. Neg FABER. No pain at SIJ, GTB or sciatic notch bilaterally.   Skin:    General: Skin is warm and dry.     Findings: No rash.  Neurological:     Mental Status: He is alert.     Deep Tendon Reflexes:     Reflex Scores:      Patellar reflexes are 2+ on the right side and 2+ on the left side.    Comments: 5/5 strength BLE      Results for orders placed or performed in visit on 02/16/21  POCT Urinalysis Dipstick (Automated)  Result Value Ref Range   Color, UA yellow    Clarity, UA clear    Glucose, UA  Negative Negative   Bilirubin, UA negative    Ketones, UA negative    Spec Grav, UA 1.015 1.010 - 1.025   Blood, UA negative    pH, UA 6.0 5.0 - 8.0   Protein, UA Negative Negative   Urobilinogen, UA 0.2 0.2 or 1.0 E.U./dL   Nitrite, UA negative    Leukocytes, UA Large (3+) (A) Negative    Assessment & Plan:  This visit occurred during the SARS-CoV-2 public health emergency.  Safety protocols were in place, including screening questions prior to the visit, additional usage of staff PPE, and extensive cleaning of exam room while observing appropriate contact time as indicated for disinfecting solutions.   Problem List Items Addressed This Visit     Dysuria    Notes ongoing dysuria along with dribbling incontinence after voiding - both of which are more chronic symptoms, however with new lower back pain check urinalysis.  UA today with 3+ LE, micro also suspicious.  Will send UCx and treat according to results.       Relevant Orders   Urine Culture   Acute bilateral low back pain with left-sided sciatica - Primary    Anticipate L sciatica. Discussed with patient. rec gentle stretching, start exercises provided from [facility] pt advisor, and will Rx short prednisone taper - reviewed steroid precautions. In advanced age, check lumbar films today. Possible UTI contributing - see above.  Update if not improving with treatment.       Relevant Medications   Acetaminophen (TYLENOL PO)   predniSONE (DELTASONE) 20 MG tablet   Other Relevant Orders   POCT Urinalysis Dipstick (Automated) (Completed)   DG Lumbar Spine Complete   Other Visit Diagnoses     Need for influenza vaccination       Relevant Orders   Flu Vaccine QUAD High Dose(Fluad) (Completed)        Meds ordered this encounter  Medications   predniSONE (DELTASONE) 20 MG tablet    Sig: Take two tablets daily for 3 days followed by one tablet daily for 3 days    Dispense:  9 tablet    Refill:  0   Orders Placed This Encounter   Procedures   Urine Culture   DG Lumbar Spine Complete    Standing Status:   Future    Number of Occurrences:   1    Standing Expiration Date:   02/16/2022    Order Specific Question:   Reason for Exam (SYMPTOM  OR DIAGNOSIS  REQUIRED)    Answer:   low back pain with L sciatica    Order Specific Question:   Preferred imaging location?    Answer:   [facility]   Flu Vaccine QUAD High Dose(Fluad)   POCT Urinalysis Dipstick (Automated)     Patient instructions: Rayos x de espalda Cro[place] que tiene inflamacion del nervio ciatico.  Tome prednisona 2 pastillas por 3 dias y despues 1 pastilla por 3 dias.  Haga ejercicios que le dare.  Si no esta mejorando dejeme saber para remision a terapia fisica.  Tambien he [place] para cultura - lo llamaremos con [employer].   Follow up plan: Return if symptoms worsen or fail to improve.  Ria Bush, MD

## 2021-02-17 LAB — URINE CULTURE
MICRO NUMBER:: 12487980
Result:: NO GROWTH
SPECIMEN QUALITY:: ADEQUATE

## 2021-03-23 ENCOUNTER — Encounter: Payer: Self-pay | Admitting: Dermatology

## 2021-03-23 ENCOUNTER — Telehealth: Payer: Self-pay

## 2021-03-23 ENCOUNTER — Other Ambulatory Visit: Payer: Self-pay

## 2021-03-23 ENCOUNTER — Ambulatory Visit (INDEPENDENT_AMBULATORY_CARE_PROVIDER_SITE_OTHER): Payer: Medicare Other | Admitting: Dermatology

## 2021-03-23 DIAGNOSIS — C44519 Basal cell carcinoma of skin of other part of trunk: Secondary | ICD-10-CM

## 2021-03-23 MED ORDER — MUPIROCIN 2 % EX OINT: 1.0000 "application " | TOPICAL_OINTMENT | Freq: Every day | CUTANEOUS | 0 refills | Status: DC

## 2021-03-23 NOTE — Patient Instructions (Signed)
Wound Care Instructions  Cleanse wound gently with soap and water once a day then pat dry with clean gauze. Apply a thing coat of Petrolatum (petroleum jelly, "Vaseline") over the wound (unless you have an allergy to this). We recommend that you use a new, sterile tube of Vaseline. Do not pick or remove scabs. Do not remove the yellow or white "healing tissue" from the base of the wound.  Cover the wound with fresh, clean, nonstick gauze and secure with paper tape. You may use Band-Aids in place of gauze and tape if the would is small enough, but would recommend trimming much of the tape off as there is often too much. Sometimes Band-Aids can irritate the skin.  You should call the office for your biopsy report after 1 week if you have not already been contacted.  If you experience any problems, such as abnormal amounts of bleeding, swelling, significant bruising, significant pain, or evidence of infection, please call the office immediately.  FOR ADULT SURGERY PATIENTS: If you need something for pain relief you may take 1 extra strength Tylenol (acetaminophen) AND 2 Ibuprofen (200mg each) together every 4 hours as needed for pain. (do not take these if you are allergic to them or if you have a reason you should not take them.) Typically, you may only need pain medication for 1 to 3 days.   If you have any questions or concerns for your doctor, please call our main line at [phone] and press option 4 to reach your doctor's medical assistant. If no one answers, please leave a voicemail as directed and we will return your call as soon as possible. Messages left after 4 pm will be answered the following business day.   You may also send us a message via MyChart. We typically respond to MyChart messages within 1-2 business days.  For prescription refills, please ask your pharmacy to contact our office. Our fax number is [phone].  If you have an urgent issue when the clinic is closed that  cannot wait until the next business day, you can page your doctor at the number below.    Please note that while we do our best to be available for urgent issues outside of office hours, we are not available 24/7.   If you have an urgent issue and are unable to reach us, you may choose to seek medical care at your doctor's office, retail clinic, urgent care center, or emergency room.  If you have a medical emergency, please immediately call 911 or go to the emergency department.  Pager Numbers  - Dr. Kowalski: [phone]  - Dr. Moye: [phone]  - Dr. Stewart: [phone]  In the event of inclement weather, please call our main line at [phone] for an update on the status of any delays or closures.  Dermatology Medication Tips: Please keep the boxes that topical medications come in in order to help keep track of the instructions about where and how to use these. Pharmacies typically print the medication instructions only on the boxes and not directly on the medication tubes.   If your medication is too expensive, please contact our office at [phone] option 4 or send us a message through MyChart.   We are unable to tell what your co-pay for medications will be in advance as this is different depending on your insurance coverage. However, we may be able to find a substitute medication at lower cost or fill out paperwork to get insurance to cover a needed   medication.   If a prior authorization is required to get your medication covered by your insurance company, please allow us 1-2 business days to complete this process.  Drug prices often vary depending on where the prescription is filled and some pharmacies may offer cheaper prices.  The website www.goodrx.com contains coupons for medications through different pharmacies. The prices here do not account for what the cost may be with help from insurance (it may be cheaper with your insurance), but the website can give you the  price if you did not use any insurance.  - You can print the associated coupon and take it with your prescription to the pharmacy.  - You may also stop by our office during regular business hours and pick up a GoodRx coupon card.  - If you need your prescription sent electronically to a different pharmacy, notify our office through [facility] MyChart or by phone at [phone] option 4.   

## 2021-03-23 NOTE — Progress Notes (Signed)
   Follow-Up Visit   Subjective  Joseph Osborn is a 87 y.o. male who presents for the following: BCC bx proven (L groin, pt presents for excision).  Patient accompanied by son who contributes to history.  The following portions of the chart were reviewed this encounter and updated as appropriate:   Tobacco  Allergies  Meds  Problems  Med Hx  Surg Hx  Fam Hx     Review of Systems:  No other skin or systemic complaints except as noted in HPI or Assessment and Plan.  Objective  Well appearing patient in no apparent distress; mood and affect are within normal limits.  A focused examination was performed including left groin. Relevant physical exam findings are noted in the Assessment and Plan.  L groin Pink bx site 3.1 x 1.5cm   Assessment & Plan  Basal cell carcinoma (BCC) of skin of other part of torso L groin  Skin excision  Lesion length (cm):  3.1 Lesion width (cm):  1.5 Margin per side (cm):  0.2 Total excision diameter (cm):  3.5 Informed consent: discussed and consent obtained   Timeout: patient name, date of birth, surgical site, and procedure verified   Procedure prep:  Patient was prepped and draped in usual sterile fashion Prep type:  Isopropyl alcohol and povidone-iodine Anesthesia: the lesion was anesthetized in a standard fashion   Anesthetic:  1% lidocaine w/ epinephrine 1-100,000 buffered w/ 8.4% NaHCO3 (6cc lido w/ epi, 3cc bupivicaine, Total 9cc) Instrument used comment:  #15c blade Hemostasis achieved with: pressure   Hemostasis achieved with comment:  Electrocautery Outcome: patient tolerated procedure well with no complications   Post-procedure details: sterile dressing applied and wound care instructions given   Dressing type: bandage and pressure dressing (Mupirocin)    Skin repair Complexity:  Complex Final length (cm):  4 Reason for type of repair: reduce tension to allow closure, reduce the risk of dehiscence, infection, and necrosis, reduce  subcutaneous dead space and avoid a hematoma, allow closure of the large defect, preserve normal anatomy, preserve normal anatomical and functional relationships and enhance both functionality and cosmetic results   Undermining: area extensively undermined   Undermining comment:  Undermining Defect 3.5cm Subcutaneous layers (deep stitches):  Suture size:  3-0 Suture type: Vicryl (polyglactin 910)   Subcutaneous suture technique: Inverted Dermal. Fine/surface layer approximation (top stitches):  Suture size:  3-0 Suture type: nylon   Stitches: horizontal mattress   Suture removal (days):  7 Hemostasis achieved with: pressure Outcome: patient tolerated procedure well with no complications   Post-procedure details: sterile dressing applied and wound care instructions given   Dressing type: bandage, pressure dressing and bacitracin (Mupirocin)    mupirocin ointment (BACTROBAN) 2 % Apply 1 application topically daily. Qd to excision site  Specimen 1 - Surgical pathology Differential Diagnosis: Bx proven BCC  Check Margins: yes Pink bx site 3.1 x 1.5cm [id number]  Bx proven  Start Mupirocin oint qd to excision site  Return in about 1 week (around 03/30/2021) for suture removal.  I, Othelia Pulling, RMA, am acting as scribe for Sarina Ser, MD . Documentation: I have reviewed the above documentation for accuracy and completeness, and I agree with the above.  Sarina Ser, MD

## 2021-03-23 NOTE — Telephone Encounter (Signed)
Spoke to pt's daughter and pt is doing fine after today's surgery.Joseph Osborn

## 2021-03-28 ENCOUNTER — Encounter: Payer: Self-pay | Admitting: Dermatology

## 2021-03-30 ENCOUNTER — Other Ambulatory Visit: Payer: Self-pay

## 2021-03-30 ENCOUNTER — Ambulatory Visit (INDEPENDENT_AMBULATORY_CARE_PROVIDER_SITE_OTHER): Payer: Medicare Other | Admitting: Dermatology

## 2021-03-30 DIAGNOSIS — Z85828 Personal history of other malignant neoplasm of skin: Secondary | ICD-10-CM

## 2021-03-30 MED ORDER — SULFAMETHOXAZOLE-TRIMETHOPRIM 800-160 MG PO TABS: 1.0000 | ORAL_TABLET | Freq: Two times a day (BID) | ORAL | 0 refills | Status: AC

## 2021-03-30 NOTE — Progress Notes (Signed)
   Follow-Up Visit   Subjective  Joseph Osborn is a 87 y.o. male who presents for the following: Other (Post op - BCC of left groin - Margins free).  Accompanied by son  The following portions of the chart were reviewed this encounter and updated as appropriate:       Review of Systems:  No other skin or systemic complaints except as noted in HPI or Assessment and Plan.  Objective  Well appearing patient in no apparent distress; mood and affect are within normal limits.  A focused examination was performed including left groin. Relevant physical exam findings are noted in the Assessment and Plan.  Left groin Healing excision site   Assessment & Plan  History of basal cell carcinoma (BCC) Left groin  Encounter for Removal of Sutures - Incision site at the left groin is clean, dry and intact, with some edema, mild erythema - Wound cleansed,1 cc lidocaine with epi, sutures partially buried from healing process, all removed under local anesthesia, wound cleansed and mupirocin applied.  - Discussed pathology results showing BCC margins free.  - Start Bactrim DS 1 po qd x 7 days and continue Mupirocin oint daily until completely healed. - Scars remodel for a full year. - Patient advised to call with any concerns or if they notice any new or changing lesions.   sulfamethoxazole-trimethoprim (BACTRIM DS) 800-160 MG tablet - Left groin Take 1 tablet by mouth 2 (two) times daily for 7 days.  Return for Follow up as scheduled with Dr Nehemiah Massed.  I, Ashok Cordia, CMA, am acting as scribe for Brendolyn Patty, MD .  Documentation: I have reviewed the above documentation for accuracy and completeness, and I agree with the above.  Brendolyn Patty MD

## 2021-04-13 ENCOUNTER — Telehealth (INDEPENDENT_AMBULATORY_CARE_PROVIDER_SITE_OTHER): Payer: Medicare Other | Admitting: Family Medicine

## 2021-04-13 ENCOUNTER — Encounter: Payer: Self-pay | Admitting: Family Medicine

## 2021-04-13 ENCOUNTER — Telehealth: Payer: Self-pay | Admitting: Family Medicine

## 2021-04-13 ENCOUNTER — Other Ambulatory Visit: Payer: Self-pay

## 2021-04-13 DIAGNOSIS — U071 COVID-19: Secondary | ICD-10-CM

## 2021-04-13 HISTORY — DX: COVID-19: U07.1

## 2021-04-13 MED ORDER — PREDNISONE 10 MG PO TABS: ORAL_TABLET | ORAL | 0 refills | Status: DC

## 2021-04-13 MED ORDER — MOLNUPIRAVIR EUA 200MG CAPSULE: 4.0000 | ORAL_CAPSULE | Freq: Two times a day (BID) | ORAL | 0 refills | Status: AC

## 2021-04-13 NOTE — Assessment & Plan Note (Addendum)
87 yo male pt with h/o copd  Symptoms are mild to moderate, no fever or sob Some mild wheezing on/off-prednisone taper 40 mg px Discussed side effects  Also molnupiravir antiviral tx (due to age and risk status) Discussed how to take this and poss side eff Will continue tylenol, fluids and rest , also family to help with ambulation  ER precautions discussed in detail Update if not starting to improve in a week or if worsening    Meds ordered this encounter  Medications  . molnupiravir EUA (LAGEVRIO) 200 mg CAPS capsule    Sig: Take 4 capsules (800 mg total) by mouth 2 (two) times daily for 5 days.    Dispense:  40 capsule    Refill:  0  . predniSONE (DELTASONE) 10 MG tablet    Sig: Take 4 pills once daily by mouth for 3 days, then 3 pills daily for 3 days, then 2 pills daily for 3 days then 1 pill daily for 3 days then stop    Dispense:  30 tablet    Refill:  0

## 2021-04-13 NOTE — Patient Instructions (Signed)
Encourage fluids and rest Do help with walking/use walker if weak and watch closely  Take the prednisone as directed for wheezing  The molnupiravir for covid (antiviral) and let us know if any side effects   If symptoms become severe or any shortness of breath please alert [place] and go to the ER

## 2021-04-13 NOTE — Telephone Encounter (Signed)
Lvm asking pt's daughter, [name] (on dpr) to call back.  Need clarification of message and/or pt's sxs.

## 2021-04-13 NOTE — Telephone Encounter (Signed)
His daughter Joseph Osborn called and stated that her husband called her and stated he is having a hard time getting up off the couch and moving around and wanted if its because he is sick or if he is needing PT.

## 2021-04-13 NOTE — Progress Notes (Signed)
Virtual Visit via Video Note  I connected with Joseph Osborn on 04/13/21 at 11:00 AM EST by a video enabled telemedicine application and verified that I am speaking with the correct person using two identifiers.  Location: Patient: home Provider: office    I discussed the limitations of evaluation and management by telemedicine and the availability of in person appointments. The patient expressed understanding and agreed to proceed.  Parties involved in encounter  Patient: Joseph Osborn Daughter: Joseph Osborn   Provider:  Loura Pardon MD   Video failed today so visit was done by phone   History of Present Illness: 87 yo pt of Dr Darnell Level presents with covid 80  He has a h/o copd per his chart  Whole house tested positive for covid   His symptoms started Sunday night  Then tested positive for covid today  No fever  Feels foggy  No headache  A little cough - phlegm (but this is normal for him)  Just a little wheezing /not severe  (does not like inhalers)  A little congestion  No n/v/d  Feels tired also A little more unsteady so using a walker today     He has had covid imms   Lab Results  Component Value Date   CREATININE 1.10 07/15/2020   BUN 15 07/15/2020   NA 141 07/15/2020   K 5.1 07/15/2020   CL 106 07/15/2020   CO2 29 07/15/2020   Patient Active Problem List   Diagnosis Date Noted   Acute bilateral low back pain with left-sided sciatica 02/16/2021   Bilateral hearing loss 11/27/2020   Skin macule 11/27/2020   Vitamin D deficiency 07/29/2020   Dysuria 07/28/2020   Chronic nasal congestion 05/19/2020   Gassiness 05/19/2020   Chronic cough 03/19/2020   Black stool 03/19/2020   Chronic constipation 03/19/2020   Benign prostatic hyperplasia 01/20/2016   CKD (chronic kidney disease), stage III ([place]) 01/20/2016   COPD (chronic obstructive pulmonary disease) ([place]) 01/20/2016   DDD (degenerative disc disease), cervical 01/20/2016   Borderline hypothyroidism 01/20/2016    Osteoarthritis 01/20/2016   CAD (coronary artery disease) 10/14/2013   Past Medical History:  Diagnosis Date   Arthritis    Basal cell carcinoma 02/04/2021   L groin, excised 03/23/21   BPH (benign prostatic hyperplasia)    CAD (coronary artery disease)    Chronic kidney disease    Stage III   COPD (chronic obstructive pulmonary disease) ([place])    DDD (degenerative disc disease), cervical    Hypertension    Lipoma 2017   Lower back pain    Myocardial infarction ([place])    approx 2000   Thyroid disease    Hypothyroidism   Wears dentures    partial upper   Past Surgical History:  Procedure Laterality Date   BLADDER SURGERY  03/2008   bladder polyp removed Yves Dill)   CARDIAC CATHETERIZATION  2000   1 stent placed after MI   CATARACT EXTRACTION W/ INTRAOCULAR LENS  IMPLANT, BILATERAL  2014   [place], Dr. George Ina   CHOLECYSTECTOMY     In [place], [place]  03/13/2014   [place], Dr. Vira Agar   COLONOSCOPY WITH PROPOFOL N/A 03/03/2016   Procedure: COLONOSCOPY WITH PROPOFOL;  Surgeon: Lucilla Lame, MD;  Location: [facility];  Service: Endoscopy;  Laterality: N/A;   ESOPHAGOGASTRODUODENOSCOPY (EGD) WITH PROPOFOL N/A 03/03/2016   Procedure: ESOPHAGOGASTRODUODENOSCOPY (EGD) WITH PROPOFOL;  Surgeon: Lucilla Lame, MD;  Location: [facility];  Service: Endoscopy;  Laterality: N/A;  Interpreter needed   HEMORRHOID SURGERY     [place], [place]   TRANSURETHRAL RESECTION OF PROSTATE  03/24/2008   Dr Eliberto Ivory, [facility]   Social History   Tobacco Use   Smoking status: Never   Smokeless tobacco: Never  Substance Use Topics   Alcohol use: Not Currently    Comment: very rare(Holidays)   Drug use: No   Family History  Problem Relation Age of Onset   Heart disease Mother    Heart attack Mother    Liver cancer Cousin    Diabetes Neg Hx    Allergies  Allergen Reactions   Shellfish Allergy Anaphylaxis   Oxybutynin Other (See Comments)    Doesn't remember  reaction   Current Outpatient Medications on File Prior to Visit  Medication Sig Dispense Refill   Acetaminophen (TYLENOL PO) Take by mouth at bedtime. As needed     atenolol (TENORMIN) 50 MG tablet Take 1 tablet (50 mg total) by mouth daily. 90 tablet 3   linaclotide (LINZESS) 72 MCG capsule Take 1 capsule (72 mcg total) by mouth every other day. 45 capsule 1   tamsulosin (FLOMAX) 0.4 MG CAPS capsule Take 1 capsule (0.4 mg total) by mouth daily. 90 capsule 3   No current facility-administered medications on file prior to visit.   Review of Systems  Constitutional:  Positive for malaise/fatigue. Negative for chills and fever.  HENT:  Positive for congestion. Negative for ear pain, sinus pain and sore throat.   Eyes:  Negative for blurred vision, discharge and redness.  Respiratory:  Positive for cough, sputum production and wheezing. Negative for shortness of breath and stridor.   Cardiovascular:  Negative for chest pain, palpitations and leg swelling.  Gastrointestinal:  Negative for abdominal pain, diarrhea, nausea and vomiting.  Musculoskeletal:  Negative for myalgias.  Skin:  Negative for rash.  Neurological:  Negative for dizziness and headaches.    Observations/Objective: Pt sounds well, not in distress No sob or wheezing audible Occ clears throat  Does not cough during visit Not hoarse History given (interpreted by) daughter today  She indicates he looks weaker than usual  Mood is nl   Assessment and Plan: Problem List Items Addressed This Visit       Other   COVID-19    87 yo male pt with h/o copd  Symptoms are mild to moderate, no fever or sob Some mild wheezing on/off-prednisone taper 40 mg px Discussed side effects  Also molnupiravir antiviral tx (due to age and risk status) Discussed how to take this and poss side eff Will continue tylenol, fluids and rest , also family to help with ambulation  ER precautions discussed in detail Update if not starting to  improve in a week or if worsening    Meds ordered this encounter  Medications   molnupiravir EUA (LAGEVRIO) 200 mg CAPS capsule    Sig: Take 4 capsules (800 mg total) by mouth 2 (two) times daily for 5 days.    Dispense:  40 capsule    Refill:  0   predniSONE (DELTASONE) 10 MG tablet    Sig: Take 4 pills once daily by mouth for 3 days, then 3 pills daily for 3 days, then 2 pills daily for 3 days then 1 pill daily for 3 days then stop    Dispense:  30 tablet    Refill:  0         Relevant Medications   molnupiravir EUA (LAGEVRIO) 200 mg  CAPS capsule     Follow Up Instructions: Encourage fluids and rest Do help with walking/use walker if weak and watch closely  Take the prednisone as directed for wheezing  The molnupiravir for covid (antiviral) and let us know if any side effects   If symptoms become severe or any shortness of breath please alert [place] and go to the ER    I discussed the assessment and treatment plan with the patient. The patient was provided an opportunity to ask questions and all were answered. The patient agreed with the plan and demonstrated an understanding of the instructions.   The patient was advised to call back or seek an in-person evaluation if the symptoms worsen or if the condition fails to improve as anticipated.  I provided 17  minutes of non-face-to-face time during this encounter.   Loura Pardon, MD

## 2021-04-14 ENCOUNTER — Telehealth: Payer: Self-pay | Admitting: *Deleted

## 2021-04-14 ENCOUNTER — Telehealth: Payer: Medicare Other | Admitting: Family Medicine

## 2021-04-14 MED ORDER — PREDNISONE 10 MG PO TABS: ORAL_TABLET | ORAL | 0 refills | Status: DC

## 2021-04-14 NOTE — Telephone Encounter (Signed)
Spoke with pt's daughter, [name] (on dpr), relaying Dr. Synthia Innocent message.  She verbalizes understanding and states pt is doing better today.

## 2021-04-14 NOTE — Telephone Encounter (Signed)
Reasonable to do physical therapy eval via home health however would want him better from [place] standpoint prior to ordering as COVID itself could also contribute to weakness, fatigue, deconditioning.  Would first want to see how he's doing after he recovers from [place], if ongoing difficulty as recently noted, let me know and I will place [facility] referral.

## 2021-04-14 NOTE — Telephone Encounter (Signed)
Spoke with daughter [name] and she did say pharmacy told her the same thing he shouldn't break the molnupiravir caps open. Daughter said it would be hard for them to get pt out right now due to him being sick and mobility issues. So labs for paxlovid would be to much and IV infusion would be to much. Daughter said pt seems to be improving by just taking the prednisone so they will just take that and keep [place] posted. Daughter advise if sxs worsen or don't continue to improve to let us know

## 2021-04-14 NOTE — Telephone Encounter (Signed)
I did send the 4 pills in to pharmacy-thanks for the heads up and glad he is doing a little better today   It looks like you are not supposed to break open the capsules  Go ahead and try them but if he is unable or vomits let us know   There is another antiviral called paxlovid - it is tablet form and unsure if this is any bigger or smaller  For that medicine we need to first get labs   There is also an IV infusion optoin   Please let me know  Will cc to Dr Darnell Level as well

## 2021-04-14 NOTE — Telephone Encounter (Signed)
Thanks for letting me know  Continue to monitor  Please check on tomorrow afternoon or fri am  Will cc to pcp

## 2021-04-14 NOTE — Telephone Encounter (Signed)
-----   Message from Abner Greenspan, MD sent at 04/13/2021  1:38 PM EST ----- Joseph Osborn, please check on him tomorrow re: covid symptoms

## 2021-04-14 NOTE — Telephone Encounter (Signed)
Called pt and spoke with son-n-law [name] wasn't available) pt gave verbal permission to speak with him. He states pt is doing better today but yesterday was rough. They have him the 4 prednisone at 4pm yesterday and he immediately vomited them back up. They waited until 8pm and gave him 2 tabs of prednisone and then 2 more at 10 pm which pt kept down with no issues. Pt is still having mobility issues but is feeling better today an there is a call into PCP regarding help with mobility issues. Pt did take prednisone this morning and kept all pills down. Family's only concern is that the molnupiravir is a very large capsule and since pt was having a hard time keeping down the prednisone yesterday they didn't give it to him. They asked if it could be opened and mixed with drink since it's a capsule, I did advise them to check with the pharmacy regarding opening the caps and they will be able to let them know if it's appropriate to do so.  Family's other concern is pt is now 4 pills short of the prednisone taper and didn't know if that would be okay or if Dr. Glori Bickers needs to send in 4 additional pills.   [place]

## 2021-04-14 NOTE — Telephone Encounter (Signed)
See separate phone note. Dr. Glori Bickers did a virtual visit due to pt having covid yesterday. Called to check on pt today and they asked about this phone note. Spoke with pt's son-in-law (verbal permission given from pt) due to daughter [name] not available. He stated that he has been taking care of pt recently due to covid and he realized pt isn't getting up correctly from sitting positions like chair, bed, and commode. They are worried that he may fall given his weakness and the way he is getting up. Also the way he is using his walker looks incorrect also. The family is wondering if PT can come and eval and do some type of teaching on how to properly use walker, go from standing to sitting and back up again correctly and also eval weakness to see if it's just from age or due to recent covid.  I told them I would relay their concern's to PCP and him/assistant will f/u with them

## 2021-04-16 NOTE — Telephone Encounter (Signed)
Spoke with Joseph Osborn, patient is doing much better and will keep [place] updated as needed

## 2021-04-21 ENCOUNTER — Emergency Department: Payer: Medicare Other

## 2021-04-21 ENCOUNTER — Other Ambulatory Visit: Payer: Self-pay

## 2021-04-21 ENCOUNTER — Inpatient Hospital Stay
Admission: EM | Admit: 2021-04-21 | Discharge: 2021-04-23 | DRG: 193 | Disposition: A | Discharge status: Home / Self Care | Payer: Medicare Other | Attending: Internal Medicine | Admitting: Internal Medicine

## 2021-04-21 DIAGNOSIS — R531 Weakness: Secondary | ICD-10-CM | POA: Diagnosis not present

## 2021-04-21 DIAGNOSIS — Z7952 Long term (current) use of systemic steroids: Secondary | ICD-10-CM

## 2021-04-21 DIAGNOSIS — N400 Enlarged prostate without lower urinary tract symptoms: Secondary | ICD-10-CM | POA: Diagnosis present

## 2021-04-21 DIAGNOSIS — M1990 Unspecified osteoarthritis, unspecified site: Secondary | ICD-10-CM | POA: Diagnosis present

## 2021-04-21 DIAGNOSIS — J440 Chronic obstructive pulmonary disease with acute lower respiratory infection: Secondary | ICD-10-CM | POA: Diagnosis present

## 2021-04-21 DIAGNOSIS — I129 Hypertensive chronic kidney disease with stage 1 through stage 4 chronic kidney disease, or unspecified chronic kidney disease: Secondary | ICD-10-CM | POA: Diagnosis present

## 2021-04-21 DIAGNOSIS — N182 Chronic kidney disease, stage 2 (mild): Secondary | ICD-10-CM | POA: Diagnosis present

## 2021-04-21 DIAGNOSIS — J189 Pneumonia, unspecified organism: Secondary | ICD-10-CM | POA: Diagnosis present

## 2021-04-21 DIAGNOSIS — N401 Enlarged prostate with lower urinary tract symptoms: Secondary | ICD-10-CM | POA: Diagnosis not present

## 2021-04-21 DIAGNOSIS — N1831 Chronic kidney disease, stage 3a: Secondary | ICD-10-CM | POA: Diagnosis not present

## 2021-04-21 DIAGNOSIS — Z85828 Personal history of other malignant neoplasm of skin: Secondary | ICD-10-CM

## 2021-04-21 DIAGNOSIS — Z8249 Family history of ischemic heart disease and other diseases of the circulatory system: Secondary | ICD-10-CM

## 2021-04-21 DIAGNOSIS — Z8701 Personal history of pneumonia (recurrent): Secondary | ICD-10-CM | POA: Diagnosis not present

## 2021-04-21 DIAGNOSIS — Z888 Allergy status to other drugs, medicaments and biological substances status: Secondary | ICD-10-CM | POA: Diagnosis not present

## 2021-04-21 DIAGNOSIS — J9601 Acute respiratory failure with hypoxia: Secondary | ICD-10-CM | POA: Diagnosis present

## 2021-04-21 DIAGNOSIS — Z8616 Personal history of COVID-19: Secondary | ICD-10-CM

## 2021-04-21 DIAGNOSIS — Z9841 Cataract extraction status, right eye: Secondary | ICD-10-CM

## 2021-04-21 DIAGNOSIS — E039 Hypothyroidism, unspecified: Secondary | ICD-10-CM | POA: Diagnosis present

## 2021-04-21 DIAGNOSIS — I2510 Atherosclerotic heart disease of native coronary artery without angina pectoris: Secondary | ICD-10-CM | POA: Diagnosis present

## 2021-04-21 DIAGNOSIS — R3912 Poor urinary stream: Secondary | ICD-10-CM | POA: Diagnosis not present

## 2021-04-21 DIAGNOSIS — M5030 Other cervical disc degeneration, unspecified cervical region: Secondary | ICD-10-CM | POA: Diagnosis present

## 2021-04-21 DIAGNOSIS — I252 Old myocardial infarction: Secondary | ICD-10-CM

## 2021-04-21 DIAGNOSIS — Z9842 Cataract extraction status, left eye: Secondary | ICD-10-CM

## 2021-04-21 DIAGNOSIS — J449 Chronic obstructive pulmonary disease, unspecified: Secondary | ICD-10-CM | POA: Diagnosis present

## 2021-04-21 DIAGNOSIS — H9193 Unspecified hearing loss, bilateral: Secondary | ICD-10-CM | POA: Diagnosis present

## 2021-04-21 DIAGNOSIS — Z91013 Allergy to seafood: Secondary | ICD-10-CM | POA: Diagnosis not present

## 2021-04-21 DIAGNOSIS — Z79899 Other long term (current) drug therapy: Secondary | ICD-10-CM

## 2021-04-21 DIAGNOSIS — N183 Chronic kidney disease, stage 3 unspecified: Secondary | ICD-10-CM | POA: Diagnosis present

## 2021-04-21 DIAGNOSIS — Z961 Presence of intraocular lens: Secondary | ICD-10-CM | POA: Diagnosis present

## 2021-04-21 HISTORY — DX: Pneumonia, unspecified organism: J18.9

## 2021-04-21 LAB — HEPATIC FUNCTION PANEL
ALT: 36 U/L (ref 0–44)
AST: 31 U/L (ref 15–41)
Albumin: 3.9 g/dL (ref 3.5–5.0)
Alkaline Phosphatase: 65 U/L (ref 38–126)
Bilirubin, Direct: 0.2 mg/dL (ref 0.0–0.2)
Indirect Bilirubin: 1.2 mg/dL — ABNORMAL HIGH (ref 0.3–0.9)
Total Bilirubin: 1.4 mg/dL — ABNORMAL HIGH (ref 0.3–1.2)
Total Protein: 7.1 g/dL (ref 6.5–8.1)

## 2021-04-21 LAB — CBC
HCT: 46 % (ref 39.0–52.0)
Hemoglobin: 15.5 g/dL (ref 13.0–17.0)
MCH: 30.8 pg (ref 26.0–34.0)
MCHC: 33.7 g/dL (ref 30.0–36.0)
MCV: 91.5 fL (ref 80.0–100.0)
Platelets: 185 10*3/uL (ref 150–400)
RBC: 5.03 MIL/uL (ref 4.22–5.81)
RDW: 13.2 % (ref 11.5–15.5)
WBC: 7 10*3/uL (ref 4.0–10.5)
nRBC: 0 % (ref 0.0–0.2)

## 2021-04-21 LAB — URINALYSIS, ROUTINE W REFLEX MICROSCOPIC
Bilirubin Urine: NEGATIVE
Glucose, UA: NEGATIVE mg/dL
Hgb urine dipstick: NEGATIVE
Ketones, ur: NEGATIVE mg/dL
Leukocytes,Ua: NEGATIVE
Nitrite: NEGATIVE
Protein, ur: NEGATIVE mg/dL
Specific Gravity, Urine: 1.02 (ref 1.005–1.030)
pH: 6 (ref 5.0–8.0)

## 2021-04-21 LAB — BASIC METABOLIC PANEL
Anion gap: 9 (ref 5–15)
BUN: 23 mg/dL (ref 8–23)
CO2: 25 mmol/L (ref 22–32)
Calcium: 8.9 mg/dL (ref 8.9–10.3)
Chloride: 102 mmol/L (ref 98–111)
Creatinine, Ser: 0.92 mg/dL (ref 0.61–1.24)
GFR, Estimated: >60 mL/min (ref >60)
Glucose, Bld: 110 mg/dL — ABNORMAL HIGH (ref 70–99)
Potassium: 3.6 mmol/L (ref 3.5–5.1)
Sodium: 136 mmol/L (ref 135–145)

## 2021-04-21 LAB — RESP PANEL BY RT-PCR (FLU A&B, COVID) ARPGX2
Influenza A by PCR: NEGATIVE
Influenza B by PCR: NEGATIVE
SARS Coronavirus 2 by RT PCR: POSITIVE — AB

## 2021-04-21 LAB — LACTIC ACID, PLASMA
Lactic Acid, Venous: 1.4 mmol/L (ref 0.5–1.9)
Lactic Acid, Venous: 1.3 mmol/L (ref 0.5–1.9)

## 2021-04-21 LAB — TROPONIN I (HIGH SENSITIVITY)
Troponin I (High Sensitivity): 14 ng/L (ref <18)
Troponin I (High Sensitivity): 14 ng/L (ref <18)

## 2021-04-21 LAB — STREP PNEUMONIAE URINARY ANTIGEN: Strep Pneumo Urinary Antigen: NEGATIVE

## 2021-04-21 LAB — PROCALCITONIN: Procalcitonin: 0.14 ng/mL

## 2021-04-21 MED ORDER — SODIUM CHLORIDE 0.9 % IV BOLUS
500.0000 mL | Freq: Once | INTRAVENOUS | Status: AC
  Administered 2021-04-21: 14:00:00 500 mL via INTRAVENOUS

## 2021-04-21 MED ORDER — LINACLOTIDE 72 MCG PO CAPS
72.0000 ug | ORAL_CAPSULE | ORAL | Status: DC
  Administered 2021-04-21: 17:00:00 72 ug via ORAL
  Filled 2021-04-21 (×2): qty 1

## 2021-04-21 MED ORDER — ENOXAPARIN SODIUM 40 MG/0.4ML IJ SOSY
40.0000 mg | PREFILLED_SYRINGE | INTRAMUSCULAR | Status: DC
  Administered 2021-04-21 – 2021-04-22 (×2): 40 mg via SUBCUTANEOUS
  Filled 2021-04-21 (×2): qty 0.4

## 2021-04-21 MED ORDER — ONDANSETRON HCL 4 MG/2ML IJ SOLN
4.0000 mg | Freq: Once | INTRAMUSCULAR | Status: AC
  Administered 2021-04-21: 14:00:00 4 mg via INTRAVENOUS
  Filled 2021-04-21: qty 2

## 2021-04-21 MED ORDER — SODIUM CHLORIDE 0.9 % IV SOLN
1.0000 g | Freq: Once | INTRAVENOUS | Status: DC
  Filled 2021-04-21: qty 10

## 2021-04-21 MED ORDER — ENOXAPARIN SODIUM 40 MG/0.4ML IJ SOSY: 40.0000 mg | PREFILLED_SYRINGE | INTRAMUSCULAR | Status: DC

## 2021-04-21 MED ORDER — SODIUM CHLORIDE 0.9 % IV SOLN
500.0000 mg | INTRAVENOUS | Status: DC
  Administered 2021-04-22: 15:00:00 500 mg via INTRAVENOUS
  Filled 2021-04-21 (×2): qty 5

## 2021-04-21 MED ORDER — SODIUM CHLORIDE 0.9 % IV SOLN
2.0000 g | INTRAVENOUS | Status: DC
  Administered 2021-04-21 – 2021-04-22 (×2): 2 g via INTRAVENOUS
  Filled 2021-04-21 (×3): qty 20

## 2021-04-21 MED ORDER — ATENOLOL 50 MG PO TABS
50.0000 mg | ORAL_TABLET | Freq: Every day | ORAL | Status: DC
  Administered 2021-04-21 – 2021-04-23 (×2): 50 mg via ORAL
  Filled 2021-04-21: qty 2
  Filled 2021-04-21 (×2): qty 1

## 2021-04-21 MED ORDER — SODIUM CHLORIDE 0.9 % IV SOLN: INTRAVENOUS | Status: AC

## 2021-04-21 MED ORDER — TAMSULOSIN HCL 0.4 MG PO CAPS
0.4000 mg | ORAL_CAPSULE | Freq: Every day | ORAL | Status: DC
  Administered 2021-04-21 – 2021-04-23 (×3): 0.4 mg via ORAL
  Filled 2021-04-21 (×3): qty 1

## 2021-04-21 MED ORDER — ONDANSETRON HCL 4 MG PO TABS: 4.0000 mg | ORAL_TABLET | Freq: Three times a day (TID) | ORAL | Status: DC | PRN

## 2021-04-21 MED ORDER — SODIUM CHLORIDE 0.9 % IV SOLN
500.0000 mg | Freq: Once | INTRAVENOUS | Status: AC
  Administered 2021-04-21: 15:00:00 500 mg via INTRAVENOUS
  Filled 2021-04-21: qty 5

## 2021-04-21 MED ORDER — ACETAMINOPHEN 325 MG PO TABS
650.0000 mg | ORAL_TABLET | Freq: Four times a day (QID) | ORAL | Status: DC | PRN
  Filled 2021-04-21: qty 2

## 2021-04-21 NOTE — ED Provider Notes (Signed)
[facility] Emergency Department Provider Note ____________________________________________   Event Date/Time   First MD Initiated Contact with Patient 04/21/21 1331     (approximate)  I have reviewed the triage vital signs and the nursing notes.   HISTORY  Chief Complaint Weakness    HPI Joseph Osborn Joseph Osborn is a 87 y.o. male with PMH as noted below who presents with generalized weakness, acute onset today, and associated with inability to ambulate.  The patient's daughter states that he also had vomiting today and has had a cough productive of yellow phlegm.  He denies fever, chest or abdominal pain, or diarrhea.  He was diagnosed with COVID on 12/5 and have been doing better in the last several days until he worsened today.   Past Medical History:  Diagnosis Date   Arthritis    Basal cell carcinoma 02/04/2021   L groin, excised 03/23/21   BPH (benign prostatic hyperplasia)    CAD (coronary artery disease)    Chronic kidney disease    Stage III   COPD (chronic obstructive pulmonary disease) ([facility])    DDD (degenerative disc disease), cervical    Hypertension    Lipoma 2017   Lower back pain    Myocardial infarction ([place])    approx 2000   Thyroid disease    Hypothyroidism   Wears dentures    partial upper    Patient Active Problem List   Diagnosis Date Noted   Community acquired pneumonia 04/21/2021   COVID-19 04/13/2021   Acute bilateral low back pain with left-sided sciatica 02/16/2021   Bilateral hearing loss 11/27/2020   Skin macule 11/27/2020   Vitamin D deficiency 07/29/2020   Dysuria 07/28/2020   Chronic nasal congestion 05/19/2020   Gassiness 05/19/2020   Chronic cough 03/19/2020   Black stool 03/19/2020   Chronic constipation 03/19/2020   Benign prostatic hyperplasia 01/20/2016   CKD (chronic kidney disease), stage III ([place]) 01/20/2016   COPD (chronic obstructive pulmonary disease) ([place]) 01/20/2016   DDD (degenerative disc  disease), cervical 01/20/2016   Borderline hypothyroidism 01/20/2016   Osteoarthritis 01/20/2016   CAD (coronary artery disease) 10/14/2013    Past Surgical History:  Procedure Laterality Date   BLADDER SURGERY  03/2008   bladder polyp removed Yves Dill)   CARDIAC CATHETERIZATION  2000   1 stent placed after MI   CATARACT EXTRACTION W/ INTRAOCULAR LENS  IMPLANT, BILATERAL  2014   [place], Dr. George Ina   CHOLECYSTECTOMY     In [place], [place]  03/13/2014   [place], Dr. Vira Agar   COLONOSCOPY WITH PROPOFOL N/A 03/03/2016   Procedure: COLONOSCOPY WITH PROPOFOL;  Surgeon: Lucilla Lame, MD;  Location: [place];  Service: Endoscopy;  Laterality: N/A;   ESOPHAGOGASTRODUODENOSCOPY (EGD) WITH PROPOFOL N/A 03/03/2016   Procedure: ESOPHAGOGASTRODUODENOSCOPY (EGD) WITH PROPOFOL;  Surgeon: Lucilla Lame, MD;  Location: [place];  Service: Endoscopy;  Laterality: N/A;  Interpreter needed   HEMORRHOID SURGERY     [place]   TRANSURETHRAL RESECTION OF PROSTATE  03/24/2008   Dr Eliberto Ivory, [facility]    Prior to Admission medications   Medication Sig Start Date End Date Taking? Authorizing Provider  Acetaminophen (TYLENOL PO) Take by mouth at bedtime. As needed    [provider]  atenolol (TENORMIN) 50 MG tablet Take 1 tablet (50 mg total) by mouth daily. 07/22/20   Ria Bush, MD  linaclotide [facility]) 72 MCG capsule Take 1 capsule (72 mcg total) by mouth every other day. 11/27/20  Ria Bush, MD  predniSONE (DELTASONE) 10 MG tablet Take as directed 04/14/21   Tower, Wynelle Fanny, MD  tamsulosin (FLOMAX) 0.4 MG CAPS capsule Take 1 capsule (0.4 mg total) by mouth daily. 07/22/20   Ria Bush, MD    Allergies Shellfish allergy and Oxybutynin  Family History  Problem Relation Age of Onset   Heart disease Mother    Heart attack Mother    Liver cancer Cousin    Diabetes Neg Hx     Social History Social History   Tobacco Use    Smoking status: Never   Smokeless tobacco: Never  Substance Use Topics   Alcohol use: Not Currently    Comment: very rare(Holidays)   Drug use: No    Review of Systems  Constitutional: No fever.  Positive for weakness. Eyes: No redness. ENT: No sore throat. Cardiovascular: Denies chest pain. Respiratory: Denies shortness of breath.  Positive for cough. Gastrointestinal: Positive for vomiting. Genitourinary: Negative for dysuria.  Musculoskeletal: Negative for back pain. Skin: Negative for rash. Neurological: Negative for headache.   ____________________________________________   PHYSICAL EXAM:  VITAL SIGNS: ED Triage Vitals  Enc Vitals Group     BP 04/21/21 0958 (!) 145/76     Pulse Rate 04/21/21 0958 (!) 103     Resp 04/21/21 1333 17     Temp 04/21/21 0958 98.5 F (36.9 C)     Temp Source 04/21/21 0958 Oral     SpO2 04/21/21 0958 (!) 88 %     Weight --      Height --      Head Circumference --      Peak Flow --      Pain Score --      Pain Loc --      Pain Edu? --      Excl. in [place]? --     Constitutional: Alert and oriented. Well appearing for age and in no acute distress. Eyes: Conjunctivae are normal.  Head: Atraumatic. Nose: No congestion/rhinnorhea. Mouth/Throat: Mucous membranes are dry. Neck: Normal range of motion.  Cardiovascular: Normal rate, regular rhythm. Grossly normal heart sounds.  Good peripheral circulation. Respiratory: Normal respiratory effort.  No retractions. Lungs CTAB. Gastrointestinal: Soft and nontender. No distention.  Genitourinary: No flank tenderness. Musculoskeletal: No lower extremity edema.  Extremities warm and well perfused.  Neurologic:  Normal speech and language.  5/5 motor strength to all extremities. Skin:  Skin is warm and dry. No rash noted. Psychiatric: Calm and cooperative.  ____________________________________________   LABS (all labs ordered are listed, but only abnormal results are displayed)  Labs  Reviewed  BASIC METABOLIC PANEL - Abnormal; Notable for the following components:      Result Value   Glucose, Bld 110 (*)    All other components within normal limits  HEPATIC FUNCTION PANEL - Abnormal; Notable for the following components:   Total Bilirubin 1.4 (*)    Indirect Bilirubin 1.2 (*)    All other components within normal limits  RESP PANEL BY RT-PCR (FLU A&B, COVID) ARPGX2  CBC  URINALYSIS, ROUTINE W REFLEX MICROSCOPIC  LACTIC ACID, PLASMA  LACTIC ACID, PLASMA  HIV ANTIBODY (ROUTINE TESTING W REFLEX)  CBC  CREATININE, SERUM  LEGIONELLA PNEUMOPHILA SEROGP 1 UR AG  STREP PNEUMONIAE URINARY ANTIGEN  CBG MONITORING, ED  TROPONIN I (HIGH SENSITIVITY)   ____________________________________________  EKG  ED ECG REPORT I, Arta Silence, the attending physician, personally viewed and interpreted this ECG.  Date: 04/21/2021 EKG Time: 1023 Rate: 99  Rhythm: normal sinus rhythm QRS Axis: normal Intervals: LAFB ST/T Wave abnormalities: normal Narrative Interpretation: no evidence of acute ischemia  ____________________________________________  RADIOLOGY  Chest x-ray x-ray interpreted by me shows left lower lobe infiltrate  ____________________________________________   PROCEDURES  Procedure(s) performed: No  Procedures  Critical Care performed: No ____________________________________________   INITIAL IMPRESSION / ASSESSMENT AND PLAN / ED COURSE  Pertinent labs & imaging results that were available during my care of the patient were reviewed by me and considered in my medical decision making (see chart for details).   87 year old male with PMH as noted above including CAD, COPD, and CKD, as well as recent COVID-15 diagnosed on 12/5, presents with acute onset of generalized weakness today associated with vomiting and productive cough.  I reviewed the past medical records in epic and confirmed the diagnosis of COVID on 12/5.  He was prescribed steroids  and molnupiravir, and per the daughter was doing better in the last several days until today.  On exam the patient is overall well-appearing for his age.  His vital signs are normal except he is hypoxic on room air to the high 80s, up to the low 90s on 2 L by nasal cannula.  Neurologic exam is nonfocal.  Mucous membranes are dry.  Chest x-ray obtained from triage shows left lower lobe infiltrates concerning for pneumonia.  There is overall consistent with the patient's clinical presentation.  Differential includes dehydration, AKI or other metabolic etiology, long COVID, influenza, other infectious etiology such as UTI, or less likely cardiac cause.  I have ordered empiric antibiotics for CAP, fluids and Zofran, and additional lab work-up.  Plan will be for admission given the oxygen requirement.  ----------------------------------------- 2:18 PM on 04/21/2021 -----------------------------------------  I consulted Dr. Dwyane Dee from the hospitalist service for admission.  _________________________________________   FINAL CLINICAL IMPRESSION(S) / ED DIAGNOSES  Final diagnoses:  Acute respiratory failure with hypoxia ([place])  Community acquired pneumonia of left lower lobe of lung      NEW MEDICATIONS STARTED DURING THIS VISIT:  New Prescriptions   No medications on file     Note:  This document was prepared using Dragon voice recognition software and may include unintentional dictation errors.    Arta Silence, MD 04/21/21 1419

## 2021-04-21 NOTE — ED Triage Notes (Addendum)
Pt comes into the ED via EMS from home with generalized weakness with N/V this morning, was dx covid + 12/5  171/97 HR103 90%RA CBG 119 Temp 100.7

## 2021-04-21 NOTE — H&P (Addendum)
History and Physical    Joseph Osborn:[id number] DOB: 1925/09/01 DOA: 04/21/2021  PCP: Ria Bush, MD   Patient coming from: Home  I have personally briefly reviewed patient's old medical records in [place]  Chief Complaint: Generalized weakness, nausea, vomiting and productive cough.  HPI: Joseph Osborn is a 87 y.o. male with PMH significant for BPH, COPD, degenerative disc disease, CAD, chronic kidney disease presented in the ED with generalized weakness, productive cough, shortness of breath, nausea and vomiting.  History is obtained with the help of daughter as patient only speaks [place].  Patient was diagnosed with COVID-19 on December 5 and was treated with prednisone and molnupiravir, which he completed.  Patient has very mild symptoms.  He was doing much better until 2 days when he started having productive cough,  producing yellowish to brownish phlegm associated with mild shortness of breath.  Today morning patient started having nausea and vomiting and was unable to get up from the bed.  He had significant generalized weakness.  Family called EMS and was found to be febrile on EMS arrival.  He was also hypoxic requiring 2 L of supplemental oxygen.  O2 saturation 86% on room air requiring 2 L to improve SPO2 to 94%.  ED Course: He was hemodynamically stable except hypoxia and tachycardia. HR 103, temp 100.7, RR 17, BP 145/76, SPO2 88% on room air which improved to 100% on 2 L Labs include NA 136, potassium 3.6, chloride 102, bicarb 25, glucose 110, BUN 23, creatinine 0.92, calcium 8.9, anion gap 9, alkaline phosphatase 65, albumin 3.9, AST 31, ALT 36, total protein 7.1, indirect bilirubin 1.2, total bilirubin 1.4, WBC 7.0, hemoglobin 15.5, hematocrit 46.0, MCV 91.5, platelet 185, respiratory panel pending.  Chest x-ray shows left lower lobe pneumonia.  Review of Systems:  Review of Systems  Constitutional:  Positive for chills and fever.  HENT:   Positive for congestion.   Eyes: Negative.   Respiratory:  Positive for cough, sputum production and shortness of breath.   Cardiovascular: Negative.   Gastrointestinal:  Positive for nausea and vomiting.  Genitourinary: Negative.   Musculoskeletal: Negative.   Skin: Negative.   Neurological: Negative.   Endo/Heme/Allergies: Negative.   Psychiatric/Behavioral: Negative.     Past Medical History:  Diagnosis Date   Arthritis    Basal cell carcinoma 02/04/2021   L groin, excised 03/23/21   BPH (benign prostatic hyperplasia)    CAD (coronary artery disease)    Chronic kidney disease    Stage III   COPD (chronic obstructive pulmonary disease) (HCC)    DDD (degenerative disc disease), cervical    Hypertension    Lipoma 2017   Lower back pain    Myocardial infarction ([place])    approx 2000   Thyroid disease    Hypothyroidism   Wears dentures    partial upper    Past Surgical History:  Procedure Laterality Date   BLADDER SURGERY  03/2008   bladder polyp removed Yves Dill)   CARDIAC CATHETERIZATION  2000   1 stent placed after MI   CATARACT EXTRACTION W/ INTRAOCULAR LENS  IMPLANT, BILATERAL  2014   [place], Dr. George Ina   CHOLECYSTECTOMY     In [place], [place]  03/13/2014   [place], Dr. Vira Agar   COLONOSCOPY WITH PROPOFOL N/A 03/03/2016   Procedure: COLONOSCOPY WITH PROPOFOL;  Surgeon: Lucilla Lame, MD;  Location: [facility];  Service: Endoscopy;  Laterality: N/A;   ESOPHAGOGASTRODUODENOSCOPY (EGD) WITH PROPOFOL N/A  03/03/2016   Procedure: ESOPHAGOGASTRODUODENOSCOPY (EGD) WITH PROPOFOL;  Surgeon: Lucilla Lame, MD;  Location: [facility];  Service: Endoscopy;  Laterality: N/A;  Interpreter needed   HEMORRHOID SURGERY     [place] PROSTATE  03/24/2008   Dr Eliberto Ivory, [facility]     reports that he has never smoked. He has never used smokeless tobacco. He reports that he does not currently use alcohol. He reports  that he does not use drugs.  Allergies  Allergen Reactions   Shellfish Allergy Anaphylaxis   Oxybutynin Other (See Comments)    Doesn't remember reaction    Family History  Problem Relation Age of Onset   Heart disease Mother    Heart attack Mother    Liver cancer Cousin    Diabetes Neg Hx    Family history reviewed and not pertinent.  Prior to Admission medications   Medication Sig Start Date End Date Taking? Authorizing Provider  Acetaminophen (TYLENOL PO) Take by mouth at bedtime. As needed    [provider]  atenolol (TENORMIN) 50 MG tablet Take 1 tablet (50 mg total) by mouth daily. 07/22/20   Ria Bush, MD  linaclotide [facility]) 72 MCG capsule Take 1 capsule (72 mcg total) by mouth every other day. 11/27/20   Ria Bush, MD  predniSONE (DELTASONE) 10 MG tablet Take as directed 04/14/21   Tower, Wynelle Fanny, MD  tamsulosin (FLOMAX) 0.4 MG CAPS capsule Take 1 capsule (0.4 mg total) by mouth daily. 07/22/20   Ria Bush, MD    Physical Exam: Vitals:   04/21/21 0958 04/21/21 1333 04/21/21 1335  BP: (!) 145/76 117/75   Pulse: (!) 103 89 89  Resp:  17   Temp: 98.5 F (36.9 C)    TempSrc: Oral    SpO2: (!) 88% 99% 100%    Constitutional: Appears comfortable, not in any acute distress, seems very pleasant. Vitals:   04/21/21 0958 04/21/21 1333 04/21/21 1335  BP: (!) 145/76 117/75   Pulse: (!) 103 89 89  Resp:  17   Temp: 98.5 F (36.9 C)    TempSrc: Oral    SpO2: (!) 88% 99% 100%   Eyes: PERRL, lids and conjunctivae normal ENMT: Mucous membranes are moist.  Posterior pharynx without exudate.  Normal dentition.  Neck: normal, supple, no masses, no thyromegaly Respiratory: Clear to auscultation bilaterally, no wheezing, no crackles, no accessory muscle use.  RR 17 Cardiovascular: S1-S2 heard, regular rate and rhythm, no murmur. Abdomen: Abdomen is soft, nontender, nondistended, BS+ Musculoskeletal: No edema, no cyanosis, no clubbing.  Good  ROM, no contractures. Normal muscle tone.  Skin: no rashes, lesions, ulcers. No induration Neurologic: CN 2-12 grossly intact. Sensation intact, DTR normal. Strength 5/5 in all 4.  Psychiatric: Normal judgment and insight. Alert and oriented x 3. Normal mood.    Labs on Admission: I have personally reviewed following labs and imaging studies  CBC: Recent Labs  Lab 04/21/21 1030  WBC 7.0  HGB 15.5  HCT 46.0  MCV 91.5  PLT 622   Basic Metabolic Panel: Recent Labs  Lab 04/21/21 1030  NA 136  K 3.6  CL 102  CO2 25  GLUCOSE 110*  BUN 23  CREATININE 0.92  CALCIUM 8.9   GFR: CrCl cannot be calculated (Unknown ideal weight.). Liver Function Tests: Recent Labs  Lab 04/21/21 1030  AST 31  ALT 36  ALKPHOS 65  BILITOT 1.4*  PROT 7.1  ALBUMIN 3.9   No results  for input(s): LIPASE, AMYLASE in the last 168 hours. No results for input(s): AMMONIA in the last 168 hours. Coagulation Profile: No results for input(s): INR, PROTIME in the last 168 hours. Cardiac Enzymes: No results for input(s): CKTOTAL, CKMB, CKMBINDEX, TROPONINI in the last 168 hours. BNP (last 3 results) No results for input(s): PROBNP in the last 8760 hours. HbA1C: No results for input(s): HGBA1C in the last 72 hours. CBG: No results for input(s): GLUCAP in the last 168 hours. Lipid Profile: No results for input(s): CHOL, HDL, LDLCALC, TRIG, CHOLHDL, LDLDIRECT in the last 72 hours. Thyroid Function Tests: No results for input(s): TSH, T4TOTAL, FREET4, T3FREE, THYROIDAB in the last 72 hours. Anemia Panel: No results for input(s): VITAMINB12, FOLATE, FERRITIN, TIBC, IRON, RETICCTPCT in the last 72 hours. Urine analysis:    Component Value Date/Time   COLORURINE YELLOW (A) 09/08/2017 2300   APPEARANCEUR CLEAR (A) 09/08/2017 2300   LABSPEC 1.011 09/08/2017 2300   PHURINE 6.0 09/08/2017 2300   GLUCOSEU NEGATIVE 09/08/2017 2300   HGBUR NEGATIVE 09/08/2017 2300   BILIRUBINUR negative 02/16/2021 0856    KETONESUR 5 (A) 09/08/2017 2300   PROTEINUR Negative 02/16/2021 0856   PROTEINUR NEGATIVE 09/08/2017 2300   UROBILINOGEN 0.2 02/16/2021 0856   NITRITE negative 02/16/2021 0856   NITRITE NEGATIVE 09/08/2017 2300   LEUKOCYTESUR Large (3+) (A) 02/16/2021 0856    Radiological Exams on Admission: DG Chest 2 View  Result Date: 04/21/2021 CLINICAL DATA:  Recent COVID Hypoxia EXAM: CHEST - 2 VIEW COMPARISON:  03/17/2020 FINDINGS: Heart size within normal limits. Left basilar airspace opacities suspicious for pneumonia. Minimal strandy opacities at the right lateral lung base likely atelectasis. No pulmonary vascular congestion. IMPRESSION: Findings suspicious for left lower lobe pneumonia. Electronically Signed   By: Miachel Roux M.D.   On: 04/21/2021 10:59    EKG: Independently reviewed.  Normal sinus rhythm, left anterior fascicular block.  Assessment/Plan Principal Problem:   Community acquired pneumonia Active Problems:   Benign prostatic hyperplasia   CAD (coronary artery disease)   CKD (chronic kidney disease), stage III (HCC)   COPD (chronic obstructive pulmonary disease) (HCC)   DDD (degenerative disc disease), cervical   Borderline hypothyroidism   Osteoarthritis   Bilateral hearing loss   Acute hypoxic respiratory failure secondary to community-acquired pneumonia: Community-acquired pneumonia Patient presented with productive cough, generalized weakness, shortness of breath and hypoxia. SPO2 on room air on arrival 86% which improved to 92% on 2 L. Continue supplemental oxygen to keep saturation above 94%. Chest x-ray consistent with left lower lobe pneumonia. Continue empiric antibiotics ceftriaxone and Zithromax for 5 days Obtain procalcitonin level.  Follow-up respiratory panel. Obtain streptococcal pneumonia and Legionella urine antigen. Follow-up blood cultures,  Generalized weakness: It could be due to pneumonia, recent COVID infection, nausea and vomiting Continue  IV gentle hydration, IV antibiotics PT and OT evaluation  COVID+: Patient was [facility] December 5 and was treated with oral prednisone and molnupiravir. He is out of isolation window, continue supportive care.  BPH: Continue Flomax  Essential hypertension: Continue atenolol   DVT prophylaxis: Lovenox Code Status: Full code. Family Communication: Daughter at bed side. Disposition Plan:   Status is: Inpatient  Remains inpatient appropriate because: Admitted for acute hypoxic respiratory failure secondary to community-acquired pneumonia requiring IV antibiotics.  Consults called: None Admission status: Inpatient   Shawna Clamp MD [facility]   If 7PM-7AM, please contact night-coverage   04/21/2021, 2:51 PM

## 2021-04-22 ENCOUNTER — Ambulatory Visit: Payer: Medicare Other | Admitting: Dermatology

## 2021-04-22 LAB — COMPREHENSIVE METABOLIC PANEL
ALT: 26 U/L (ref 0–44)
AST: 22 U/L (ref 15–41)
Albumin: 3.2 g/dL — ABNORMAL LOW (ref 3.5–5.0)
Alkaline Phosphatase: 51 U/L (ref 38–126)
Anion gap: 8 (ref 5–15)
BUN: 19 mg/dL (ref 8–23)
CO2: 26 mmol/L (ref 22–32)
Calcium: 8 mg/dL — ABNORMAL LOW (ref 8.9–10.3)
Chloride: 102 mmol/L (ref 98–111)
Creatinine, Ser: 1.01 mg/dL (ref 0.61–1.24)
GFR, Estimated: >60 mL/min (ref >60)
Glucose, Bld: 96 mg/dL (ref 70–99)
Potassium: 3.9 mmol/L (ref 3.5–5.1)
Sodium: 136 mmol/L (ref 135–145)
Total Bilirubin: 1.1 mg/dL (ref 0.3–1.2)
Total Protein: 6 g/dL — ABNORMAL LOW (ref 6.5–8.1)

## 2021-04-22 LAB — MRSA NEXT GEN BY PCR, NASAL: MRSA by PCR Next Gen: NOT DETECTED

## 2021-04-22 LAB — CBC
HCT: 40.3 % (ref 39.0–52.0)
Hemoglobin: 13.5 g/dL (ref 13.0–17.0)
MCH: 30.8 pg (ref 26.0–34.0)
MCHC: 33.5 g/dL (ref 30.0–36.0)
MCV: 91.8 fL (ref 80.0–100.0)
Platelets: 140 10*3/uL — ABNORMAL LOW (ref 150–400)
RBC: 4.39 MIL/uL (ref 4.22–5.81)
RDW: 13.2 % (ref 11.5–15.5)
WBC: 6.7 10*3/uL (ref 4.0–10.5)
nRBC: 0 % (ref 0.0–0.2)

## 2021-04-22 LAB — LEGIONELLA PNEUMOPHILA SEROGP 1 UR AG: L. pneumophila Serogp 1 Ur Ag: NEGATIVE

## 2021-04-22 LAB — HIV ANTIBODY (ROUTINE TESTING W REFLEX): HIV Screen 4th Generation wRfx: NONREACTIVE

## 2021-04-22 LAB — MAGNESIUM: Magnesium: 2 mg/dL (ref 1.7–2.4)

## 2021-04-22 LAB — PHOSPHORUS: Phosphorus: 2.3 mg/dL — ABNORMAL LOW (ref 2.5–4.6)

## 2021-04-22 MED ORDER — K PHOS MONO-SOD PHOS DI & MONO 155-852-130 MG PO TABS
500.0000 mg | ORAL_TABLET | ORAL | Status: AC
  Administered 2021-04-22 (×2): 500 mg via ORAL
  Filled 2021-04-22 (×2): qty 2

## 2021-04-22 MED ORDER — ASCORBIC ACID 500 MG PO TABS
500.0000 mg | ORAL_TABLET | Freq: Every day | ORAL | Status: DC
  Administered 2021-04-22 – 2021-04-23 (×2): 500 mg via ORAL
  Filled 2021-04-22 (×2): qty 1

## 2021-04-22 MED ORDER — IPRATROPIUM-ALBUTEROL 20-100 MCG/ACT IN AERS
1.0000 | INHALATION_SPRAY | Freq: Four times a day (QID) | RESPIRATORY_TRACT | Status: DC
  Administered 2021-04-22 – 2021-04-23 (×4): 1 via RESPIRATORY_TRACT
  Filled 2021-04-22: qty 4

## 2021-04-22 MED ORDER — SODIUM PHOSPHATES 45 MMOLE/15ML IV SOLN: 15.0000 mmol | Freq: Once | INTRAVENOUS | Status: DC

## 2021-04-22 MED ORDER — ZINC SULFATE 220 (50 ZN) MG PO CAPS
220.0000 mg | ORAL_CAPSULE | Freq: Every day | ORAL | Status: DC
  Administered 2021-04-22 – 2021-04-23 (×2): 220 mg via ORAL
  Filled 2021-04-22 (×2): qty 1

## 2021-04-22 MED ORDER — ORAL CARE MOUTH RINSE
15.0000 mL | Freq: Two times a day (BID) | OROMUCOSAL | Status: DC
  Administered 2021-04-22 – 2021-04-23 (×2): 15 mL via OROMUCOSAL

## 2021-04-22 NOTE — Progress Notes (Signed)
PROGRESS NOTE    Joseph Osborn  QJJ:[id number] DOB: [birth year] DOA: 04/21/2021 PCP: Ria Bush, MD   Brief Narrative: Taken from H&P. Joseph Osborn is a 87 y.o. male with PMH significant for BPH, COPD, degenerative disc disease, CAD, chronic kidney disease presented in the ED with generalized weakness, productive cough, shortness of breath, nausea and vomiting for the past 2 days. Patient was diagnosed with COVID-19 on December 5 and was treated with prednisone  and molnupiravir, and completed the course.  Patient never had much symptoms. On arrival he was tachycardic, febrile at 100.7, hypoxic at 88% on room air requiring 2 L of oxygen, no baseline oxygen use.  No leukocytosis.  Chest x-ray with left lower lobe pneumonia. Started on ceftriaxone and Zithromax  Subjective: Patient was seen and examined today.  Daughter was used as Astronomer as patient is a Spanish-speaking gentleman.  Denies any new complaints.  No chest pain or shortness of breath.  No baseline oxygen use.  He ate his breakfast without any nausea or vomiting.  Feeling that he is improving.  Assessment & Plan:   Principal Problem:   Community acquired pneumonia Active Problems:   Benign prostatic hyperplasia   CAD (coronary artery disease)   CKD (chronic kidney disease), stage III (HCC)   COPD (chronic obstructive pulmonary disease) (HCC)   DDD (degenerative disc disease), cervical   Borderline hypothyroidism   Osteoarthritis   Bilateral hearing loss  Acute hypoxic respiratory failure secondary to community-acquired pneumonia.  Most likely bacterial after getting COVID-19 infection.  Clinically seems improving on current regimen.  Strep pneumo negative, Legionella pending.  Procalcitonin at 0.14 -Check MRSA PCR. -Continue ceftriaxone and Zithromax -Try weaning from oxygen -Continue with supportive care  Generalized weakness.  Most likely secondary to recent COVID infection and  current. Received IV fluid in ED. also found to have mild hypophosphatemia. -Replete phosphorus -PT/OT evaluation  Essential hypertension.  Blood pressure within goal. -Continue home dose of atenolol  BPH. -Continue Flomax  Objective: Vitals:   04/22/21 0056 04/22/21 0541 04/22/21 0825 04/22/21 1212  BP: 126/62 (!) 103/55 92/70 99/65   Pulse: 68 79 81 74  Resp: 16 16 16 16   Temp: 98.3 F (36.8 C) 99.4 F (37.4 C) 98.8 F (37.1 C) (!) 97.3 F (36.3 C)  TempSrc:   Oral Oral  SpO2: 98% 94% 93% 96%  Height:        Intake/Output Summary (Last 24 hours) at 04/22/2021 1320 Last data filed at 04/22/2021 1000 Gross per 24 hour  Intake 1252.4 ml  Output --  Net 1252.4 ml   There were no vitals filed for this visit.  Examination:  General exam: Appears calm and comfortable  Respiratory system: Clear to auscultation. Respiratory effort normal. Cardiovascular system: S1 & S2 heard, RRR. No JVD, murmurs, rubs, gallops or clicks. Gastrointestinal system: Soft, nontender, nondistended, bowel sounds positive. Central nervous system: Alert and oriented. No focal neurological deficits.Symmetric 5 x 5 power. Extremities: No edema, no cyanosis, pulses intact and symmetrical. Psychiatry: Judgement and insight appear normal.    DVT prophylaxis: Lovenox Code Status: Full Family Communication: Discussed with daughter at bedside Disposition Plan:  Status is: Inpatient  Remains inpatient appropriate because: Severity of illness. Can be discharged tomorrow if able to wean off from oxygen.   Level of care: Telemetry Medical  All the records are reviewed and case discussed with Care Management/Social Worker. Management plans discussed with the patient, nursing and they are in agreement.  Consultants:  None  Procedures:  Antimicrobials:  Ceftriaxone Zithromax  Data Reviewed: I have personally reviewed following labs and imaging studies  CBC: Recent Labs  Lab 04/21/21 1030  04/22/21 0528  WBC 7.0 6.7  HGB 15.5 13.5  HCT 46.0 40.3  MCV 91.5 91.8  PLT 185 161*   Basic Metabolic Panel: Recent Labs  Lab 04/21/21 1030 04/22/21 0528  NA 136 136  K 3.6 3.9  CL 102 102  CO2 25 26  GLUCOSE 110* 96  BUN 23 19  CREATININE 0.92 1.01  CALCIUM 8.9 8.0*  MG  --  2.0  PHOS  --  2.3*   GFR: CrCl cannot be calculated (Unknown ideal weight.). Liver Function Tests: Recent Labs  Lab 04/21/21 1030 04/22/21 0528  AST 31 22  ALT 36 26  ALKPHOS 65 51  BILITOT 1.4* 1.1  PROT 7.1 6.0*  ALBUMIN 3.9 3.2*   No results for input(s): LIPASE, AMYLASE in the last 168 hours. No results for input(s): AMMONIA in the last 168 hours. Coagulation Profile: No results for input(s): INR, PROTIME in the last 168 hours. Cardiac Enzymes: No results for input(s): CKTOTAL, CKMB, CKMBINDEX, TROPONINI in the last 168 hours. BNP (last 3 results) No results for input(s): PROBNP in the last 8760 hours. HbA1C: No results for input(s): HGBA1C in the last 72 hours. CBG: No results for input(s): GLUCAP in the last 168 hours. Lipid Profile: No results for input(s): CHOL, HDL, LDLCALC, TRIG, CHOLHDL, LDLDIRECT in the last 72 hours. Thyroid Function Tests: No results for input(s): TSH, T4TOTAL, FREET4, T3FREE, THYROIDAB in the last 72 hours. Anemia Panel: No results for input(s): VITAMINB12, FOLATE, FERRITIN, TIBC, IRON, RETICCTPCT in the last 72 hours. Sepsis Labs: Recent Labs  Lab 04/21/21 1416 04/21/21 1554  PROCALCITON 0.14  --   LATICACIDVEN 1.4 1.3    Recent Results (from the past 240 hour(s))  Resp Panel by RT-PCR (Flu A&B, Covid)     Status: Abnormal   Collection Time: 04/21/21  2:58 PM  Result Value Ref Range Status   SARS Coronavirus 2 by RT PCR POSITIVE (A) NEGATIVE Final    Comment: (NOTE) SARS-CoV-2 target nucleic acids are DETECTED.  The SARS-CoV-2 RNA is generally detectable in upper respiratory specimens during the acute phase of infection. Positive  results are indicative of the presence of the identified virus, but do not rule out bacterial infection or co-infection with other pathogens not detected by the test. Clinical correlation with patient history and other diagnostic information is necessary to determine patient infection status. The expected result is Negative.  Fact Sheet for Patients: EntrepreneurPulse.com.au  Fact Sheet for Healthcare Providers: IncredibleEmployment.be  This test is not yet approved or cleared by the [place] FDA and  has been authorized for detection and/or diagnosis of SARS-CoV-2 by FDA under an Emergency Use Authorization (EUA).  This EUA will remain in effect (meaning this test can be used) for the duration of  the COVID-19 declaration under Section 564(b)(1) of the A ct, 21 U.S.C. section 360bbb-3(b)(1), unless the authorization is terminated or revoked sooner.     Influenza A by PCR NEGATIVE NEGATIVE Final   Influenza B by PCR NEGATIVE NEGATIVE Final    Comment: (NOTE) The Xpert Xpress SARS-CoV-2/FLU/RSV plus assay is intended as an aid in the diagnosis of influenza from Nasopharyngeal swab specimens and should not be used as a sole basis for treatment. Nasal washings and aspirates are unacceptable for Xpert Xpress SARS-CoV-2/FLU/RSV testing.  Fact Sheet for Patients: EntrepreneurPulse.com.au  Fact Sheet for  Healthcare Providers: IncredibleEmployment.be  This test is not yet approved or cleared by the [place] and has been authorized for detection and/or diagnosis of SARS-CoV-2 by FDA under an Emergency Use Authorization (EUA). This EUA will remain in effect (meaning this test can be used) for the duration of the COVID-19 declaration under Section 564(b)(1) of the Act, 21 U.S.C. section 360bbb-3(b)(1), unless the authorization is terminated or revoked.  Performed at [facility], [address] [place], [place] [address]      Radiology Studies: DG Chest 2 View  Result Date: 04/21/2021 CLINICAL DATA:  Recent COVID Hypoxia EXAM: CHEST - 2 VIEW COMPARISON:  03/17/2020 FINDINGS: Heart size within normal limits. Left basilar airspace opacities suspicious for pneumonia. Minimal strandy opacities at the right lateral lung base likely atelectasis. No pulmonary vascular congestion. IMPRESSION: Findings suspicious for left lower lobe pneumonia. Electronically Signed   By: Miachel Roux M.D.   On: 04/21/2021 10:59    Scheduled Meds:  vitamin C  500 mg Oral Daily   atenolol  50 mg Oral Daily   enoxaparin (LOVENOX) injection  40 mg Subcutaneous Q24H   Ipratropium-Albuterol  1 puff Inhalation Q6H   linaclotide  72 mcg Oral QODAY   mouth rinse  15 mL Mouth Rinse BID   tamsulosin  0.4 mg Oral Daily   zinc sulfate  220 mg Oral Daily   Continuous Infusions:  azithromycin     cefTRIAXone (ROCEPHIN)  IV Stopped (04/21/21 1644)     LOS: 1 day   Time spent: 40 minutes. More than 50% of the time was spent in counseling/coordination of care  Lorella Nimrod, MD [facility]  If 7PM-7AM, please contact night-coverage [email or web address]  04/22/2021, 1:20 PM   This record has been created using Systems analyst. Errors have been sought and corrected,but may not always be located. Such creation errors do not reflect on the standard of care.

## 2021-04-22 NOTE — TOC Initial Note (Signed)
Transition of Care [facility]) - Initial/Assessment Note    Patient Details  Name: Joseph Osborn MRN: [id number] Date of Birth: [birth year]  [facility] CM/SW Contact:    Kerin Salen, RN Phone Number: 04/22/2021, 9:54 AM  Clinical Narrative: Patient speaks Spanish and on isolation precautions, spoke with daughter Joseph Osborn who lives in the home. Joseph Osborn indicates that patient was independent with ADL's, use rolling walker at times, and walking exercise often. Never had [place] services, no other medical equipment used. [facility], daughter provides transportation, use [place] on [address]. Cooking and shopping done by daughter and spouse. Daughter voices need for [facility] services, prefer Spanish speaking agency, notified [facility], was informed that they provide Spanish interpretive services. TOC to continue to track for discharge needs.                  Expected Discharge Plan: [place] Barriers to Discharge: Continued Medical Work up   Patient Goals and CMS Choice Patient states their goals for this hospitalization and ongoing recovery are:: To return home per daughter.      Expected Discharge Plan and Services Expected Discharge Plan: [place] In-house Referral: Clinical Social Work   Post Acute Care Choice: [place] arrangements for the past 2 months: [place] Agency: [place] ([facility]) Date HH Agency Contacted: 04/22/21 Time [place]: [phone] Representative spoke with at [place]: [place] Arrangements/Services Living arrangements for the past 2 months: [place] with:: Adult Children, Spouse Patient language and need for interpreter reviewed::  (Spoke with daughter) Do you feel safe going back to the place where you live?:  (Spoke with daughter)      Need for Family Participation in Patient Care: Yes  (Comment) Care giver support system in place?: Yes (comment)   Criminal Activity/Legal Involvement Pertinent to Current Situation/Hospitalization: No - Comment as needed  Activities of Daily Living Home Assistive Devices/Equipment: Dentures (specify type), Walker (specify type) ADL Screening (condition at time of admission) Patient's cognitive ability adequate to safely complete daily activities?: Yes Is the patient deaf or have difficulty hearing?: No Does the patient have difficulty seeing, even when wearing glasses/contacts?: No Does the patient have difficulty concentrating, remembering, or making decisions?: No Patient able to express need for assistance with ADLs?: Yes Does the patient have difficulty dressing or bathing?: No Independently performs ADLs?: Yes (appropriate for developmental age) Does the patient have difficulty walking or climbing stairs?: Yes Weakness of Legs: None Weakness of Arms/Hands: None  Permission Sought/Granted                  Emotional Assessment         Alcohol / Substance Use: Not Applicable Psych Involvement: No (comment)  Admission diagnosis:  Community acquired pneumonia [J18.9] Acute respiratory failure with hypoxia ([employer]) [J96.01] Community acquired pneumonia of left lower lobe of lung [J18.9] Patient Active Problem List   Diagnosis Date Noted   Community acquired pneumonia 04/21/2021   COVID-19 04/13/2021   Acute bilateral low back pain with left-sided sciatica 02/16/2021   Bilateral hearing loss 11/27/2020   Skin macule 11/27/2020   Vitamin D deficiency 07/29/2020   Dysuria 07/28/2020   Chronic nasal congestion 05/19/2020   Gassiness  05/19/2020   Chronic cough 03/19/2020   Black stool 03/19/2020   Chronic constipation 03/19/2020   Benign prostatic hyperplasia 01/20/2016   CKD (chronic kidney disease), stage III ([place]) 01/20/2016   COPD (chronic obstructive pulmonary disease) ([place]) 01/20/2016   DDD (degenerative disc  disease), cervical 01/20/2016   Borderline hypothyroidism 01/20/2016   Osteoarthritis 01/20/2016   CAD (coronary artery disease) 10/14/2013   PCP:  Ria Bush, MD Pharmacy:   [facility] [address] [place] [facility] [place] [address] [place] [address] Phone: [phone] Fax: [phone]     Social Determinants of Health (SDOH) Interventions    Readmission Risk Interventions No flowsheet data found.

## 2021-04-23 DIAGNOSIS — N1831 Chronic kidney disease, stage 3a: Secondary | ICD-10-CM

## 2021-04-23 DIAGNOSIS — R3912 Poor urinary stream: Secondary | ICD-10-CM

## 2021-04-23 DIAGNOSIS — J9601 Acute respiratory failure with hypoxia: Secondary | ICD-10-CM

## 2021-04-23 DIAGNOSIS — N401 Enlarged prostate with lower urinary tract symptoms: Secondary | ICD-10-CM

## 2021-04-23 LAB — PHOSPHORUS: Phosphorus: 3.6 mg/dL (ref 2.5–4.6)

## 2021-04-23 MED ORDER — ASCORBIC ACID 500 MG PO TABS: 500.0000 mg | ORAL_TABLET | Freq: Every day | ORAL | 1 refills | Status: DC

## 2021-04-23 MED ORDER — ZINC SULFATE 220 (50 ZN) MG PO CAPS: 220.0000 mg | ORAL_CAPSULE | Freq: Every day | ORAL | 1 refills | Status: DC

## 2021-04-23 MED ORDER — CEFDINIR 300 MG PO CAPS: 300.0000 mg | ORAL_CAPSULE | Freq: Two times a day (BID) | ORAL | 0 refills | Status: AC

## 2021-04-23 MED ORDER — AZITHROMYCIN 500 MG PO TABS: 500.0000 mg | ORAL_TABLET | Freq: Every day | ORAL | 0 refills | Status: AC

## 2021-04-23 MED ORDER — ONDANSETRON HCL 4 MG PO TABS
4.0000 mg | ORAL_TABLET | Freq: Three times a day (TID) | ORAL | 0 refills | Status: DC | PRN
Start: 2021-04-23 — End: 2021-07-23

## 2021-04-23 NOTE — Care Management Important Message (Signed)
Important Message  Patient Details  Name: Joseph Osborn MRN: [id number] Date of Birth: 12-02-1925   Medicare Important Message Given:  N/A - LOS <3 / Initial given by admissions     Juliann Pulse A Keegen Heffern 04/23/2021, 11:28 AM

## 2021-04-23 NOTE — Evaluation (Signed)
Physical Therapy Evaluation Patient Details Name: Amr Sturtevant MRN: [id number] DOB: 1925/08/18 Today's Date: 04/23/2021  History of Present Illness  Pt is a 87 y/o M admitted on 04/21/21 with c/c of productive cough, SOB, N&V x 2 days. Pt was recently diagnosed with Covid 19 on 04/12/21 & treated for it. Chest x-ray revealed left lower lobe PNA. PMH: BPH, COPD, DDD, CAD, CKD, B hearing loss, OA  Clinical Impression  Pt seen for PT evaluation with daughter declining ipad interpreter & assisting with communication. Prior to getting sick pt was independent without AD but since having PNA has required use of RW. On this date, pt is able to ambulate 1 lap around the nurses station with RW & CGA with cuing to ambulate within base of AD & CGA without AD. Provided family with gait belt & educated them to continue using RW until pt feels more comfortable & steady when ambulating without AD. Discussed PT f/u & pt & family feel that once pt's PNA is treated he will return to baseline level of function.       Recommendations for follow up therapy are one component of a multi-disciplinary discharge planning process, led by the attending physician.  Recommendations may be updated based on patient status, additional functional criteria and insurance authorization.  Follow Up Recommendations No PT follow up    Assistance Recommended at Discharge Intermittent Supervision/Assistance  Functional Status Assessment  (minimal decline)  Equipment Recommendations  None recommended by PT    Recommendations for Other Services       Precautions / Restrictions Precautions Precautions: None Restrictions Weight Bearing Restrictions: No      Mobility  Bed Mobility Overal bed mobility: Independent                  Transfers Overall transfer level: Independent Equipment used: Rolling walker (2 wheels) (instructional cuing for safe hand placement during sit>stand with good return demo)                     Ambulation/Gait Ambulation/Gait assistance: Supervision;Min guard Gait Distance (Feet):  (120 ft with RW & supervision, 55 ft no AD with CGA) Assistive device: Rolling walker (2 wheels);None Gait Pattern/deviations: Decreased step length - right;Decreased step length - left;Decreased stride length       General Gait Details: Education to ambulate within base of AD when using RW  Stairs            Wheelchair Mobility    Modified Rankin (Stroke Patients Only)       Balance Overall balance assessment: Modified Independent   Sitting balance-Leahy Scale: Normal     Standing balance support: Bilateral upper extremity supported;During functional activity Standing balance-Leahy Scale: Fair                               Pertinent Vitals/Pain Pain Assessment: No/denies pain    Home Living Family/patient expects to be discharged to:: Private residence Living Arrangements: Children;Spouse/significant other Available Help at Discharge: Family;Available 24 hours/day Type of Home: House Home Access: Ramped entrance       Home Layout: One level Home Equipment: Conservation officer, nature (2 wheels)      Prior Function Prior Level of Function : Independent/Modified Independent             Mobility Comments: Pt was ambulatory without AD but when he became ill 2/2 PNA he began using RW. Pt had 1 fall  prior to admission. Daughter takes parents to the mall to ambulate.       Hand Dominance        Extremity/Trunk Assessment   Upper Extremity Assessment Upper Extremity Assessment: Overall WFL for tasks assessed    Lower Extremity Assessment Lower Extremity Assessment: Generalized weakness    Cervical / Trunk Assessment Cervical / Trunk Assessment: Normal  Communication   Communication:  (Daughter [name] assisted with communication & declined ipad interpreter as pt is spanish speaking)  Cognition Arousal/Alertness: Awake/alert Behavior During  Therapy: WFL for tasks assessed/performed Overall Cognitive Status: Within Functional Limits for tasks assessed                                          General Comments      Exercises     Assessment/Plan    PT Assessment Patient does not need any further PT services  PT Problem List         PT Treatment Interventions      PT Goals (Current goals can be found in the Care Plan section)  Acute Rehab PT Goals Patient Stated Goal: go home PT Goal Formulation: With patient/family Time For Goal Achievement: 05/07/21 Potential to Achieve Goals: Good    Frequency     Barriers to discharge        Co-evaluation               AM-PAC PT "6 Clicks" Mobility  Outcome Measure Help needed turning from your back to your side while in a flat bed without using bedrails?: None Help needed moving from lying on your back to sitting on the side of a flat bed without using bedrails?: None Help needed moving to and from a bed to a chair (including a wheelchair)?: None Help needed standing up from a chair using your arms (e.g., wheelchair or bedside chair)?: None Help needed to walk in hospital room?: A Little Help needed climbing 3-5 steps with a railing? : A Little 6 Click Score: 22    End of Session Equipment Utilized During Treatment: Gait belt Activity Tolerance: Patient tolerated treatment well Patient left: in bed;with family/visitor present Nurse Communication: Mobility status      Time: 2774-1287 PT Time Calculation (min) (ACUTE ONLY): 17 min   Charges:   PT Evaluation $PT Eval Low Complexity: 1 Low          Lavone Nian, PT, DPT 04/23/21, 1:20 PM   Waunita Schooner 04/23/2021, 1:18 PM

## 2021-04-23 NOTE — Discharge Summary (Signed)
Physician Discharge Summary  Joseph Osborn ZSW:[id number] DOB: [birth year] DOA: 04/21/2021  PCP: Ria Bush, MD  Admit date: 04/21/2021 Discharge date: 04/23/2021  Admitted From: Home Disposition: Home  Recommendations for Outpatient Follow-up:  Follow up with PCP in 1-2 weeks Please obtain BMP/CBC in one week Please follow up on the following pending results: None  Home Health: No Equipment/Devices: Rolling walker Discharge Condition: Stable CODE STATUS: Full Diet recommendation: Heart Healthy  Brief/Interim Summary: Joseph Osborn is a 87 y.o. male with PMH significant for BPH, COPD, degenerative disc disease, CAD, chronic kidney disease presented in the ED with generalized weakness, productive cough, shortness of breath, nausea and vomiting for the past 2 days. Patient was diagnosed with COVID-19 on December 5 and was treated with prednisone  and molnupiravir, and completed the course.  Patient never had much symptoms. On arrival he was tachycardic, febrile at 100.7, hypoxic at 88% on room air requiring 2 L of oxygen, no baseline oxygen use.  No leukocytosis.  Chest x-ray with left lower lobe pneumonia. Started on ceftriaxone and Zithromax while in the hospital and discharged on cefdinir and Zithromax to complete the course.  Remained afebrile for more than 24 hours.  Our physical therapist evaluated him and due to his complaint of generalized weakness and recommended home health PT, family does not want to have home health services at this time.  They will contact his PCP if needed.  He was also found to have mild hypophosphatemia which were repleted before discharge.  Generalized weakness most likely multifactorial with recent COVID infection, hypophosphatemia and pneumonia.  Will continue rest of his home medications and follow-up with his providers.  Discharge Diagnoses:  Principal Problem:   Community acquired pneumonia Active Problems:   Benign prostatic  hyperplasia   CAD (coronary artery disease)   CKD (chronic kidney disease), stage III (HCC)   COPD (chronic obstructive pulmonary disease) (HCC)   DDD (degenerative disc disease), cervical   Borderline hypothyroidism   Osteoarthritis   Bilateral hearing loss   Acute respiratory failure with hypoxia [facility])   Discharge Instructions  Discharge Instructions     Diet - low sodium heart healthy   Complete by: As directed    Discharge instructions   Complete by: As directed    It was pleasure taking care of you. You are being given antibiotics for your pneumonia for few days, please take it as directed. You are also being given Zofran to be used only as needed for nausea and vomiting. Keep yourself well-hydrated and follow-up with your primary care doctor for further management.   Increase activity slowly   Complete by: As directed       Allergies as of 04/23/2021       Reactions   Shellfish Allergy Anaphylaxis   Oxybutynin Other (See Comments)   Doesn't remember reaction        Medication List     STOP taking these medications    predniSONE 10 MG tablet Commonly known as: DELTASONE       TAKE these medications    ascorbic acid 500 MG tablet Commonly known as: VITAMIN C Take 1 tablet (500 mg total) by mouth daily.   atenolol 50 MG tablet Commonly known as: TENORMIN Take 1 tablet (50 mg total) by mouth daily.   azithromycin 500 MG tablet Commonly known as: ZITHROMAX Take 1 tablet (500 mg total) by mouth daily for 3 days.   cefdinir 300 MG capsule Commonly known as: OMNICEF Take 1 capsule (  300 mg total) by mouth 2 (two) times daily for 5 days.   linaclotide 72 MCG capsule Commonly known as: LINZESS Take 1 capsule (72 mcg total) by mouth every other day.   ondansetron 4 MG tablet Commonly known as: ZOFRAN Take 1 tablet (4 mg total) by mouth every 8 (eight) hours as needed for nausea or vomiting.   tamsulosin 0.4 MG Caps capsule Commonly known as:  FLOMAX Take 1 capsule (0.4 mg total) by mouth daily.   TYLENOL PO Take by mouth at bedtime. As needed   zinc sulfate 220 (50 Zn) MG capsule Take 1 capsule (220 mg total) by mouth daily.        Follow-up Information     Ria Bush, MD. Schedule an appointment as soon as possible for a visit in 1 week(s).   Specialty: Family Medicine Contact information: [address] [place] [address] [phone]                Allergies  Allergen Reactions   Shellfish Allergy Anaphylaxis   Oxybutynin Other (See Comments)    Doesn't remember reaction    Consultations: None  Procedures/Studies: DG Chest 2 View  Result Date: 04/21/2021 CLINICAL DATA:  Recent COVID Hypoxia EXAM: CHEST - 2 VIEW COMPARISON:  03/17/2020 FINDINGS: Heart size within normal limits. Left basilar airspace opacities suspicious for pneumonia. Minimal strandy opacities at the right lateral lung base likely atelectasis. No pulmonary vascular congestion. IMPRESSION: Findings suspicious for left lower lobe pneumonia. Electronically Signed   By: Miachel Roux M.D.   On: 04/21/2021 10:59    Subjective: Patient was seen and examined today.  No new complaints.  Wants to go home.  Daughter and wife in room.  Daughter helped with interpretation as he is a Spanish-speaking gentleman. They do not want any home health services at this time.  Patient appears at his baseline.  Discharge Exam: Vitals:   04/23/21 0532 04/23/21 0813  BP: 127/67 120/62  Pulse: 78 76  Resp: 16 16  Temp: 97.6 F (36.4 C) 98.1 F (36.7 C)  SpO2: 95% 93%   Vitals:   04/22/21 2045 04/23/21 0030 04/23/21 0532 04/23/21 0813  BP:  118/66 127/67 120/62  Pulse:  84 78 76  Resp:  16 16 16   Temp:  97.8 F (36.6 C) 97.6 F (36.4 C) 98.1 F (36.7 C)  TempSrc:  Oral Oral Oral  SpO2: 93% 93% 95% 93%  Height:        General: Pt is alert, awake, not in acute distress Cardiovascular: RRR, S1/S2 +, no rubs, no  gallops Respiratory: CTA bilaterally, no wheezing, no rhonchi Abdominal: Soft, NT, ND, bowel sounds + Extremities: no edema, no cyanosis   The results of significant diagnostics from this hospitalization (including imaging, microbiology, ancillary and laboratory) are listed below for reference.    Microbiology: Recent Results (from the past 240 hour(s))  Resp Panel by RT-PCR (Flu A&B, Covid)     Status: Abnormal   Collection Time: 04/21/21  2:58 PM  Result Value Ref Range Status   SARS Coronavirus 2 by RT PCR POSITIVE (A) NEGATIVE Final    Comment: (NOTE) SARS-CoV-2 target nucleic acids are DETECTED.  The SARS-CoV-2 RNA is generally detectable in upper respiratory specimens during the acute phase of infection. Positive results are indicative of the presence of the identified virus, but do not rule out bacterial infection or co-infection with other pathogens not detected by the test. Clinical correlation with patient history and other diagnostic  information is necessary to determine patient infection status. The expected result is Negative.  Fact Sheet for Patients: EntrepreneurPulse.com.au  Fact Sheet for Healthcare Providers: IncredibleEmployment.be  This test is not yet approved or cleared by the [place] FDA and  has been authorized for detection and/or diagnosis of SARS-CoV-2 by FDA under an Emergency Use Authorization (EUA).  This EUA will remain in effect (meaning this test can be used) for the duration of  the COVID-19 declaration under Section 564(b)(1) of the A ct, 21 U.S.C. section 360bbb-3(b)(1), unless the authorization is terminated or revoked sooner.     Influenza A by PCR NEGATIVE NEGATIVE Final   Influenza B by PCR NEGATIVE NEGATIVE Final    Comment: (NOTE) The Xpert Xpress SARS-CoV-2/FLU/RSV plus assay is intended as an aid in the diagnosis of influenza from Nasopharyngeal swab specimens and should not be used as a  sole basis for treatment. Nasal washings and aspirates are unacceptable for Xpert Xpress SARS-CoV-2/FLU/RSV testing.  Fact Sheet for Patients: EntrepreneurPulse.com.au  Fact Sheet for Healthcare Providers: IncredibleEmployment.be  This test is not yet approved or cleared by the [place] FDA and has been authorized for detection and/or diagnosis of SARS-CoV-2 by FDA under an Emergency Use Authorization (EUA). This EUA will remain in effect (meaning this test can be used) for the duration of the COVID-19 declaration under Section 564(b)(1) of the Act, 21 U.S.C. section 360bbb-3(b)(1), unless the authorization is terminated or revoked.  Performed at [facility] [address] [place], [place] [address]   MRSA Next Gen by PCR, Nasal     Status: None   Collection Time: 04/22/21  1:50 PM   Specimen: Nasal Mucosa; Nasal Swab  Result Value Ref Range Status   MRSA by PCR Next Gen NOT DETECTED NOT DETECTED Final    Comment: (NOTE) The GeneXpert MRSA Assay (FDA approved for NASAL specimens only), is one component of a comprehensive MRSA colonization surveillance program. It is not intended to diagnose MRSA infection nor to guide or monitor treatment for MRSA infections. Test performance is not FDA approved in patients less than 17 years old. Performed at [facility], [address] [place], [place] [address]      Labs: BNP (last 3 results) No results for input(s): BNP in the last 8760 hours. Basic Metabolic Panel: Recent Labs  Lab 04/21/21 1030 04/22/21 0528 04/23/21 0404  NA 136 136  --   K 3.6 3.9  --   CL 102 102  --   CO2 25 26  --   GLUCOSE 110* 96  --   BUN 23 19  --   CREATININE 0.92 1.01  --   CALCIUM 8.9 8.0*  --   MG  --  2.0  --   PHOS  --  2.3* 3.6   Liver Function Tests: Recent Labs  Lab 04/21/21 1030 04/22/21 0528  AST 31 22  ALT 36 26  ALKPHOS 65 51  BILITOT 1.4* 1.1  PROT 7.1 6.0*   ALBUMIN 3.9 3.2*   No results for input(s): LIPASE, AMYLASE in the last 168 hours. No results for input(s): AMMONIA in the last 168 hours. CBC: Recent Labs  Lab 04/21/21 1030 04/22/21 0528  WBC 7.0 6.7  HGB 15.5 13.5  HCT 46.0 40.3  MCV 91.5 91.8  PLT 185 140*   Cardiac Enzymes: No results for input(s): CKTOTAL, CKMB, CKMBINDEX, TROPONINI in the last 168 hours. BNP: Invalid input(s): POCBNP CBG: No results for input(s): GLUCAP in the last  168 hours. D-Dimer No results for input(s): DDIMER in the last 72 hours. Hgb A1c No results for input(s): HGBA1C in the last 72 hours. Lipid Profile No results for input(s): CHOL, HDL, LDLCALC, TRIG, CHOLHDL, LDLDIRECT in the last 72 hours. Thyroid function studies No results for input(s): TSH, T4TOTAL, T3FREE, THYROIDAB in the last 72 hours.  Invalid input(s): FREET3 Anemia work up No results for input(s): VITAMINB12, FOLATE, FERRITIN, TIBC, IRON, RETICCTPCT in the last 72 hours. Urinalysis    Component Value Date/Time   COLORURINE YELLOW 04/21/2021 1607   APPEARANCEUR CLEAR (A) 04/21/2021 1607   LABSPEC 1.020 04/21/2021 1607   PHURINE 6.0 04/21/2021 1607   GLUCOSEU NEGATIVE 04/21/2021 1607   HGBUR NEGATIVE 04/21/2021 1607   BILIRUBINUR NEGATIVE 04/21/2021 1607   BILIRUBINUR negative 02/16/2021 0856   KETONESUR NEGATIVE 04/21/2021 1607   PROTEINUR NEGATIVE 04/21/2021 1607   UROBILINOGEN 0.2 02/16/2021 0856   NITRITE NEGATIVE 04/21/2021 1607   LEUKOCYTESUR NEGATIVE 04/21/2021 1607   Sepsis Labs Invalid input(s): PROCALCITONIN,  WBC,  LACTICIDVEN Microbiology Recent Results (from the past 240 hour(s))  Resp Panel by RT-PCR (Flu A&B, Covid)     Status: Abnormal   Collection Time: 04/21/21  2:58 PM  Result Value Ref Range Status   SARS Coronavirus 2 by RT PCR POSITIVE (A) NEGATIVE Final    Comment: (NOTE) SARS-CoV-2 target nucleic acids are DETECTED.  The SARS-CoV-2 RNA is generally detectable in upper  respiratory specimens during the acute phase of infection. Positive results are indicative of the presence of the identified virus, but do not rule out bacterial infection or co-infection with other pathogens not detected by the test. Clinical correlation with patient history and other diagnostic information is necessary to determine patient infection status. The expected result is Negative.  Fact Sheet for Patients: EntrepreneurPulse.com.au  Fact Sheet for Healthcare Providers: IncredibleEmployment.be  This test is not yet approved or cleared by the [place] FDA and  has been authorized for detection and/or diagnosis of SARS-CoV-2 by FDA under an Emergency Use Authorization (EUA).  This EUA will remain in effect (meaning this test can be used) for the duration of  the COVID-19 declaration under Section 564(b)(1) of the A ct, 21 U.S.C. section 360bbb-3(b)(1), unless the authorization is terminated or revoked sooner.     Influenza A by PCR NEGATIVE NEGATIVE Final   Influenza B by PCR NEGATIVE NEGATIVE Final    Comment: (NOTE) The Xpert Xpress SARS-CoV-2/FLU/RSV plus assay is intended as an aid in the diagnosis of influenza from Nasopharyngeal swab specimens and should not be used as a sole basis for treatment. Nasal washings and aspirates are unacceptable for Xpert Xpress SARS-CoV-2/FLU/RSV testing.  Fact Sheet for Patients: EntrepreneurPulse.com.au  Fact Sheet for Healthcare Providers: IncredibleEmployment.be  This test is not yet approved or cleared by the [place] FDA and has been authorized for detection and/or diagnosis of SARS-CoV-2 by FDA under an Emergency Use Authorization (EUA). This EUA will remain in effect (meaning this test can be used) for the duration of the COVID-19 declaration under Section 564(b)(1) of the Act, 21 U.S.C. section 360bbb-3(b)(1), unless the authorization is  terminated or revoked.  Performed at [facility], [address] [place], [place] [address]   MRSA Next Gen by PCR, Nasal     Status: None   Collection Time: 04/22/21  1:50 PM   Specimen: Nasal Mucosa; Nasal Swab  Result Value Ref Range Status   MRSA by PCR Next Gen NOT DETECTED NOT DETECTED Final  Comment: (NOTE) The GeneXpert MRSA Assay (FDA approved for NASAL specimens only), is one component of a comprehensive MRSA colonization surveillance program. It is not intended to diagnose MRSA infection nor to guide or monitor treatment for MRSA infections. Test performance is not FDA approved in patients less than 29 years old. Performed at [facility], [address] [place], [place] [address]     Time coordinating discharge: Over 30 minutes  SIGNED:  Lorella Nimrod, MD  [facility] 04/23/2021, 11:18 AM  If 7PM-7AM, please contact night-coverage [email or web address]  This record has been created using Systems analyst. Errors have been sought and corrected,but may not always be located. Such creation errors do not reflect on the standard of care.

## 2021-04-23 NOTE — TOC Progression Note (Signed)
Transition of Care [facility]) - Progression Note    Patient Details  Name: Joseph Osborn MRN: [id number] Date of Birth: 12/20/1925  [facility] of [facility]) CM/SW Sharon, RN Phone Number: 04/23/2021, 10:19 AM  Clinical Narrative: Discussed discharge HHPT with daughter, [name] who voices confident with assisting patient with ambulation and exercising without the need for HHPT. Understands to notify PCP for [facility] services if difficulty occurs. [facility] notified to cancel services. TOC barriers resolved.      Expected Discharge Plan: [place] Barriers to Discharge: Continued Medical Work up  Expected Discharge Plan and Services Expected Discharge Plan: [place] In-house Referral: Clinical Social Work   Post Acute Care Choice: [place] arrangements for the past 2 months: [place]: [place] ([place]) Date [place]: 04/22/21 Time [place]: [phone] Representative spoke with at [place]: [place] ([place]) Interventions    Readmission Risk Interventions No flowsheet data found.

## 2021-04-27 ENCOUNTER — Encounter: Payer: Self-pay | Admitting: Family Medicine

## 2021-04-27 ENCOUNTER — Other Ambulatory Visit: Payer: Self-pay

## 2021-04-27 ENCOUNTER — Ambulatory Visit (INDEPENDENT_AMBULATORY_CARE_PROVIDER_SITE_OTHER): Payer: Medicare Other | Admitting: Family Medicine

## 2021-04-27 VITALS — BP 132/68 | HR 77 | Temp 97.3°F | Ht 64.5 in | Wt 175.1 lb

## 2021-04-27 DIAGNOSIS — J9601 Acute respiratory failure with hypoxia: Secondary | ICD-10-CM | POA: Diagnosis not present

## 2021-04-27 DIAGNOSIS — I2510 Atherosclerotic heart disease of native coronary artery without angina pectoris: Secondary | ICD-10-CM | POA: Diagnosis not present

## 2021-04-27 DIAGNOSIS — J189 Pneumonia, unspecified organism: Secondary | ICD-10-CM

## 2021-04-27 DIAGNOSIS — N1831 Chronic kidney disease, stage 3a: Secondary | ICD-10-CM | POA: Diagnosis not present

## 2021-04-27 DIAGNOSIS — D696 Thrombocytopenia, unspecified: Secondary | ICD-10-CM | POA: Insufficient documentation

## 2021-04-27 DIAGNOSIS — U071 COVID-19: Secondary | ICD-10-CM

## 2021-04-27 NOTE — Assessment & Plan Note (Signed)
Update kidney function along with albumin, calcium and phosphorus which were all abnormal during hospitalization.

## 2021-04-27 NOTE — Assessment & Plan Note (Addendum)
Post COVID community acquired pneumonia treated with IV rocephin/azithromycin then sent home to complete 5d Omnicef course. Seems to be recovering well.

## 2021-04-27 NOTE — Assessment & Plan Note (Addendum)
Has largely recovered from recent COVID infection complicated by community acquired pneumonia.

## 2021-04-27 NOTE — Progress Notes (Signed)
Patient ID: Joseph Osborn, male    DOB: 11/21/1925, 87 y.o.   MRN: [id number]  This visit was conducted in person.  BP 132/68    Pulse 77    Temp (!) 97.3 F (36.3 C) (Temporal)    Ht 5' 4.5" (1.638 m)    Wt 175 lb 1 oz (79.4 kg)    SpO2 95%    BMI 29.59 kg/m    CC: hosp f/u visit  Subjective:   HPI: Joseph Osborn is a 87 y.o. male presenting on 04/27/2021 for Hospitalization Follow-up (Admitted on 04/21/21 at [facility], dx acute respiratory failure;  community acquired pneumonia.  Accompanied by daughter, [name]. )   Recent hospitalization for acute respiratory failure due to pneumonia as sequelae of COVID infection despte taking molnupiravir and prednisone course. Treated for CAP with ceftriaxone and azithromycin, discharged on outpatient omnicef course with full resolution of symptoms.  Hospital records reviewed. Med rec performed.   Since home, continues feeling better but notes ongoing significant mucous production.  Cough has improved, no dyspnea.   Home health offered, family declined. He has been staying active at home, also goes to the mall for regular walking.  Other follow up appointments scheduled: none ___________________________________________________________________ Hospital admission: 04/21/2021 Hospital discharge: 04/23/2021 TCM f/u phone call: not performed   Admitted From: Home Disposition: Home   Recommendations for Outpatient Follow-up:  Follow up with PCP in 1-2 weeks Please obtain BMP/CBC in one week Please follow up on the following pending results: None   Discharge Diagnoses:  Principal Problem:   Community acquired pneumonia Active Problems:   Benign prostatic hyperplasia   CAD (coronary artery disease)   CKD (chronic kidney disease), stage III (HCC)   COPD (chronic obstructive pulmonary disease) ([place])   DDD (degenerative disc disease), cervical   Borderline hypothyroidism   Osteoarthritis   Bilateral hearing loss   Acute  respiratory failure with hypoxia ([place])  Home Health: No Equipment/Devices: Rolling walker Discharge Condition: Stable CODE STATUS: Full Diet recommendation: Heart Healthy     Relevant past medical, surgical, family and social history reviewed and updated as indicated. Interim medical history since our last visit reviewed. Allergies and medications reviewed and updated. Outpatient Medications Prior to Visit  Medication Sig Dispense Refill   Acetaminophen (TYLENOL PO) Take by mouth at bedtime. As needed     ascorbic acid (VITAMIN C) 500 MG tablet Take 1 tablet (500 mg total) by mouth daily. 90 tablet 1   atenolol (TENORMIN) 50 MG tablet Take 1 tablet (50 mg total) by mouth daily. 90 tablet 3   cefdinir (OMNICEF) 300 MG capsule Take 1 capsule (300 mg total) by mouth 2 (two) times daily for 5 days. 10 capsule 0   linaclotide (LINZESS) 72 MCG capsule Take 1 capsule (72 mcg total) by mouth every other day. 45 capsule 1   ondansetron (ZOFRAN) 4 MG tablet Take 1 tablet (4 mg total) by mouth every 8 (eight) hours as needed for nausea or vomiting. 20 tablet 0   tamsulosin (FLOMAX) 0.4 MG CAPS capsule Take 1 capsule (0.4 mg total) by mouth daily. 90 capsule 3   zinc sulfate 220 (50 Zn) MG capsule Take 1 capsule (220 mg total) by mouth daily. 90 capsule 1   No facility-administered medications prior to visit.     Per HPI unless specifically indicated in ROS section below Review of Systems  Objective:  BP 132/68    Pulse 77    Temp (!) 97.3 F (36.3  C) (Temporal)    Ht 5' 4.5" (1.638 m)    Wt 175 lb 1 oz (79.4 kg)    SpO2 95%    BMI 29.59 kg/m   Wt Readings from Last 3 Encounters:  04/27/21 175 lb 1 oz (79.4 kg)  02/16/21 177 lb 1 oz (80.3 kg)  11/27/20 176 lb 6 oz (80 kg)      Physical Exam Vitals and nursing note reviewed.  Constitutional:      Appearance: Normal appearance. He is not ill-appearing.  HENT:     Head: Normocephalic and atraumatic.     Right Ear: Tympanic membrane, ear  canal and external ear normal.     Left Ear: Ear canal and external ear normal. There is impacted cerumen.     Ears:     Comments: Cerumen impaction on left s/p partial clearing with plastic curette     Mouth/Throat:     Mouth: Mucous membranes are moist.     Pharynx: Oropharynx is clear. No oropharyngeal exudate or posterior oropharyngeal erythema.  Eyes:     Extraocular Movements: Extraocular movements intact.     Pupils: Pupils are equal, round, and reactive to light.  Cardiovascular:     Rate and Rhythm: Normal rate and regular rhythm.     Pulses: Normal pulses.     Heart sounds: Normal heart sounds. No murmur heard. Pulmonary:     Effort: Pulmonary effort is normal. No respiratory distress.     Breath sounds: No wheezing, rhonchi or rales.     Comments: Slight crackles LLL Musculoskeletal:     Right lower leg: No edema.     Left lower leg: No edema.  Skin:    General: Skin is warm and dry.     Findings: No rash.  Neurological:     Mental Status: He is alert.  Psychiatric:        Mood and Affect: Mood normal.        Behavior: Behavior normal.      Results for orders placed or performed during the hospital encounter of 04/21/21  Resp Panel by RT-PCR (Flu A&B, Covid)  Result Value Ref Range   SARS Coronavirus 2 by RT PCR POSITIVE (A) NEGATIVE   Influenza A by PCR NEGATIVE NEGATIVE   Influenza B by PCR NEGATIVE NEGATIVE  MRSA Next Gen by PCR, Nasal   Specimen: Nasal Mucosa; Nasal Swab  Result Value Ref Range   MRSA by PCR Next Gen NOT DETECTED NOT DETECTED  Basic metabolic panel  Result Value Ref Range   Sodium 136 135 - 145 mmol/L   Potassium 3.6 3.5 - 5.1 mmol/L   Chloride 102 98 - 111 mmol/L   CO2 25 22 - 32 mmol/L   Glucose, Bld 110 (H) 70 - 99 mg/dL   BUN 23 8 - 23 mg/dL   Creatinine, Ser 0.92 0.61 - 1.24 mg/dL   Calcium 8.9 8.9 - 10.3 mg/dL   GFR, Estimated >60 >60 mL/min   Anion gap 9 5 - 15  CBC  Result Value Ref Range   WBC 7.0 4.0 - 10.5 K/uL   RBC  5.03 4.22 - 5.81 MIL/uL   Hemoglobin 15.5 13.0 - 17.0 g/dL   HCT 46.0 39.0 - 52.0 %   MCV 91.5 80.0 - 100.0 fL   MCH 30.8 26.0 - 34.0 pg   MCHC 33.7 30.0 - 36.0 g/dL   RDW 13.2 11.5 - 15.5 %   Platelets 185 150 - 400 K/uL  nRBC 0.0 0.0 - 0.2 %  Urinalysis, Routine w reflex microscopic Urine, Clean Catch  Result Value Ref Range   Color, Urine YELLOW YELLOW   APPearance CLEAR (A) CLEAR   Specific Gravity, Urine 1.020 1.005 - 1.030   pH 6.0 5.0 - 8.0   Glucose, UA NEGATIVE NEGATIVE mg/dL   Hgb urine dipstick NEGATIVE NEGATIVE   Bilirubin Urine NEGATIVE NEGATIVE   Ketones, ur NEGATIVE NEGATIVE mg/dL   Protein, ur NEGATIVE NEGATIVE mg/dL   Nitrite NEGATIVE NEGATIVE   Leukocytes,Ua NEGATIVE NEGATIVE   RBC / HPF 0-5 0 - 5 RBC/hpf   WBC, UA 0-5 0 - 5 WBC/hpf   Bacteria, UA RARE (A) NONE SEEN   Squamous Epithelial / LPF 0-5 0 - 5   Mucus PRESENT   Hepatic function panel  Result Value Ref Range   Total Protein 7.1 6.5 - 8.1 g/dL   Albumin 3.9 3.5 - 5.0 g/dL   AST 31 15 - 41 U/L   ALT 36 0 - 44 U/L   Alkaline Phosphatase 65 38 - 126 U/L   Total Bilirubin 1.4 (H) 0.3 - 1.2 mg/dL   Bilirubin, Direct 0.2 0.0 - 0.2 mg/dL   Indirect Bilirubin 1.2 (H) 0.3 - 0.9 mg/dL  Lactic acid, plasma  Result Value Ref Range   Lactic Acid, Venous 1.4 0.5 - 1.9 mmol/L  Lactic acid, plasma  Result Value Ref Range   Lactic Acid, Venous 1.3 0.5 - 1.9 mmol/L  HIV Antibody (routine testing w rflx)  Result Value Ref Range   HIV Screen 4th Generation wRfx Non Reactive Non Reactive  Legionella Pneumophila Serogp 1 Ur Ag  Result Value Ref Range   L. pneumophila Serogp 1 Ur Ag Negative Negative   Source of Sample URINE, RANDOM   Strep pneumoniae urinary antigen  Result Value Ref Range   Strep Pneumo Urinary Antigen NEGATIVE NEGATIVE  Procalcitonin - Baseline  Result Value Ref Range   Procalcitonin 0.14 ng/mL  CBC  Result Value Ref Range   WBC 6.7 4.0 - 10.5 K/uL   RBC 4.39 4.22 - 5.81 MIL/uL    Hemoglobin 13.5 13.0 - 17.0 g/dL   HCT 40.3 39.0 - 52.0 %   MCV 91.8 80.0 - 100.0 fL   MCH 30.8 26.0 - 34.0 pg   MCHC 33.5 30.0 - 36.0 g/dL   RDW 13.2 11.5 - 15.5 %   Platelets 140 (L) 150 - 400 K/uL   nRBC 0.0 0.0 - 0.2 %  Comprehensive metabolic panel  Result Value Ref Range   Sodium 136 135 - 145 mmol/L   Potassium 3.9 3.5 - 5.1 mmol/L   Chloride 102 98 - 111 mmol/L   CO2 26 22 - 32 mmol/L   Glucose, Bld 96 70 - 99 mg/dL   BUN 19 8 - 23 mg/dL   Creatinine, Ser 1.01 0.61 - 1.24 mg/dL   Calcium 8.0 (L) 8.9 - 10.3 mg/dL   Total Protein 6.0 (L) 6.5 - 8.1 g/dL   Albumin 3.2 (L) 3.5 - 5.0 g/dL   AST 22 15 - 41 U/L   ALT 26 0 - 44 U/L   Alkaline Phosphatase 51 38 - 126 U/L   Total Bilirubin 1.1 0.3 - 1.2 mg/dL   GFR, Estimated >60 >60 mL/min   Anion gap 8 5 - 15  Magnesium  Result Value Ref Range   Magnesium 2.0 1.7 - 2.4 mg/dL  Phosphorus  Result Value Ref Range   Phosphorus 2.3 (L) 2.5 - 4.6  mg/dL  Phosphorus  Result Value Ref Range   Phosphorus 3.6 2.5 - 4.6 mg/dL  Troponin I (High Sensitivity)  Result Value Ref Range   Troponin I (High Sensitivity) 14 <18 ng/L  Troponin I (High Sensitivity)  Result Value Ref Range   Troponin I (High Sensitivity) 14 <18 ng/L    Assessment & Plan:  This visit occurred during the SARS-CoV-2 public health emergency.  Safety protocols were in place, including screening questions prior to the visit, additional usage of staff PPE, and extensive cleaning of exam room while observing appropriate contact time as indicated for disinfecting solutions.   Problem List Items Addressed This Visit     CKD (chronic kidney disease), stage III ([place])    Update kidney function along with albumin, calcium and phosphorus which were all abnormal during hospitalization.       COVID-19    Has largely recovered from recent COVID infection complicated by community acquired pneumonia.       Community acquired pneumonia - Primary    Post COVID community  acquired pneumonia treated with IV rocephin/azithromycin then sent home to complete 5d Omnicef course. Seems to be recovering well.       Relevant Orders   Comprehensive metabolic panel   CBC with Differential/Platelet   Phosphorus   Acute respiratory failure with hypoxia (HCC)    Resolved, currently on room air.       Thrombocytopenia (HCC)    Mild, noted on recent hospitalization. Update CBC today.         No orders of the defined types were placed in this encounter.  Orders Placed This Encounter  Procedures   Comprehensive metabolic panel   CBC with Differential/Platelet   Phosphorus    Patient Instructions  Labs today  Puede tratar irrigacion de salina nasal  Puede tratar guaifenesina de nuevo, o antihistaminico como allegra o claritina para sintomas de moco/congestion.  Gusto verlos hoy. Dejenos saber si no mejora como esperado.   Follow up plan: Return if symptoms worsen or fail to improve.  Ria Bush, MD

## 2021-04-27 NOTE — Assessment & Plan Note (Signed)
Resolved, currently on room air.

## 2021-04-27 NOTE — Assessment & Plan Note (Signed)
Mild, noted on recent hospitalization. Update CBC today.

## 2021-04-27 NOTE — Patient Instructions (Addendum)
Labs today  Puede tratar irrigacion de salina nasal  Puede tratar guaifenesina de nuevo, o antihistaminico como allegra o claritina para sintomas de moco/congestion.  Gusto verlos hoy. Dejenos saber si no mejora como esperado.

## 2021-04-28 LAB — CBC WITH DIFFERENTIAL/PLATELET
Basophils Absolute: 0 10*3/uL (ref 0.0–0.1)
Basophils Relative: 0.6 % (ref 0.0–3.0)
Eosinophils Absolute: 0.2 10*3/uL (ref 0.0–0.7)
Eosinophils Relative: 3.6 % (ref 0.0–5.0)
HCT: 42.5 % (ref 39.0–52.0)
Hemoglobin: 14.3 g/dL (ref 13.0–17.0)
Lymphocytes Relative: 27.5 % (ref 12.0–46.0)
Lymphs Abs: 1.8 10*3/uL (ref 0.7–4.0)
MCHC: 33.6 g/dL (ref 30.0–36.0)
MCV: 93.6 fl (ref 78.0–100.0)
Monocytes Absolute: 0.6 10*3/uL (ref 0.1–1.0)
Monocytes Relative: 9.1 % (ref 3.0–12.0)
Neutro Abs: 4 10*3/uL (ref 1.4–7.7)
Neutrophils Relative %: 59.2 % (ref 43.0–77.0)
Platelets: 227 10*3/uL (ref 150.0–400.0)
RBC: 4.54 Mil/uL (ref 4.22–5.81)
RDW: 13.6 % (ref 11.5–15.5)
WBC: 6.7 10*3/uL (ref 4.0–10.5)

## 2021-04-28 LAB — COMPREHENSIVE METABOLIC PANEL
ALT: 51 U/L (ref 0–53)
AST: 49 U/L — ABNORMAL HIGH (ref 0–37)
Albumin: 3.9 g/dL (ref 3.5–5.2)
Alkaline Phosphatase: 81 U/L (ref 39–117)
BUN: 20 mg/dL (ref 6–23)
CO2: 28 mEq/L (ref 19–32)
Calcium: 9.4 mg/dL (ref 8.4–10.5)
Chloride: 104 mEq/L (ref 96–112)
Creatinine, Ser: 1.07 mg/dL (ref 0.40–1.50)
GFR: 59.69 mL/min — ABNORMAL LOW (ref >60.00)
Glucose, Bld: 81 mg/dL (ref 70–99)
Potassium: 5.1 mEq/L (ref 3.5–5.1)
Sodium: 138 mEq/L (ref 135–145)
Total Bilirubin: 0.6 mg/dL (ref 0.2–1.2)
Total Protein: 7.3 g/dL (ref 6.0–8.3)

## 2021-04-28 LAB — PHOSPHORUS: Phosphorus: 3 mg/dL (ref 2.3–4.6)

## 2021-05-24 ENCOUNTER — Telehealth: Payer: Self-pay | Admitting: *Deleted

## 2021-05-24 NOTE — Telephone Encounter (Signed)
Transition Care Management Unsuccessful Follow-up Telephone Call  Date of discharge and from where:   04/23/21 [facility]  Attempts:  1st Attempt  Reason for unsuccessful TCM follow-up call:  Left voice message using [place] ID # [id number]   Kelli Churn RN, CCM, [facility] Care Management Coordinator - Managed [place] High Risk [phone]

## 2021-05-26 ENCOUNTER — Telehealth: Payer: Self-pay

## 2021-05-26 NOTE — Telephone Encounter (Signed)
Transition Care Management Unsuccessful Follow-up Telephone Call  Date of discharge and from where:  04/23/2021  [facility]  Attempts:  2nd Attempt  Reason for unsuccessful TCM follow-up call:  No answer/busy Tomasa Rand, RN, BSN, CEN [place] Coordinator [phone]

## 2021-06-09 ENCOUNTER — Other Ambulatory Visit: Payer: Self-pay

## 2021-06-09 ENCOUNTER — Ambulatory Visit (INDEPENDENT_AMBULATORY_CARE_PROVIDER_SITE_OTHER): Payer: Medicare Other | Admitting: Dermatology

## 2021-06-09 DIAGNOSIS — L578 Other skin changes due to chronic exposure to nonionizing radiation: Secondary | ICD-10-CM | POA: Diagnosis not present

## 2021-06-09 DIAGNOSIS — D229 Melanocytic nevi, unspecified: Secondary | ICD-10-CM

## 2021-06-09 DIAGNOSIS — Z85828 Personal history of other malignant neoplasm of skin: Secondary | ICD-10-CM | POA: Diagnosis not present

## 2021-06-09 DIAGNOSIS — Z1283 Encounter for screening for malignant neoplasm of skin: Secondary | ICD-10-CM

## 2021-06-09 DIAGNOSIS — L821 Other seborrheic keratosis: Secondary | ICD-10-CM

## 2021-06-09 DIAGNOSIS — L820 Inflamed seborrheic keratosis: Secondary | ICD-10-CM

## 2021-06-09 DIAGNOSIS — D1800 Hemangioma unspecified site: Secondary | ICD-10-CM

## 2021-06-09 DIAGNOSIS — L814 Other melanin hyperpigmentation: Secondary | ICD-10-CM

## 2021-06-09 NOTE — Patient Instructions (Signed)

## 2021-06-09 NOTE — Progress Notes (Signed)
Follow-Up Visit   Subjective  Joseph Osborn is a 88 y.o. male who presents for the following: hx of BCC (L groin, excised 03/23/2021, underwear irritate scar) and Total body skin exam (Hx of BCC). The patient presents for Total-Body Skin Exam (TBSE) for skin cancer screening and mole check.  The patient has spots, moles and lesions to be evaluated, some may be new or changing and the patient has concerns that these could be cancer.  Patient accompanied by son in law who contributes to history.  The following portions of the chart were reviewed this encounter and updated as appropriate:   Tobacco   Allergies   Meds   Problems   Med Hx   Surg Hx   Fam Hx      Review of Systems:  No other skin or systemic complaints except as noted in HPI or Assessment and Plan.  Objective  Well appearing patient in no apparent distress; mood and affect are within normal limits.  A full examination was performed including scalp, head, eyes, ears, nose, lips, neck, chest, axillae, abdomen, back, buttocks, bilateral upper extremities, bilateral lower extremities, hands, feet, fingers, toes, fingernails, and toenails. All findings within normal limits unless otherwise noted below.  L groin Well healed scar with no evidence of recurrence.   L groin x 1 Stuck on waxy paps with erythema    Assessment & Plan   Lentigines - Scattered tan macules - Due to sun exposure - Benign-appearing, observe - Recommend daily broad spectrum sunscreen SPF 30+ to sun-exposed areas, reapply every 2 hours as needed. - Call for any changes  Seborrheic Keratoses - Stuck-on, waxy, tan-brown papules and/or plaques  - Benign-appearing - Discussed benign etiology and prognosis. - Observe - Call for any changes - trunk, scalp,legs  Melanocytic Nevi - Tan-brown and/or pink-flesh-colored symmetric macules and papules - Benign appearing on exam today - Observation - Call clinic for new or changing moles -  Recommend daily use of broad spectrum spf 30+ sunscreen to sun-exposed areas.  - trunk  Hemangiomas - Red papules - Discussed benign nature - Observe - Call for any changes - trunk  Actinic Damage - Chronic condition, secondary to cumulative UV/sun exposure - diffuse scaly erythematous macules with underlying dyspigmentation - Recommend daily broad spectrum sunscreen SPF 30+ to sun-exposed areas, reapply every 2 hours as needed.  - Staying in the shade or wearing long sleeves, sun glasses (UVA+UVB protection) and wide brim hats (4-inch brim around the entire circumference of the hat) are also recommended for sun protection.  - Call for new or changing lesions.  Skin cancer screening performed today.   History of basal cell carcinoma (BCC) L groin  Clear. Observe for recurrence. Call clinic for new or changing lesions.  Recommend regular skin exams, daily broad-spectrum spf 30+ sunscreen use, and photoprotection.    Inflamed seborrheic keratosis L groin x 1  Destruction of lesion - L groin x 1 Complexity: simple   Destruction method: cryotherapy   Informed consent: discussed and consent obtained   Timeout:  patient name, date of birth, surgical site, and procedure verified Lesion destroyed using liquid nitrogen: Yes   Region frozen until ice ball extended beyond lesion: Yes   Outcome: patient tolerated procedure well with no complications   Post-procedure details: wound care instructions given     Return in about 1 year (around 06/09/2022) for TBSE, Hx of BCC.  I, Othelia Pulling, RMA, am acting as scribe for Sarina Ser, MD .  Documentation: I have reviewed the above documentation for accuracy and completeness, and I agree with the above.  Sarina Ser, MD

## 2021-06-12 ENCOUNTER — Encounter: Payer: Self-pay | Admitting: Dermatology

## 2021-07-14 NOTE — Progress Notes (Signed)
Subjective:   Joseph Osborn is a 88 y.o. male who presents for Medicare Annual/Subsequent preventive examination.  I connected with Joseph Osborn today by telephone and verified that I am speaking with the correct person using two identifiers. Location patient: home Location provider: work Persons participating in the virtual visit: patient, [name].    I discussed the limitations, risks, security and privacy concerns of performing an evaluation and management service by telephone and the availability of in person appointments. I also discussed with the patient that there may be a patient responsible charge related to this service. The patient expressed understanding and verbally consented to this telephonic visit.    Interactive audio and video telecommunications were attempted between this provider and patient, however failed, due to patient having technical difficulties OR patient did not have access to video capability.  We continued and completed visit with audio only.  Some vital signs may be absent or patient reported.   Time Spent with patient on telephone encounter: 20 minutes  Review of Systems     Cardiac Risk Factors include: advanced age (>56mn, >>69women)     Objective:    Today's Vitals   07/15/21 1159  Weight: 175 lb (79.4 kg)  Height: '5\' 4"'$  (1.626 m)   Body mass index is 30.04 kg/m.  Advanced Directives 07/15/2021 04/21/2021 07/14/2020 03/03/2016  Does Patient Have a Medical Advance Directive? No No No No  Would patient like information on creating a medical advance directive? Yes (MAU/Ambulatory/Procedural Areas - Information given) No - Patient declined No - Patient declined No - patient declined information    Current Medications (verified) Outpatient Encounter Medications as of 07/15/2021  Medication Sig   Acetaminophen (TYLENOL PO) Take by mouth at bedtime. As needed   atenolol (TENORMIN) 50 MG tablet Take 1 tablet (50 mg total) by  mouth daily.   tamsulosin (FLOMAX) 0.4 MG CAPS capsule Take 1 capsule (0.4 mg total) by mouth daily.   ascorbic acid (VITAMIN C) 500 MG tablet Take 1 tablet (500 mg total) by mouth daily. (Patient not taking: Reported on 07/15/2021)   linaclotide (LINZESS) 72 MCG capsule Take 1 capsule (72 mcg total) by mouth every other day. (Patient not taking: Reported on 07/15/2021)   ondansetron (ZOFRAN) 4 MG tablet Take 1 tablet (4 mg total) by mouth every 8 (eight) hours as needed for nausea or vomiting. (Patient not taking: Reported on 07/15/2021)   zinc sulfate 220 (50 Zn) MG capsule Take 1 capsule (220 mg total) by mouth daily. (Patient not taking: Reported on 07/15/2021)   No facility-administered encounter medications on file as of 07/15/2021.    Allergies (verified) Shellfish allergy and Oxybutynin   History: Past Medical History:  Diagnosis Date   Arthritis    Basal cell carcinoma 02/04/2021   L groin, excised 03/23/21   BPH (benign prostatic hyperplasia)    CAD (coronary artery disease)    Chronic kidney disease    Stage III   COPD (chronic obstructive pulmonary disease) (HCC)    DDD (degenerative disc disease), cervical    Hypertension    Lipoma 2017   Lower back pain    Myocardial infarction ([facility]qui    approx 2000   Thyroid disease    Hypothyroidism   Wears dentures    partial upper   Past Surgical History:  Procedure Laterality Date   BLADDER SURGERY  03/2008   bladder polyp removed ([facility]   CARDIAC CATHETERIZATION  2000   1 stent placed after  MI   CATARACT EXTRACTION W/ INTRAOCULAR LENS  IMPLANT, BILATERAL  2014   [facility], Dr. George Ina   CHOLECYSTECTOMY     In [place]   COLONOSCOPY  03/13/2014   [facility], Dr. Vira Agar   COLONOSCOPY WITH PROPOFOL N/A 03/03/2016   Procedure: COLONOSCOPY WITH PROPOFOL;  Surgeon: Lucilla Lame, MD;  Location: [place];  Service: Endoscopy;  Laterality: N/A;   ESOPHAGOGASTRODUODENOSCOPY (EGD) WITH PROPOFOL N/A 03/03/2016    Procedure: ESOPHAGOGASTRODUODENOSCOPY (EGD) WITH PROPOFOL;  Surgeon: Lucilla Lame, MD;  Location: [place];  Service: Endoscopy;  Laterality: N/A;  Interpreter needed   HEMORRHOID SURGERY     [place], [place] RESECTION OF PROSTATE  03/24/2008   Dr Eliberto Ivory, [facility]   Family History  Problem Relation Age of Onset   Heart disease Mother    Heart attack Mother    Liver cancer Cousin    Diabetes Neg Hx    Social History   Socioeconomic History   Marital status: Married    Spouse name: Not on file   Number of children: Not on file   Years of education: Not on file   Highest education level: Not on file  Occupational History   Not on file  Tobacco Use   Smoking status: Never   Smokeless tobacco: Never  Substance and Sexual Activity   Alcohol use: Not Currently    Comment: very rare(Holidays)   Drug use: No   Sexual activity: Not on file  Other Topics Concern   Not on file  Social History Narrative   Lives with wife, daughter Joseph Osborn) and her husband and son   From [place], [place]   Occ: retired, previously worked Armed forces logistics/support/administrative officer    Social Determinants of Radio broadcast assistant Strain: Low Risk    Difficulty of Paying Living Expenses: Not hard at all  Food Insecurity: No Food Insecurity   Worried About Charity fundraiser in the Last Year: Never true   Arboriculturist in the Last Year: Never true  Transportation Needs: No Transportation Needs   Lack of Transportation (Medical): No   Lack of Transportation (Non-Medical): No  Physical Activity: Insufficiently Active   Days of Exercise per Week: 4 days   Minutes of Exercise per Session: 30 min  Stress: No Stress Concern Present   Feeling of Stress : Only a little  Social Connections: Moderately Isolated   Frequency of Communication with Friends and Family: More than three times a week   Frequency of Social Gatherings with Friends and Family: More than three times a week   Attends Religious  Services: Never   Marine scientist or Organizations: No   Attends Music therapist: Never   Marital Status: Married    Tobacco Counseling Counseling given: Not Answered   Clinical Intake:  Pre-visit preparation completed: Yes  Pain : No/denies pain     BMI - recorded: 30.04 Nutritional Status: BMI > 30  Obese Nutritional Risks: None Diabetes: No  How often do you need to have someone help you when you read instructions, pamphlets, or other written materials from your doctor or pharmacy?: 1 - Never  Diabetic? No  Interpreter Needed?: Yes Interpreter Name: Daughter Patient Declined Interpreter : No Patient signed [place] waiver: No  Information entered by :: Orrin Brigham LPN   Activities of Daily Living In your present state of health, do you have any difficulty performing the following activities: 07/15/2021 04/21/2021  Hearing?  Y N  Vision? N N  Difficulty concentrating or making decisions? N N  Walking or climbing stairs? Y Y  Dressing or bathing? N N  Doing errands, shopping? Tempie Donning  Comment daughter assist -  Conservation officer, nature and eating ? Y -  Comment daughter or wife assist -  Using the Toilet? N -  In the past six months, have you accidently leaked urine? N -  Do you have problems with loss of bowel control? N -  Managing your Medications? Y -  Comment daughter assist -  Managing your Finances? Y -  Comment daughter assist -  Housekeeping or managing your Housekeeping? Y -  Comment daughter and wife assist -  Some recent data might be hidden    Patient Care Team: Ria Bush, MD as PCP - General (Family Medicine)  Indicate any recent Medical Services you may have received from other than [facility] providers in the past year (date may be approximate).     Assessment:   This is a routine wellness examination for Joseph Osborn.  Hearing/Vision screen Hearing Screening - Comments:: Decrease in hearing  Vision Screening - Comments:: Last  exam 01/2021, [facility] , Dr. Linton Flemings  Dietary issues and exercise activities discussed: Current Exercise Habits: Home exercise routine, Type of exercise: walking, Time (Minutes): 30, Frequency (Times/Week): 4, Weekly Exercise (Minutes/Week): 120, Intensity: Moderate   Goals Addressed             This Visit's Progress    Patient Stated       Would like to maintain current routine       Depression Screen PHQ 2/9 Scores 07/15/2021 07/14/2020 03/18/2020  PHQ - 2 Score '1 2 1  '$ PHQ- 9 Score - 2 9    Fall Risk Fall Risk  07/15/2021 07/14/2020  Falls in the past year? 0 0  Number falls in past yr: 0 0  Injury with Fall? 0 0  Risk for fall due to : Other (Comment) Medication side effect  Risk for fall due to: Comment lost balance -  Follow up Falls prevention discussed Falls evaluation completed;Falls prevention discussed    FALL RISK PREVENTION PERTAINING TO THE HOME:  Any stairs in or around the home? Yes  If so, are there any without handrails? No  Home free of loose throw rugs in walkways, pet beds, electrical cords, etc? Yes  Adequate lighting in your home to reduce risk of falls? Yes   ASSISTIVE DEVICES UTILIZED TO PREVENT FALLS:  Life alert? No  Use of a cane, walker or w/c? Yes , walker  Grab bars in the bathroom? No  Shower chair or bench in shower? Yes  Elevated toilet seat or a handicapped toilet? No   TIMED UP AND GO:  Was the test performed? No .    Cognitive Function: Normal cognitive status assessed by this Nurse Health Advisor. No abnormalities found.   MMSE - Mini Mental State Exam 07/14/2020  Orientation to time 5  Orientation to Place 5  Registration 3  Attention/ Calculation 5  Recall 1  Language- repeat 1        Immunizations Immunization History  Administered Date(s) Administered   Fluad Quad(high Dose 65+) 02/16/2021   Influenza, High Dose Seasonal PF 03/14/2020   Moderna SARS-COV2 Booster Vaccination 08/23/2019, 09/20/2019   Moderna  Sars-Covid-2 Vaccination 05/20/2020    TDAP status: Due, Education has been provided regarding the importance of this vaccine. Advised may receive this vaccine at local pharmacy or Health Dept.  Aware to provide a copy of the vaccination record if obtained from local pharmacy or Health Dept. Verbalized acceptance and understanding.  Flu Vaccine status: Up to date  Pneumococcal vaccine status: Due, Education has been provided regarding the importance of this vaccine. Advised may receive this vaccine at local pharmacy or Health Dept. Aware to provide a copy of the vaccination record if obtained from local pharmacy or Health Dept. Verbalized acceptance and understanding.  Covid-19 vaccine status: Information provided on how to obtain vaccines.   Qualifies for Shingles Vaccine? Yes   Zostavax completed No   Shingrix Completed?: No.    Education has been provided regarding the importance of this vaccine. Patient has been advised to call insurance company to determine out of pocket expense if they have not yet received this vaccine. Advised may also receive vaccine at local pharmacy or Health Dept. Verbalized acceptance and understanding.  Screening Tests Health Maintenance  Topic Date Due   Zoster Vaccines- Shingrix (1 of 2) Never done   Pneumonia Vaccine 57+ Years old (1 - PCV) Never done   COVID-19 Vaccine (2 - Moderna risk series) 06/17/2020   TETANUS/TDAP  07/15/2023 (Originally 07/05/1944)   INFLUENZA VACCINE  Completed   HPV VACCINES  Aged Out    Health Maintenance  Health Maintenance Due  Topic Date Due   Zoster Vaccines- Shingrix (1 of 2) Never done   Pneumonia Vaccine 37+ Years old (1 - PCV) Never done   COVID-19 Vaccine (2 - Moderna risk series) 06/17/2020    Colorectal cancer screening: No longer required.   Lung Cancer Screening: (Low Dose CT Chest recommended if Age 35-80 years, 30 pack-year currently smoking OR have quit w/in 15years.) does not qualify.     Additional  Screening:  Hepatitis C Screening: does not qualify  Vision Screening: Recommended annual ophthalmology exams for early detection of glaucoma and other disorders of the eye. Is the patient up to date with their annual eye exam?  Yes  Who is the provider or what is the name of the office in which the patient attends annual eye exams? Dr. Linton Flemings   Dental Screening: Recommended annual dental exams for proper oral hygiene  Community Resource Referral / Chronic Care Management: CRR required this visit?  No   CCM required this visit?  No      Plan:     I have personally reviewed and noted the following in the patients chart:   Medical and social history Use of alcohol, tobacco or illicit drugs  Current medications and supplements including opioid prescriptions. Patient is not currently taking opioid prescriptions. Functional ability and status Nutritional status Physical activity Advanced directives List of other physicians Hospitalizations, surgeries, and ER visits in previous 12 months Vitals Screenings to include cognitive, depression, and falls Referrals and appointments  In addition, I have reviewed and discussed with patient certain preventive protocols, quality metrics, and best practice recommendations. A written personalized care plan for preventive services as well as general preventive health recommendations were provided to patient.   Due to this being a telephonic visit, the after visit summary with patients personalized plan was offered to patient via mail or my-chart. Patient preferred to pick up at office at next visit.    Loma Messing, LPN   08/14/5460   Nurse Health Advisor  Nurse Notes: none

## 2021-07-15 ENCOUNTER — Ambulatory Visit (INDEPENDENT_AMBULATORY_CARE_PROVIDER_SITE_OTHER): Payer: Medicare Other

## 2021-07-15 ENCOUNTER — Other Ambulatory Visit: Payer: Self-pay | Admitting: Family Medicine

## 2021-07-15 VITALS — Ht 64.0 in | Wt 175.0 lb

## 2021-07-15 DIAGNOSIS — E559 Vitamin D deficiency, unspecified: Secondary | ICD-10-CM

## 2021-07-15 DIAGNOSIS — Z0000 Encounter for general adult medical examination without abnormal findings: Secondary | ICD-10-CM

## 2021-07-15 DIAGNOSIS — N1831 Chronic kidney disease, stage 3a: Secondary | ICD-10-CM

## 2021-07-15 DIAGNOSIS — E039 Hypothyroidism, unspecified: Secondary | ICD-10-CM

## 2021-07-15 NOTE — Patient Instructions (Addendum)
[name] , Thank you for taking time to complete your Medicare Wellness Visit. I appreciate your ongoing commitment to your health goals. Please review the following plan we discussed and let me know if I can assist you in the future.   Screening recommendations/referrals: Colonoscopy: no longer required  Ophthalmology/optometry visit: up to date  Recommended yearly dental visit for hygiene and checkup  Vaccinations: Influenza vaccine: up to date Pneumococcal vaccine: due, per our conversation you plan to discuss with PCP at your next appointment  Tdap vaccine: due, per our conversation you plan to discuss with PCP at your next appointment  Shingles vaccine: due, per our conversation you plan to discuss with PCP at your next appointment    Covid-19: due, per our conversation you plan to discuss with PCP at your next appointment   Advanced directives: Please bring a copy of Living Will and/or [place] for your chart, when available    Conditions/risks identified: see problem list   Next appointment: Follow up in one year for your annual wellness visit.   Preventive Care 45 Years and Older, Male Preventive care refers to lifestyle choices and visits with your health care provider that can promote health and wellness. What does preventive care include? A yearly physical exam. This is also called an annual well check. Dental exams once or twice a year. Routine eye exams. Ask your health care provider how often you should have your eyes checked. Personal lifestyle choices, including: Daily care of your teeth and gums. Regular physical activity. Eating a healthy diet. Avoiding tobacco and drug use. Limiting alcohol use. Practicing safe sex. Taking low doses of aspirin every day. Taking vitamin and mineral supplements as recommended by your health care provider. What happens during an annual well check? The services and screenings done by your health care  provider during your annual well check will depend on your age, overall health, lifestyle risk factors, and family history of disease. Counseling  Your health care provider may ask you questions about your: Alcohol use. Tobacco use. Drug use. Emotional well-being. Home and relationship well-being. Sexual activity. Eating habits. History of falls. Memory and ability to understand (cognition). Work and work Statistician. Screening  You may have the following tests or measurements: Height, weight, and BMI. Blood pressure. Lipid and cholesterol levels. These may be checked every 5 years, or more frequently if you are over 17 years old. Skin check. Lung cancer screening. You may have this screening every year starting at age 92 if you have a 30-pack-year history of smoking and currently smoke or have quit within the past 15 years. Fecal occult blood test (FOBT) of the stool. You may have this test every year starting at age 2. Flexible sigmoidoscopy or colonoscopy. You may have a sigmoidoscopy every 5 years or a colonoscopy every 10 years starting at age 67. Prostate cancer screening. Recommendations will vary depending on your family history and other risks. Hepatitis C blood test. Hepatitis B blood test. Sexually transmitted disease (STD) testing. Diabetes screening. This is done by checking your blood sugar (glucose) after you have not eaten for a while (fasting). You may have this done every 1-3 years. Abdominal aortic aneurysm (AAA) screening. You may need this if you are a current or former smoker. Osteoporosis. You may be screened starting at age 73 if you are at high risk. Talk with your health care provider about your test results, treatment options, and if necessary, the need for more tests. Vaccines  Your health care provider may recommend certain vaccines, such as: Influenza vaccine. This is recommended every year. Tetanus, diphtheria, and acellular pertussis (Tdap, Td)  vaccine. You may need a Td booster every 10 years. Zoster vaccine. You may need this after age 22. Pneumococcal 13-valent conjugate (PCV13) vaccine. One dose is recommended after age 37. Pneumococcal polysaccharide (PPSV23) vaccine. One dose is recommended after age 71. Talk to your health care provider about which screenings and vaccines you need and how often you need them. This information is not intended to replace advice given to you by your health care provider. Make sure you discuss any questions you have with your health care provider. Document Released: 05/22/2015 Document Revised: 01/13/2016 Document Reviewed: 02/24/2015 Elsevier Interactive Patient Education  2017 [employer] Prevention in the Home Falls can cause injuries. They can happen to people of all ages. There are many things you can do to make your home safe and to help prevent falls. What can I do on the outside of my home? Regularly fix the edges of walkways and driveways and fix any cracks. Remove anything that might make you trip as you walk through a door, such as a raised step or threshold. Trim any bushes or trees on the path to your home. Use bright outdoor lighting. Clear any walking paths of anything that might make someone trip, such as rocks or tools. Regularly check to see if handrails are loose or broken. Make sure that both sides of any steps have handrails. Any raised decks and porches should have guardrails on the edges. Have any leaves, snow, or ice cleared regularly. Use sand or salt on walking paths during winter. Clean up any spills in your garage right away. This includes oil or grease spills. What can I do in the bathroom? Use night lights. Install grab bars by the toilet and in the tub and shower. Do not use towel bars as grab bars. Use non-skid mats or decals in the tub or shower. If you need to sit down in the shower, use a plastic, non-slip stool. Keep the floor dry. Clean up any  water that spills on the floor as soon as it happens. Remove soap buildup in the tub or shower regularly. Attach bath mats securely with double-sided non-slip rug tape. Do not have throw rugs and other things on the floor that can make you trip. What can I do in the bedroom? Use night lights. Make sure that you have a light by your bed that is easy to reach. Do not use any sheets or blankets that are too big for your bed. They should not hang down onto the floor. Have a firm chair that has side arms. You can use this for support while you get dressed. Do not have throw rugs and other things on the floor that can make you trip. What can I do in the kitchen? Clean up any spills right away. Avoid walking on wet floors. Keep items that you use a lot in easy-to-reach places. If you need to reach something above you, use a strong step stool that has a grab bar. Keep electrical cords out of the way. Do not use floor polish or wax that makes floors slippery. If you must use wax, use non-skid floor wax. Do not have throw rugs and other things on the floor that can make you trip. What can I do with my stairs? Do not leave any items on the stairs. Make sure that there are  handrails on both sides of the stairs and use them. Fix handrails that are broken or loose. Make sure that handrails are as long as the stairways. Check any carpeting to make sure that it is firmly attached to the stairs. Fix any carpet that is loose or worn. Avoid having throw rugs at the top or bottom of the stairs. If you do have throw rugs, attach them to the floor with carpet tape. Make sure that you have a light switch at the top of the stairs and the bottom of the stairs. If you do not have them, ask someone to add them for you. What else can I do to help prevent falls? Wear shoes that: Do not have high heels. Have rubber bottoms. Are comfortable and fit you well. Are closed at the toe. Do not wear sandals. If you use a  stepladder: Make sure that it is fully opened. Do not climb a closed stepladder. Make sure that both sides of the stepladder are locked into place. Ask someone to hold it for you, if possible. Clearly mark and make sure that you can see: Any grab bars or handrails. First and last steps. Where the edge of each step is. Use tools that help you move around (mobility aids) if they are needed. These include: Canes. Walkers. Scooters. Crutches. Turn on the lights when you go into a dark area. Replace any light bulbs as soon as they burn out. Set up your furniture so you have a clear path. Avoid moving your furniture around. If any of your floors are uneven, fix them. If there are any pets around you, be aware of where they are. Review your medicines with your doctor. Some medicines can make you feel dizzy. This can increase your chance of falling. Ask your doctor what other things that you can do to help prevent falls. This information is not intended to replace advice given to you by your health care provider. Make sure you discuss any questions you have with your health care provider. Document Released: 02/19/2009 Document Revised: 10/01/2015 Document Reviewed: 05/30/2014 Elsevier Interactive Patient Education  2017 [employer].

## 2021-07-16 ENCOUNTER — Other Ambulatory Visit: Payer: Self-pay

## 2021-07-16 ENCOUNTER — Other Ambulatory Visit: Payer: Medicare Other

## 2021-07-16 ENCOUNTER — Other Ambulatory Visit (INDEPENDENT_AMBULATORY_CARE_PROVIDER_SITE_OTHER): Payer: Medicare Other

## 2021-07-16 DIAGNOSIS — E559 Vitamin D deficiency, unspecified: Secondary | ICD-10-CM

## 2021-07-16 DIAGNOSIS — E039 Hypothyroidism, unspecified: Secondary | ICD-10-CM

## 2021-07-16 DIAGNOSIS — N1831 Chronic kidney disease, stage 3a: Secondary | ICD-10-CM | POA: Diagnosis not present

## 2021-07-16 LAB — COMPREHENSIVE METABOLIC PANEL
ALT: 17 U/L (ref 0–53)
AST: 21 U/L (ref 0–37)
Albumin: 4.2 g/dL (ref 3.5–5.2)
Alkaline Phosphatase: 72 U/L (ref 39–117)
BUN: 15 mg/dL (ref 6–23)
CO2: 30 mEq/L (ref 19–32)
Calcium: 9.2 mg/dL (ref 8.4–10.5)
Chloride: 106 mEq/L (ref 96–112)
Creatinine, Ser: 1.04 mg/dL (ref 0.40–1.50)
GFR: 61.67 mL/min (ref >60.00)
Glucose, Bld: 88 mg/dL (ref 70–99)
Potassium: 4.7 mEq/L (ref 3.5–5.1)
Sodium: 142 mEq/L (ref 135–145)
Total Bilirubin: 1 mg/dL (ref 0.2–1.2)
Total Protein: 6.9 g/dL (ref 6.0–8.3)

## 2021-07-16 LAB — CBC WITH DIFFERENTIAL/PLATELET
Basophils Absolute: 0 10*3/uL (ref 0.0–0.1)
Basophils Relative: 0.9 % (ref 0.0–3.0)
Eosinophils Absolute: 0.2 10*3/uL (ref 0.0–0.7)
Eosinophils Relative: 5.3 % — ABNORMAL HIGH (ref 0.0–5.0)
HCT: 43.1 % (ref 39.0–52.0)
Hemoglobin: 14.6 g/dL (ref 13.0–17.0)
Lymphocytes Relative: 40.5 % (ref 12.0–46.0)
Lymphs Abs: 1.3 10*3/uL (ref 0.7–4.0)
MCHC: 34 g/dL (ref 30.0–36.0)
MCV: 92.6 fl (ref 78.0–100.0)
Monocytes Absolute: 0.2 10*3/uL (ref 0.1–1.0)
Monocytes Relative: 6.9 % (ref 3.0–12.0)
Neutro Abs: 1.5 10*3/uL (ref 1.4–7.7)
Neutrophils Relative %: 46.4 % (ref 43.0–77.0)
Platelets: 170 10*3/uL (ref 150.0–400.0)
RBC: 4.65 Mil/uL (ref 4.22–5.81)
RDW: 13.8 % (ref 11.5–15.5)
WBC: 3.3 10*3/uL — ABNORMAL LOW (ref 4.0–10.5)

## 2021-07-16 LAB — VITAMIN D 25 HYDROXY (VIT D DEFICIENCY, FRACTURES): VITD: 35.61 ng/mL (ref 30.00–100.00)

## 2021-07-16 LAB — TSH: TSH: 7.62 u[IU]/mL — ABNORMAL HIGH (ref 0.35–5.50)

## 2021-07-16 LAB — LIPID PANEL
Cholesterol: 205 mg/dL — ABNORMAL HIGH (ref 0–200)
HDL: 47.3 mg/dL (ref >39.00)
LDL Cholesterol: 130 mg/dL — ABNORMAL HIGH (ref 0–99)
NonHDL: 157.84
Total CHOL/HDL Ratio: 4
Triglycerides: 137 mg/dL (ref 0.0–149.0)
VLDL: 27.4 mg/dL (ref 0.0–40.0)

## 2021-07-17 LAB — T4, FREE: Free T4: 1 ng/dL (ref 0.8–1.8)

## 2021-07-23 ENCOUNTER — Other Ambulatory Visit: Payer: Self-pay

## 2021-07-23 ENCOUNTER — Encounter: Payer: Self-pay | Admitting: Family Medicine

## 2021-07-23 ENCOUNTER — Ambulatory Visit (INDEPENDENT_AMBULATORY_CARE_PROVIDER_SITE_OTHER): Payer: Medicare Other | Admitting: Family Medicine

## 2021-07-23 VITALS — BP 132/82 | HR 66 | Temp 97.3°F | Ht 64.25 in | Wt 181.2 lb

## 2021-07-23 DIAGNOSIS — N401 Enlarged prostate with lower urinary tract symptoms: Secondary | ICD-10-CM

## 2021-07-23 DIAGNOSIS — M79601 Pain in right arm: Secondary | ICD-10-CM

## 2021-07-23 DIAGNOSIS — K5909 Other constipation: Secondary | ICD-10-CM

## 2021-07-23 DIAGNOSIS — Z66 Do not resuscitate: Secondary | ICD-10-CM | POA: Diagnosis not present

## 2021-07-23 DIAGNOSIS — E039 Hypothyroidism, unspecified: Secondary | ICD-10-CM

## 2021-07-23 DIAGNOSIS — R3912 Poor urinary stream: Secondary | ICD-10-CM

## 2021-07-23 DIAGNOSIS — E559 Vitamin D deficiency, unspecified: Secondary | ICD-10-CM

## 2021-07-23 DIAGNOSIS — N1831 Chronic kidney disease, stage 3a: Secondary | ICD-10-CM

## 2021-07-23 DIAGNOSIS — Z7189 Other specified counseling: Secondary | ICD-10-CM | POA: Diagnosis not present

## 2021-07-23 DIAGNOSIS — D696 Thrombocytopenia, unspecified: Secondary | ICD-10-CM

## 2021-07-23 DIAGNOSIS — I2510 Atherosclerotic heart disease of native coronary artery without angina pectoris: Secondary | ICD-10-CM | POA: Diagnosis not present

## 2021-07-23 DIAGNOSIS — J449 Chronic obstructive pulmonary disease, unspecified: Secondary | ICD-10-CM

## 2021-07-23 MED ORDER — ATENOLOL 50 MG PO TABS: 50.0000 mg | ORAL_TABLET | Freq: Every day | ORAL | 3 refills | Status: DC

## 2021-07-23 MED ORDER — TAMSULOSIN HCL 0.4 MG PO CAPS: 0.4000 mg | ORAL_CAPSULE | Freq: Every day | ORAL | 3 refills | Status: DC

## 2021-07-23 NOTE — Assessment & Plan Note (Signed)
Improved on recheck. ?COVID related.  ?

## 2021-07-23 NOTE — Assessment & Plan Note (Signed)
S/p TURP ?Stable period on flomax.  ?

## 2021-07-23 NOTE — Assessment & Plan Note (Signed)
Levels actually normal on recent testing.  ?

## 2021-07-23 NOTE — Assessment & Plan Note (Addendum)
Advanced directive discussion - discussed. Would want daughter [name] to be HCPOA. Would want to be DNR/DNI, as doesn't want CPR/ compression/ intubation. [place] form provided today. They will work on form and provide [place] a copy when complete/notarized.  ?

## 2021-07-23 NOTE — Assessment & Plan Note (Addendum)
Levels better - although has not been regular with vit D replacement. He has been using more ensure.  ?

## 2021-07-23 NOTE — Assessment & Plan Note (Signed)
Carries this diagnosis. ?diagnosis.  ?Overall seems asymptomatic. Did not benefit from incruse ellipta.  ?

## 2021-07-23 NOTE — Assessment & Plan Note (Signed)
Exam suspicious for biceps tendonitis and possible RTC tendonitis, r/p biceps rupture - no true rupture appreciated on exam. Provided with exercises from [facility] pt advisor on biceps tendonitis. If not improved with this, recommend f/u with sports medicine.  ?

## 2021-07-23 NOTE — Patient Instructions (Signed)
Traiganos copia de directivos avanzados cuando lo complete.  ?Tiene tendonitis de mangito rotatorio y biceps. Trate ejercicios que le he dado hoy.  ?Si no ayuda, regresar con Dr Donnie Aho doctor de medicina deportiva.  ?Regresar en 1 a?o para proximo examen fisico.  ? ?Mantenimiento de la salud despu?s de los 86 a?os de edad ?Health Maintenance After Age 86 ?Despu?s de los 65 a?os de edad, corre un riesgo mayor de Tourist information centre manager enfermedades e infecciones a Barrister's clerk, como tambi?n de sufrir lesiones por ca?das. Las ca?das son la causa principal de las fracturas de huesos y lesiones en la cabeza de personas mayores de 86 a?os de edad. Recibir cuidados preventivos de forma regular puede ayudarlo a mantenerse saludable y en buen [place]. Los cuidados preventivos incluyen realizarse an?lisis de forma regular y Actor en el estilo de vida seg?n las recomendaciones del m?dico. Converse con el m?dico sobre lo siguiente: ?Las pruebas de detecci?n y los an?lisis que debe Dispensing optician. Una prueba de detecci?n es un estudio que se para Hydrographic surveyor la presencia de una enfermedad cuando no tiene s?ntomas. ?Un plan de dieta y ejercicios adecuado para usted. ??Qu? debo saber sobre las pruebas de detecci?n y los an?lisis para prevenir ca?das? ?Realizarse pruebas de detecci?n y an?lisis es la mejor manera de Hydrographic surveyor un problema de salud de forma temprana. El diagn?stico y tratamiento tempranos le brindan la mejor oportunidad de Chief Technology Officer las afecciones m?dicas que son comunes despu?s de los 86 a?os de edad. Ciertas afecciones y elecciones de estilo de vida pueden hacer que sea m?s propenso a sufrir una ca?da. El m?dico puede recomendarle lo siguiente: ?Controles regulares de la visi?n. Una visi?n deficiente y [place] cataratas pueden hacer que sea m?s propenso a sufrir una ca?da. Si [place] lentes, aseg?rese de obtener una receta actualizada si su visi?n cambia. ?Revisi?n de medicamentos. Revise regularmente  con el m?dico todos los medicamentos que toma, incluidos los medicamentos de [place]. Consulte al m?dico [employer] secundarios que pueden hacer que sea m?s propenso a sufrir una ca?da. Informe al m?dico si alguno de los medicamentos que toma lo hace sentir mareado o somnoliento. ?Controles de fuerza y equilibrio. El m?dico puede recomendar ciertos estudios para controlar su fuerza y equilibrio al estar de pie, al caminar o al cambiar de posici?n. ?Examen de los pies. El dolor y Chiropractor en los pies, como tambi?n no utilizar el calzado [place], pueden hacer que sea m?s propenso a sufrir una ca?da. ?Pruebas de detecci?n, que incluyen las siguientes: ?Pruebas de detecci?n para la osteoporosis. La osteoporosis es una afecci?n que hace que los huesos se tornen m?s d?biles y se quiebren con m?s facilidad. ?Pruebas de detecci?n para la presi?n arterial. Los cambios en la presi?n arterial y los medicamentos para controlar la presi?n arterial pueden hacerlo sentir mareado. ?Prueba de detecci?n de la depresi?n. Es m?s probable que sufra una ca?da si tiene miedo a caerse, se siente deprimido o se siente incapaz de Lexicographer. ?Prueba de detecci?n de consumo de alcohol. Beber demasiado alcohol puede afectar su equilibrio y [employer] que sea m?s propenso a sufrir una ca?da. ?Siga estas indicaciones en su casa: ?Estilo de vida ?No beba alcohol si: ?Su m?dico le indica no hacerlo. ?Si bebe alcohol: ?Limite la cantidad que bebe a lo siguiente: ?De 0 a 1 medida por d?a para las mujeres. ?De 0 a 2 medidas por d?a para los hombres. ?Sepa cu?nta cantidad de alcohol hay en las bebidas que  toma. En los [place], una medida equivale a una botella de cerveza de 12 oz (355 ml), un vaso de vino de 5 oz (148 ml) o un vaso de una bebida alcoh?lica de alta graduaci?n de 1? oz (44 ml). ?No consuma ning?n producto que contenga nicotina o tabaco. Estos productos incluyen cigarrillos, tabaco  para mascar y aparatos de vapeo, como los cigarrillos electr?nicos. Si necesita ayuda para dejar de consumir estos productos, consulte al m?dico. ?Actividad ? ?Siga un programa de ejercicio regular para mantenerse en forma. Esto lo ayudar? a Visual merchandiser. Consulte al m?dico qu? tipos de ejercicios son adecuados para usted. ?Si necesita un bast?n o un andador, ?selo seg?n las recomendaciones del m?dico. ?Utilice calzado con buen apoyo y suela antideslizante. ?Seguridad ? ?Retire los [place] puedan causar tropiezos tales como alfombras, cables u obst?culos. ?Instale equipos de seguridad, como barras para sost?n en los ba?os y barandas de seguridad en las escaleras. ?[place] habitaciones y los pasillos bien iluminados. ?Indicaciones generales ?Hable con el m?dico sobre sus riesgos de sufrir una ca?da. Inf?rmele a su m?dico si: ?Se cae. Aseg?rese de informarle a su m?dico acerca de todas las ca?das, incluso aquellas que parecen ser [employer]. ?Se siente mareado, cansado (tiene fatiga) o siente que pierde el equilibrio. ?Use los medicamentos de venta libre y los recetados solamente como se lo haya indicado el m?dico. Estos incluyen suplementos. ?Siga una dieta sana y [place] un peso saludable. Una dieta saludable incluye productos l?cteos descremados, carnes bajas en contenido de grasa ([place]), fibra de granos enteros, frijoles y Pocahontas frutas y verduras. ?Mant?ngase al d?a con las vacunas. ?Real?cese los estudios de rutina de la salud, dentales y de Public librarian. ?Resumen ?Tener un estilo de vida saludable y recibir cuidados preventivos pueden ayudar a Theatre stage manager salud y el bienestar despu?s de los 86 a?os de [place]. ?Realizarse pruebas de detecci?n y an?lisis es la mejor manera de Hydrographic surveyor un problema de salud de forma temprana y Lourena Simmonds a Product/process development scientist una ca?da. El diagn?stico y tratamiento tempranos le brindan la mejor oportunidad de Chief Technology Officer las afecciones m?dicas m?s comunes en las personas mayores de 6  a?os de edad. ?Las ca?das son la causa principal de las fracturas de huesos y lesiones en la cabeza de personas mayores de 28 a?os de edad. Tome precauciones para evitar una ca?da en su casa. ?Trabaje con el m?dico para saber qu? cambios que puede hacer para mejorar su salud y [place], y [place]. ?Esta informaci?n no tiene Marine scientist el consejo del m?dico. Aseg?rese de hacerle al m?dico cualquier pregunta que tenga. ?Document Revised: 09/30/2020 Document Reviewed: 09/30/2020 ?Elsevier Patient Education ? [place]. ? ?

## 2021-07-23 NOTE — Assessment & Plan Note (Signed)
Chronic, without hypothyroid symptoms. fT4 normal. Previous levothyroxine trial without significant benefit.  ?Will just monitor off levothyroxine.  ?

## 2021-07-23 NOTE — Assessment & Plan Note (Signed)
Followed by Dr Edd Arbour cardiology.  ?

## 2021-07-23 NOTE — Assessment & Plan Note (Signed)
Ongoing struggle despite linzess every few days.  ?Discussed dietary measures including prunes, kiwi fruit and prune and apple and pear juice.  ?

## 2021-07-23 NOTE — Assessment & Plan Note (Addendum)
Discussed with pt and daughter [name], confirmed with patient. [place] form provided today for them to have in available location at home.  ?

## 2021-07-23 NOTE — Progress Notes (Signed)
? ? Patient ID: Joseph Osborn, male    DOB: [birth year], 88 y.o.   MRN: [id number] ? ?This visit was conducted in person. ? ?BP 132/82   Pulse 66   Temp (!) 97.3 ?F (36.3 ?C) (Temporal)   Ht 5' 4.25" (1.632 m)   Wt 181 lb 4 oz (82.2 kg)   SpO2 97%   BMI 30.87 kg/m?   ? ?CC: CPE ?Subjective:  ? ?HPI: ?Joseph Osborn is a 88 y.o. male presenting on 07/23/2021 for Annual Exam [facility] prt 2.  Pt accompanied by daughter, Joseph Osborn. ) ? ? ?Saw health advisor 2 wks ago for medicare wellness visit. Note reviewed.   ? ?No results found.  ?Flowsheet Row Clinical Support from 07/15/2021 in [facility] [place]  ?PHQ-2 Total Score 1  ? ?  ?  ?Fall Risk  07/15/2021 07/14/2020  ?Falls in the past year? 0 0  ?Number falls in past yr: 0 0  ?Injury with Fall? 0 0  ?Risk for fall due to : Other (Comment) Medication side effect  ?Risk for fall due to: Comment lost balance -  ?Follow up Falls prevention discussed Falls evaluation completed;Falls prevention discussed  ? ?R handed.  ?R biceps injury while lifting heavy seat. He again reinjured R arm this week. Has been seeing chiropractor.  ? ?Requests DMV handicap placard - discussed likely doesn't qualify for this based on inclusion criteria.  ? ?Preventative: ?Colon cancer screening - no blood in stool. Known hemorrhoids. iFOB normal 03/2020 - age out ?Prostate cancer screening - BPH s/p TURP. Aged out. Ongoing LUTS despite flomax 0.'4mg'$  daily  ?Lung cancer screening - aged out ?Flu shot - yearly  ?COVID vaccine Moderna 08/2019, 09/2019, booster 05/2020  ?Tetanus shot - unsure ?Pneumonia shot - still awaiting records ?Shingrix - still awaiting records ?Advanced directive discussion - discussed. Would want daughter Joseph Osborn to be HCPOA. Would want to be DNR/DNI, as doesn't want CPR/ compression/ intubation. [place] form provided today. They will work on form and provide [place] a copy when complete/notarized.  ?Seat belt use discussed ?Sunscreen use discussed. No changing  moles on skin. Sees derm yearly Joseph Osborn) ?Dentist - saw once, doesn't want to return.  ?Eye exam - yearly (Joseph Osborn) - has declined cataract surgery  ?Non smoker  ?Alcohol - none ?Constipation - ongoing despite linzess QOD. Poor water intake. Asks about prune juice  ?Bladder - stable period on flomax - no incontinence  ? ?Lives with wife, daughter Joseph Osborn) and her husband and son ?From [place] ?Occ: retired, previously worked Armed forces logistics/support/administrative officer  ?   ? ?Relevant past medical, surgical, family and social history reviewed and updated as indicated. Interim medical history since our last visit reviewed. ?Allergies and medications reviewed and updated. ?Outpatient Medications Prior to Visit  ?Medication Sig Dispense Refill  ? Acetaminophen (TYLENOL PO) Take by mouth at bedtime. As needed    ? linaclotide (LINZESS) 72 MCG capsule Take 1 capsule (72 mcg total) by mouth every other day. 45 capsule 1  ? atenolol (TENORMIN) 50 MG tablet Take 1 tablet (50 mg total) by mouth daily. 90 tablet 3  ? tamsulosin (FLOMAX) 0.4 MG CAPS capsule Take 1 capsule (0.4 mg total) by mouth daily. 90 capsule 3  ? ascorbic acid (VITAMIN C) 500 MG tablet Take 1 tablet (500 mg total) by mouth daily. (Patient not taking: Reported on 07/15/2021) 90 tablet 1  ? ondansetron (ZOFRAN) 4 MG tablet Take 1 tablet (4 mg total) by mouth  every 8 (eight) hours as needed for nausea or vomiting. (Patient not taking: Reported on 07/15/2021) 20 tablet 0  ? zinc sulfate 220 (50 Zn) MG capsule Take 1 capsule (220 mg total) by mouth daily. (Patient not taking: Reported on 07/15/2021) 90 capsule 1  ? ?No facility-administered medications prior to visit.  ?  ? ?Per HPI unless specifically indicated in ROS section below ?Review of Systems ? ?Objective:  ?BP 132/82   Pulse 66   Temp (!) 97.3 ?F (36.3 ?C) (Temporal)   Ht 5' 4.25" (1.632 m)   Wt 181 lb 4 oz (82.2 kg)   SpO2 97%   BMI 30.87 kg/m?   ?Wt Readings from Last 3 Encounters:  ?07/23/21 181 lb 4 oz (82.2 kg)   ?07/15/21 175 lb (79.4 kg)  ?04/27/21 175 lb 1 oz (79.4 kg)  ?  ?  ?Physical Exam ?Vitals and nursing note reviewed.  ?Constitutional:   ?   General: He is not in acute distress. ?   Appearance: Normal appearance. He is well-developed. He is not ill-appearing.  ?HENT:  ?   Head: Normocephalic and atraumatic.  ?   Right Ear: Hearing, tympanic membrane, ear canal and external ear normal.  ?   Left Ear: Hearing, tympanic membrane, ear canal and external ear normal.  ?Eyes:  ?   General: No scleral icterus. ?   Extraocular Movements: Extraocular movements intact.  ?   Conjunctiva/sclera: Conjunctivae normal.  ?   Pupils: Pupils are equal, round, and reactive to light.  ?Neck:  ?   Thyroid: No thyroid mass or thyromegaly.  ?Cardiovascular:  ?   Rate and Rhythm: Normal rate and regular rhythm.  ?   Pulses: Normal pulses.     ?     Radial pulses are 2+ on the right side and 2+ on the left side.  ?   Heart sounds: Normal heart sounds. No murmur heard. ?Pulmonary:  ?   Effort: Pulmonary effort is normal. No respiratory distress.  ?   Breath sounds: Normal breath sounds. No wheezing, rhonchi or rales.  ?Abdominal:  ?   General: Bowel sounds are normal. There is no distension.  ?   Palpations: Abdomen is soft. There is no mass.  ?   Tenderness: There is no abdominal tenderness. There is no guarding or rebound.  ?   Hernia: No hernia is present.  ?Musculoskeletal:     ?   General: Normal range of motion.  ?   Cervical back: Normal range of motion and neck supple.  ?   Right lower leg: No edema.  ?   Left lower leg: No edema.  ?   Comments:  ?Bulge to biceps, but noted bilaterally ?L shoulder WNL  ?R shoulder exam:  ?No deformity of shoulders on inspection.  ?No pain with palpation of shoulder landmarks.  ?FROM in abduction and forward flexion.  ?Discomfort with testing SITS in int rotation.  ?+ pain with empty can sign.  ?++ Speed test.  ?No impingement.  ?No pain with rotation of humeral head in [facility] joint.  ?   ?Lymphadenopathy:  ?   Cervical: No cervical adenopathy.  ?Skin: ?   General: Skin is warm and dry.  ?   Findings: No rash.  ?Neurological:  ?   General: No focal deficit present.  ?   Mental Status: He is alert and oriented to person, place, and time.  ?Psychiatric:     ?   Mood and Affect: Mood normal.     ?  Behavior: Behavior normal.     ?   Thought Content: Thought content normal.     ?   Judgment: Judgment normal.  ? ?   ?Results for orders placed or performed in visit on 07/16/21  ?T4, free  ?Result Value Ref Range  ? Free T4 1.0 0.8 - 1.8 ng/dL  ? ?Lab Results  ?Component Value Date  ? CREATININE 1.04 07/16/2021  ? BUN 15 07/16/2021  ? NA 142 07/16/2021  ? K 4.7 07/16/2021  ? CL 106 07/16/2021  ? CO2 30 07/16/2021  ?  ?Lab Results  ?Component Value Date  ? TSH 7.62 (H) 07/16/2021  ?  ?Lab Results  ?Component Value Date  ? WBC 3.3 (L) 07/16/2021  ? HGB 14.6 07/16/2021  ? HCT 43.1 07/16/2021  ? MCV 92.6 07/16/2021  ? PLT 170.0 07/16/2021  ? ?Lab Results  ?Component Value Date  ? CHOL 205 (H) 07/16/2021  ? HDL 47.30 07/16/2021  ? LDLCALC 130 (H) 07/16/2021  ? TRIG 137.0 07/16/2021  ? CHOLHDL 4 07/16/2021  ?  ?Assessment & Plan:  ?This visit occurred during the SARS-CoV-2 public health emergency.  Safety protocols were in place, including screening questions prior to the visit, additional usage of staff PPE, and extensive cleaning of exam room while observing appropriate contact time as indicated for disinfecting solutions.  ? ?Problem List Items Addressed This Visit   ? ? Advanced directives, counseling/discussion - Primary (Chronic)  ?  Advanced directive discussion - discussed. Would want daughter Joseph Osborn to be HCPOA. Would want to be DNR/DNI, as doesn't want CPR/ compression/ intubation. [place] form provided today. They will work on form and provide [place] a copy when complete/notarized.  ?  ?  ? DNR (do not resuscitate) (Chronic)  ?  Discussed with pt and daughter Joseph Osborn, confirmed with patient. [place] form  provided today for them to have in available location at home.  ?  ?  ? Benign prostatic hyperplasia  ?  S/p TURP ?Stable period on flomax.  ?  ?  ? Relevant Medications  ? tamsulosin (FLOMAX) 0.4 MG CAPS capsule  ? CAD (coronary artery di

## 2022-01-12 ENCOUNTER — Encounter: Payer: Self-pay | Admitting: Family Medicine

## 2022-01-12 ENCOUNTER — Ambulatory Visit (INDEPENDENT_AMBULATORY_CARE_PROVIDER_SITE_OTHER)
Admission: RE | Admit: 2022-01-12 | Discharge: 2022-01-12 | Disposition: A | Discharge status: Home / Self Care | Payer: Medicare Other | Source: Ambulatory Visit | Attending: Family Medicine | Admitting: Family Medicine

## 2022-01-12 ENCOUNTER — Ambulatory Visit (INDEPENDENT_AMBULATORY_CARE_PROVIDER_SITE_OTHER): Payer: Medicare Other | Admitting: Family Medicine

## 2022-01-12 VITALS — BP 110/70 | HR 75 | Temp 97.7°F | Ht 64.25 in | Wt 179.4 lb

## 2022-01-12 DIAGNOSIS — M25561 Pain in right knee: Secondary | ICD-10-CM | POA: Diagnosis not present

## 2022-01-12 DIAGNOSIS — G8929 Other chronic pain: Secondary | ICD-10-CM | POA: Diagnosis not present

## 2022-01-12 DIAGNOSIS — I2510 Atherosclerotic heart disease of native coronary artery without angina pectoris: Secondary | ICD-10-CM | POA: Diagnosis not present

## 2022-01-12 DIAGNOSIS — M5431 Sciatica, right side: Secondary | ICD-10-CM

## 2022-01-12 MED ORDER — PREDNISONE 20 MG PO TABS: 20.0000 mg | ORAL_TABLET | Freq: Every day | ORAL | 0 refills | Status: DC

## 2022-01-12 NOTE — Progress Notes (Signed)
Joseph Alverio T. Atthew Coutant, MD, [place] at [facility] [place], [address]  Phone: [phone]  FAX: [place] - 88 y.o. male  MRN [id number]  Date of Birth: Mar 29, 1926  Date: 01/12/2022  PCP: Ria Bush, MD  Referral: Ria Bush, MD  Chief Complaint  Patient presents with   Knee Pain    Right   Subjective:   Joseph Osborn is a 88 y.o. very pleasant male patient with Body mass index is 30.55 kg/m. who presents with the following:  Very pleasant 88 year old gentleman who is quite active for age.  He is accompanied by his daughter who also provides translation services in [place].  He was sleeping and around 4:00 in the morning he developed some pain in his right knee, and he has had an effusion.  He also has some pain in the right buttocks and pain radiating down the right leg, as well.  He has had a history of some sciatica in the past.  He points to an area of the medial compartment of the knee as the area of maximal tenderness.  He has no significant history of major knee problems in the past, and he has never had prior operative intervention.  He is walking with a mild limp, he does have some pain with standing from a seated position.  He has taken some NSAIDs, and he thinks that he has gotten a little bit better with this.  Review of Systems is noted in the HPI, as appropriate  Objective:   BP 110/70   Pulse 75   Temp 97.7 F (36.5 C) (Oral)   Ht 5' 4.25" (1.632 m)   Wt 179 lb 6 oz (81.4 kg)   SpO2 95%   BMI 30.55 kg/m   GEN: No acute distress; alert,appropriate. PULM: Breathing comfortably in no respiratory distress PSYCH: Normally interactive.   Right knee: Mild effusion.  Nontender with loading the medial lateral patellar facet.  Stable to varus and valgus stress.  Lachman and drawer testing is all normal.  Some pain with deep flexion as well as with  McMurray's, without mechanical symptoms.  Bounce home testing is negative.  He does rise from a seated position with some pain.  There is some mild medial joint line tenderness.  Laboratory and Imaging Data: X-rays: AP Bilateral Weight-bearing, Weightbearing Lateral, Sunrise views, Lutricia Feil view Indication: knee pain Findings: Mild tricompartmental osteoarthritis. Electronically Signed  By: Owens Loffler, MD On: 01/12/2022  3:40 PM EDT   Assessment and Plan:     ICD-10-CM   1. Acute pain of right knee  M25.561     2. Chronic pain of right knee  M25.561 DG Knee 4 Views W/Patella Right   G89.29     3. Sciatica, right side  M54.31      While he is having some underlying sciatica, which she has had in the past, I think the predominant area of pain right now is his knee.  He does have some baseline osteoarthritis, so this would fall along with some acute on chronic osteoarthritis with exacerbation.  For now, with both his back and his knee involved, I am going to give him a short course of 20 mg of prednisone, hopefully this will help calm this down further.  Very pleasant gentleman from [place], and I enjoyed meeting him today.  Medication Management during today's office visit: Meds ordered this encounter  Medications   predniSONE (  DELTASONE) 20 MG tablet    Sig: Take 1 tablet (20 mg total) by mouth daily with breakfast.    Dispense:  7 tablet    Refill:  0   There are no discontinued medications.  Orders placed today for conditions managed today: Orders Placed This Encounter  Procedures   DG Knee 4 Views W/Patella Right    Disposition: No follow-ups on file.  Dragon [employer] One speech-to-text software was used for transcription in this dictation.  Possible transcriptional errors can occur using Editor, commissioning.   Signed,  Maud Deed. Ivianna Notch, MD   Outpatient Encounter Medications as of 01/12/2022  Medication Sig   Acetaminophen (TYLENOL PO) Take by mouth at  bedtime. As needed   atenolol (TENORMIN) 50 MG tablet Take 1 tablet (50 mg total) by mouth daily.   isosorbide mononitrate (IMDUR) 30 MG 24 hr tablet Take 30 mg by mouth daily.   linaclotide (LINZESS) 72 MCG capsule Take 1 capsule (72 mcg total) by mouth every other day.   predniSONE (DELTASONE) 20 MG tablet Take 1 tablet (20 mg total) by mouth daily with breakfast.   tamsulosin (FLOMAX) 0.4 MG CAPS capsule Take 1 capsule (0.4 mg total) by mouth daily.   No facility-administered encounter medications on file as of 01/12/2022.

## 2022-01-27 ENCOUNTER — Ambulatory Visit (INDEPENDENT_AMBULATORY_CARE_PROVIDER_SITE_OTHER): Payer: Medicare Other | Admitting: Family Medicine

## 2022-01-27 ENCOUNTER — Encounter: Payer: Self-pay | Admitting: Family Medicine

## 2022-01-27 VITALS — BP 124/80 | HR 75 | Temp 97.7°F | Ht 64.25 in | Wt 177.5 lb

## 2022-01-27 DIAGNOSIS — M25561 Pain in right knee: Secondary | ICD-10-CM

## 2022-01-27 DIAGNOSIS — M1711 Unilateral primary osteoarthritis, right knee: Secondary | ICD-10-CM | POA: Diagnosis not present

## 2022-01-27 DIAGNOSIS — I2510 Atherosclerotic heart disease of native coronary artery without angina pectoris: Secondary | ICD-10-CM | POA: Diagnosis not present

## 2022-01-27 MED ORDER — TRIAMCINOLONE ACETONIDE 40 MG/ML IJ SUSP
40.0000 mg | Freq: Once | INTRAMUSCULAR | Status: AC
  Administered 2022-01-27: 40 mg via INTRA_ARTICULAR

## 2022-01-27 NOTE — Progress Notes (Signed)
Joseph Osborn T. Joseph Nahm, MD, [name] at [facility] [name], [address]  Phone: [phone]  FAX: [place] - 88 y.o. male  MRN [id number]  Date of Birth: November 25, 1925  Date: 01/27/2022  PCP: Ria Bush, MD  Referral: Ria Bush, MD  Chief Complaint  Patient presents with   Knee Pain    Ongoing Right Knee Pain-seen 01/12/22   Subjective:   Joseph Osborn is a 88 y.o. very pleasant male patient with Body mass index is 30.23 kg/m. who presents with the following:  Pleasant gentleman who recall meeting well a few weeks ago with some ongoing right-sided knee pain.  At that point he was having some knee pain as well as some back pain, and I did give him a round of some oral steroids.  He is here today in follow-up.  He does have some mild to moderate osteoarthritis of the knee, as well.  He is 88 years old.  Got a little better with oral pred.  But his symptoms seem to return fairly quickly.  He is now having more swelling and effusion in the knee and he is limping more.  He is worried about falling.  His daughter is here and provides additional history and helps him with translation.  Aspirate 40 cc of joint fluid  Review of Systems is noted in the HPI, as appropriate  Objective:   BP 124/80   Pulse 75   Temp 97.7 F (36.5 C) (Oral)   Ht 5' 4.25" (1.632 m)   Wt 177 lb 8 oz (80.5 kg)   SpO2 95%   BMI 30.23 kg/m   GEN: No acute distress; alert,appropriate. PULM: Breathing comfortably in no respiratory distress PSYCH: Normally interactive.   Right-sided moderate ballotable effusion.  He has medial and lateral joint line tenderness.  Stable to varus and valgus stress.  ACL and PCL are intact.  Forced flexion causes pain.  Laboratory and Imaging Data: DG Knee 4 Views W/Patella Right  Result Date: 01/13/2022 CLINICAL DATA:  Chronic right knee pain. EXAM: RIGHT KNEE  - COMPLETE 4+ VIEW COMPARISON:  None Available. FINDINGS: Right knee: Moderate lateral greater than medial compartment joint space narrowing. Mild patella alta. Mild to moderate superior and mild inferior patellar degenerative osteophytosis. Mild-to-moderate joint effusion. No acute fracture is seen. No dislocation. Left knee: On frontal view, there is mild to moderate medial and mild lateral compartment joint space narrowing. IMPRESSION: 1. Right knee moderate lateral and mild medial and patellofemoral joint osteoarthritis. 2. Right knee mild to moderate joint effusion. 3. Left knee mild-to-moderate medial and mild lateral compartment joint space narrowing. Electronically Signed   By: Yvonne Kendall M.D.   On: 01/13/2022 14:56     Assessment and Plan:     ICD-10-CM   1. Acute pain of right knee  M25.561 triamcinolone acetonide (KENALOG-40) injection 40 mg    2. Primary osteoarthritis of right knee  M17.11 triamcinolone acetonide (KENALOG-40) injection 40 mg     Acute on chronic osteoarthritis with exacerbation.  Think his knee is progressed and he is certainly having more of an effusion right now and having pain with functional disability.  I am going to aspirate the patient's knee to try to give him some immediate relief and inject the knee with some corticosteroid.  Aspiration/Injection Procedure Note Joseph Osborn June 08, 1925 Date of procedure: 01/27/2022  Procedure: Large Joint Aspiration / Injection with synovial  fluid aspiration of knee Indications: Pain  Procedure Details Patient verbally consented; risks, benefits, and alternatives explained. Patient prepped with Chloraprep. Ethyl chloride for anesthesia. 10 cc of 1% Lidocaine used in wheal then injected Subcutaneous fashion with 22 gauge needle on lateral approach. Under sterilne conditions, 18 gauge needle used via lateral approach to aspirate 40 cc of yellowish, synovial fluid. Then 9 cc of Lidocaine 1% and 1 mL of Kenalog 40  mg injected. Tolerated well, decreased pain, no complications. Medication: 1 mL of Kenalog 40 mg   Medication Management during today's office visit: Meds ordered this encounter  Medications   triamcinolone acetonide (KENALOG-40) injection 40 mg   Medications Discontinued During This Encounter  Medication Reason   predniSONE (DELTASONE) 20 MG tablet Completed Course    Orders placed today for conditions managed today: No orders of the defined types were placed in this encounter.   Disposition: No follow-ups on file.  [employer] One speech-to-text software was used for transcription in this dictation.  Possible transcriptional errors can occur using Editor, commissioning.   Signed,  Maud Deed. Joseph Dobie, MD   Outpatient Encounter Medications as of 01/27/2022  Medication Sig   Acetaminophen (TYLENOL PO) Take by mouth at bedtime. As needed   atenolol (TENORMIN) 50 MG tablet Take 1 tablet (50 mg total) by mouth daily.   isosorbide mononitrate (IMDUR) 30 MG 24 hr tablet Take 30 mg by mouth daily.   linaclotide (LINZESS) 72 MCG capsule Take 1 capsule (72 mcg total) by mouth every other day.   tamsulosin (FLOMAX) 0.4 MG CAPS capsule Take 1 capsule (0.4 mg total) by mouth daily.   [DISCONTINUED] predniSONE (DELTASONE) 20 MG tablet Take 1 tablet (20 mg total) by mouth daily with breakfast.   [EXPIRED] triamcinolone acetonide (KENALOG-40) injection 40 mg    No facility-administered encounter medications on file as of 01/27/2022.

## 2022-02-14 ENCOUNTER — Ambulatory Visit (INDEPENDENT_AMBULATORY_CARE_PROVIDER_SITE_OTHER): Payer: Medicare Other | Admitting: Family Medicine

## 2022-02-14 ENCOUNTER — Encounter: Payer: Self-pay | Admitting: Family Medicine

## 2022-02-14 VITALS — BP 118/82 | HR 78 | Temp 97.9°F | Ht 64.25 in | Wt 175.0 lb

## 2022-02-14 DIAGNOSIS — Z23 Encounter for immunization: Secondary | ICD-10-CM | POA: Diagnosis not present

## 2022-02-14 DIAGNOSIS — R3912 Poor urinary stream: Secondary | ICD-10-CM | POA: Diagnosis not present

## 2022-02-14 DIAGNOSIS — M150 Primary generalized (osteo)arthritis: Secondary | ICD-10-CM

## 2022-02-14 DIAGNOSIS — M25561 Pain in right knee: Secondary | ICD-10-CM | POA: Diagnosis not present

## 2022-02-14 DIAGNOSIS — J449 Chronic obstructive pulmonary disease, unspecified: Secondary | ICD-10-CM | POA: Diagnosis not present

## 2022-02-14 DIAGNOSIS — K5909 Other constipation: Secondary | ICD-10-CM

## 2022-02-14 DIAGNOSIS — I25119 Atherosclerotic heart disease of native coronary artery with unspecified angina pectoris: Secondary | ICD-10-CM

## 2022-02-14 DIAGNOSIS — M159 Polyosteoarthritis, unspecified: Secondary | ICD-10-CM

## 2022-02-14 DIAGNOSIS — R0609 Other forms of dyspnea: Secondary | ICD-10-CM

## 2022-02-14 DIAGNOSIS — N401 Enlarged prostate with lower urinary tract symptoms: Secondary | ICD-10-CM

## 2022-02-14 DIAGNOSIS — R55 Syncope and collapse: Secondary | ICD-10-CM

## 2022-02-14 DIAGNOSIS — R82998 Other abnormal findings in urine: Secondary | ICD-10-CM

## 2022-02-14 DIAGNOSIS — J310 Chronic rhinitis: Secondary | ICD-10-CM

## 2022-02-14 LAB — POC URINALSYSI DIPSTICK (AUTOMATED)
Bilirubin, UA: NEGATIVE
Blood, UA: NEGATIVE
Glucose, UA: NEGATIVE
Ketones, UA: NEGATIVE
Leukocytes, UA: NEGATIVE
Nitrite, UA: NEGATIVE
Protein, UA: NEGATIVE
Spec Grav, UA: 1.02 (ref 1.010–1.025)
Urobilinogen, UA: 0.2 E.U./dL
pH, UA: 6 (ref 5.0–8.0)

## 2022-02-14 MED ORDER — FEXOFENADINE HCL 180 MG PO TABS: 180.0000 mg | ORAL_TABLET | Freq: Every day | ORAL | Status: DC

## 2022-02-14 MED ORDER — ACETAMINOPHEN 500 MG PO TABS: 1000.0000 mg | ORAL_TABLET | Freq: Three times a day (TID) | ORAL | Status: AC

## 2022-02-14 NOTE — Progress Notes (Unsigned)
Patient ID: Joseph Osborn, male    DOB: April 20, 1926, 88 y.o.   MRN: [id number]  This visit was conducted in person.  BP 118/82 (BP Location: Right Arm, Patient Position: Sitting, Cuff Size: Normal)   Pulse 78   Temp 97.9 F (36.6 C)   Ht 5' 4.25" (1.632 m)   Wt 175 lb (79.4 kg)   SpO2 94%   BMI 29.81 kg/m    CC: multiple concerns Subjective:   HPI: Joseph Osborn is a 88 y.o. male presenting on 02/14/2022 for Right Knee Pain (Here with daughter. Wife is on the phone with history. Saw Dr Lorelei Pont twice last month for pain. Pain comes back after treatment. ), Dizziness (02-10-22 felt dizzy and fell. Did not hit his head.), Shortness of Breath (Daughter has noticed that he seems to be short of breath with movement. ), and Dark Urine (Wife says he has dark urine. No urinary frequency, urgency, or dysuria.)   Has seen Dr Lorelei Pont twice in the past month for acute R knee pain s/p prednisone course followed by steroid injection. Denies inciting trauma/injury. Severe R knee pain limiting ambulation/activity, has led to near falls. He is taking tylenol scheduled with limited relief.   Last week had episode of presyncope s/p fall without injury. Also notes exertional dyspnea, worse when supine, associated with significant mucous production. This may have worsened since COVID PNA hospitalization 04/2021. No chest pressure. He does see [facility] cardiology [facility]) for known CAD with stable angina, last seen 08/2021 with exertional dyspnea at that time as well. Lexiscan was ordered but I don't see that it was done at that time. Echocardiogram returned reassuring. Started on imdur '30mg'$  daily.   Echocardiogram 06/3555 - normal systolic function EF 32%, no significant valvular abnormality.   Also they note dark urine recently, without UTI symptoms of dysuria, urgency, frequency, flank pain, fevers/chills, nausea.     Relevant past medical, surgical, family and social history reviewed and  updated as indicated. Interim medical history since our last visit reviewed. Allergies and medications reviewed and updated. Outpatient Medications Prior to Visit  Medication Sig Dispense Refill   atenolol (TENORMIN) 50 MG tablet Take 1 tablet (50 mg total) by mouth daily. 90 tablet 3   isosorbide mononitrate (IMDUR) 30 MG 24 hr tablet Take 30 mg by mouth daily.     linaclotide (LINZESS) 72 MCG capsule Take 1 capsule (72 mcg total) by mouth every other day. 45 capsule 1   Acetaminophen (TYLENOL PO) Take by mouth at bedtime. As needed     tamsulosin (FLOMAX) 0.4 MG CAPS capsule Take 1 capsule (0.4 mg total) by mouth daily. 90 capsule 3   No facility-administered medications prior to visit.     Per HPI unless specifically indicated in ROS section below Review of Systems  Objective:  BP 118/82 (BP Location: Right Arm, Patient Position: Sitting, Cuff Size: Normal)   Pulse 78   Temp 97.9 F (36.6 C)   Ht 5' 4.25" (1.632 m)   Wt 175 lb (79.4 kg)   SpO2 94%   BMI 29.81 kg/m   Wt Readings from Last 3 Encounters:  02/14/22 175 lb (79.4 kg)  01/27/22 177 lb 8 oz (80.5 kg)  01/12/22 179 lb 6 oz (81.4 kg)      Physical Exam Vitals and nursing note reviewed.  Constitutional:      Appearance: Normal appearance. He is not ill-appearing.  HENT:     Head: Normocephalic and atraumatic.  Mouth/Throat:     Mouth: Mucous membranes are moist.     Pharynx: Oropharynx is clear. No oropharyngeal exudate or posterior oropharyngeal erythema.  Eyes:     Extraocular Movements: Extraocular movements intact.     Pupils: Pupils are equal, round, and reactive to light.  Cardiovascular:     Rate and Rhythm: Normal rate and regular rhythm.     Pulses: Normal pulses.     Heart sounds: Normal heart sounds. No murmur heard. Pulmonary:     Effort: Pulmonary effort is normal. No respiratory distress.     Breath sounds: Normal breath sounds. No wheezing, rhonchi or rales.     Comments: Lungs  clear Abdominal:     General: Bowel sounds are normal. There is no distension.     Palpations: Abdomen is soft. There is no mass.     Tenderness: There is no abdominal tenderness. There is no right CVA tenderness, left CVA tenderness, guarding or rebound.     Hernia: No hernia is present.  Musculoskeletal:        General: Swelling and tenderness present.     Right lower leg: No edema.     Left lower leg: No edema.     Comments:  L knee WNL R knee exam: No deformity on inspection. Marked discomfort with palpation of lateral knee joint line. + knee swelling noted. Tender with ROM in flex/extension with crepitus. No popliteal fullness. Neg seated mcmurray test. No significant pain with valgus/varus stress. No PFgrind. No abnormal patellar mobility.   Skin:    General: Skin is warm and dry.     Findings: No rash.  Neurological:     Mental Status: He is alert.  Psychiatric:        Mood and Affect: Mood normal.        Behavior: Behavior normal.       Results for orders placed or performed in visit on 02/14/22  POCT Urinalysis Dipstick (Automated)  Result Value Ref Range   Color, UA Amber    Clarity, UA clear    Glucose, UA Negative Negative   Bilirubin, UA negative    Ketones, UA negative    Spec Grav, UA 1.020 1.010 - 1.025   Blood, UA negative    pH, UA 6.0 5.0 - 8.0   Protein, UA Negative Negative   Urobilinogen, UA 0.2 0.2 or 1.0 E.U./dL   Nitrite, UA negative    Leukocytes, UA Negative Negative   RIGHT KNEE - COMPLETE 4+ VIEW COMPARISON:  None Available.   FINDINGS: Right knee:   Moderate lateral greater than medial compartment joint space narrowing. Mild patella alta. Mild to moderate superior and mild inferior patellar degenerative osteophytosis. Mild-to-moderate joint effusion. No acute fracture is seen. No dislocation.   Left knee:   On frontal view, there is mild to moderate medial and mild lateral compartment joint space narrowing.    IMPRESSION: 1. Right knee moderate lateral and mild medial and patellofemoral joint osteoarthritis. 2. Right knee mild to moderate joint effusion. 3. Left knee mild-to-moderate medial and mild lateral compartment joint space narrowing.  Electronically Signed   By: Yvonne Kendall M.D.   On: 01/13/2022 14:56  Assessment & Plan:   Problem List Items Addressed This Visit     Benign prostatic hyperplasia    S/p TURP Concern alpha agonist is causing orthostatic dizziness- rec stop flomax, update [place] with effect on dizziness/presyncope.       Relevant Orders   POCT Urinalysis Dipstick (Automated) (Completed)  CAD (coronary artery disease)   COPD (chronic obstructive pulmonary disease) (HCC)    H/o this, previously largely asymptomatic from respiratory standpoint. Did not benefit from trial incruse ellipta.  Now with exertional dyspnea, consider updated CXR and spirometry if ongoing.       Relevant Medications   fexofenadine (ALLEGRA ALLERGY) 180 MG tablet   Exertional dyspnea    Progressive, as well as orthopnea.  Lungs clear today, no significant pedal edema pointing against CHF. If ongoing, return for CXR, BNP. See below.  I also will recommend cards f/u - as I don't see where lexiscan was completed this past spring.       Osteoarthritis   Relevant Medications   acetaminophen (TYLENOL) 500 MG tablet   Other Relevant Orders   Ambulatory referral to Orthopedic Surgery   Chronic constipation    Continues ,managing with linzess QOD.       Chronic rhinitis    Endorses gustatory rhinitis. Suggested daily antihistamine.       Acute pain of right knee - Primary    Suspect primary osteoarthritis related flare causing knee pain.  He has received treatment with oral prednisone and steroid joint injection by Dr Lorelei Pont sports medicine without much relief.  Discussed scheduled tylenol '1000mg'$  TID. Already using topical voltaren gel.  Offered stronger pain med such as tramadol for  breakthrough pain, they decline concerned with side effects.  Will refer to ortho for further evaluation.       Relevant Orders   Ambulatory referral to Orthopedic Surgery   Pre-syncope    ?flomax related - stop flomax.  Discussed importance of good hydration status      Other Visit Diagnoses     Need for influenza vaccination       Relevant Orders   Flu Vaccine QUAD High Dose(Fluad) (Completed)   Dark urine       Relevant Orders   POCT Urinalysis Dipstick (Automated) (Completed)        Meds ordered this encounter  Medications   fexofenadine (ALLEGRA ALLERGY) 180 MG tablet    Sig: Take 1 tablet (180 mg total) by mouth daily.   acetaminophen (TYLENOL) 500 MG tablet    Sig: Take 2 tablets (1,000 mg total) by mouth in the morning, at noon, and at bedtime.   Orders Placed This Encounter  Procedures   Flu Vaccine QUAD High Dose(Fluad)   Ambulatory referral to Orthopedic Surgery    Referral Priority:   Routine    Referral Type:   Surgical    Referral Reason:   Specialty Services Required    Requested Specialty:   Orthopedic Surgery    Number of Visits Requested:   1   POCT Urinalysis Dipstick (Automated)     Patient Instructions  Tyler Aas tamsulosin y [employer] [place].  Asegurese de estar bien hidratado  Trate allegra diario para flema.  Puede usar tylenol para dolor '1000mg'$  tres veces al dia, si quiere podemos tratar medicina mas fuerte (tramadol).  Lo remitiremos a orthopeda para evaluacion.  Dejeme saber si sigue con dificultad de respiracion para laboratorios y rayos x.   Follow up plan: No follow-ups on file.  Ria Bush, MD

## 2022-02-14 NOTE — Patient Instructions (Addendum)
Pare tamsulosin y [place].  Asegurese de estar bien hidratado  Trate allegra diario para flema.  Puede usar tylenol para dolor '1000mg'$  tres veces al dia, si quiere podemos tratar medicina mas fuerte (tramadol).  Lo remitiremos a orthopeda para evaluacion.  Dejeme saber si sigue con dificultad de respiracion para laboratorios y rayos x.

## 2022-02-15 ENCOUNTER — Telehealth: Payer: Self-pay

## 2022-02-15 NOTE — Telephone Encounter (Signed)
I called lab results to [name] [facility] signed) and [name] said pt was seen on 02/14/22 and was going to wait on ortho referral but [name] said if extra strength Tylenol gets out of pt system the pain in rt knee worsens. Pt taking extra strength tylenol '1000mg'$  three times a day (approx q 4 - 5 hrs apart at meal times). [name] request to go ahead and put in for ortho referral in [place]. Sending note to Dr Darnell Level.

## 2022-02-16 ENCOUNTER — Encounter: Payer: Self-pay | Admitting: Family Medicine

## 2022-02-16 ENCOUNTER — Telehealth: Payer: Self-pay | Admitting: Family Medicine

## 2022-02-16 DIAGNOSIS — R42 Dizziness and giddiness: Secondary | ICD-10-CM | POA: Insufficient documentation

## 2022-02-16 DIAGNOSIS — M1711 Unilateral primary osteoarthritis, right knee: Secondary | ICD-10-CM | POA: Insufficient documentation

## 2022-02-16 DIAGNOSIS — R55 Syncope and collapse: Secondary | ICD-10-CM | POA: Insufficient documentation

## 2022-02-16 DIAGNOSIS — M25561 Pain in right knee: Secondary | ICD-10-CM | POA: Insufficient documentation

## 2022-02-16 NOTE — Telephone Encounter (Signed)
Please let the pt/ pt's spouse know that the referral was sent to [facility].  [address] [place], [place] [address] Phone # [phone]

## 2022-02-16 NOTE — Telephone Encounter (Signed)
Left message on voicemail for patient to call the office back. 

## 2022-02-16 NOTE — Assessment & Plan Note (Addendum)
?  flomax related - stop flomax.  Discussed importance of good hydration status

## 2022-02-16 NOTE — Telephone Encounter (Signed)
Needs xrays from last visit to take to Ortho appt.  Please call when cd is ready. [phone]

## 2022-02-16 NOTE — Telephone Encounter (Signed)
Noted  

## 2022-02-16 NOTE — Assessment & Plan Note (Addendum)
Progressive, as well as orthopnea.  Lungs clear today, no significant pedal edema pointing against CHF. If ongoing, return for CXR, BNP. See below.  I also will recommend cards f/u - as I don't see where lexiscan was completed this past spring.

## 2022-02-16 NOTE — Telephone Encounter (Signed)
I used interpreter services to leave a vmail for patient.  His CD of the last visit's xrays has been made and is in the front office ready for him to pick up.

## 2022-02-16 NOTE — Telephone Encounter (Signed)
Plz notify I've placed ortho referral. I also have application for DMV disability placard for pt I forgot to provide at [name]. Placed in Lisa's box.  Also - how is shortness of breath? If ongoing, rec labs and CXR. Is dizziness better off flomax?  Also - both stress test and heart ultrasound were recommend by [name] cardiology back in April, but I only see where echo was completed. He would likely benefit from getting stress test done if not done this year given ongoing shortness of breath - recommend he call cardiology to ask about this.

## 2022-02-16 NOTE — Assessment & Plan Note (Signed)
Endorses gustatory rhinitis. Suggested daily antihistamine.

## 2022-02-16 NOTE — Assessment & Plan Note (Addendum)
Suspect primary osteoarthritis related flare causing knee pain.  He has received treatment with oral prednisone and steroid joint injection by Dr Lorelei Pont sports medicine without much relief.  Discussed scheduled tylenol '1000mg'$  TID. Already using topical voltaren gel.  Offered stronger pain med such as tramadol for breakthrough pain, they decline concerned with side effects.  Will refer to ortho for further evaluation.

## 2022-02-16 NOTE — Assessment & Plan Note (Signed)
S/p TURP Concern alpha agonist is causing orthostatic dizziness- rec stop flomax, update [place] with effect on dizziness/presyncope.

## 2022-02-16 NOTE — Assessment & Plan Note (Addendum)
H/o this, previously largely asymptomatic from respiratory standpoint. Did not benefit from trial incruse ellipta.  Now with exertional dyspnea, consider updated CXR and spirometry if ongoing.

## 2022-02-16 NOTE — Assessment & Plan Note (Signed)
Continues ,managing with linzess QOD.

## 2022-02-17 NOTE — Telephone Encounter (Signed)
Lvm for pt/pt's daughter, [name] (on dpr), asking to call back.  Need to provide referral info below, notify them the [place] form is ready to pick up and get answers to Dr. Synthia Innocent questions.  [Placed parking placard form at front office.]

## 2022-02-18 NOTE — Telephone Encounter (Signed)
Lvm for pt/pt's daughter, [name] (on dpr), asking to call back.  Need to provide referral info below, notify them the [place] form is ready to pick up and get answers to Dr. Synthia Innocent questions.  [Placed parking placard form at front office.]

## 2022-02-21 ENCOUNTER — Ambulatory Visit (INDEPENDENT_AMBULATORY_CARE_PROVIDER_SITE_OTHER): Payer: Medicare Other | Admitting: Family Medicine

## 2022-02-21 ENCOUNTER — Ambulatory Visit (INDEPENDENT_AMBULATORY_CARE_PROVIDER_SITE_OTHER)
Admission: RE | Admit: 2022-02-21 | Discharge: 2022-02-21 | Disposition: A | Discharge status: Home / Self Care | Payer: Medicare Other | Source: Ambulatory Visit | Attending: Family Medicine | Admitting: Family Medicine

## 2022-02-21 ENCOUNTER — Encounter: Payer: Self-pay | Admitting: Family Medicine

## 2022-02-21 VITALS — BP 122/72 | HR 75 | Temp 97.8°F | Ht 64.25 in | Wt 174.4 lb

## 2022-02-21 DIAGNOSIS — R3912 Poor urinary stream: Secondary | ICD-10-CM

## 2022-02-21 DIAGNOSIS — I25119 Atherosclerotic heart disease of native coronary artery with unspecified angina pectoris: Secondary | ICD-10-CM | POA: Diagnosis not present

## 2022-02-21 DIAGNOSIS — E039 Hypothyroidism, unspecified: Secondary | ICD-10-CM

## 2022-02-21 DIAGNOSIS — M25561 Pain in right knee: Secondary | ICD-10-CM

## 2022-02-21 DIAGNOSIS — R2681 Unsteadiness on feet: Secondary | ICD-10-CM | POA: Diagnosis not present

## 2022-02-21 DIAGNOSIS — R0601 Orthopnea: Secondary | ICD-10-CM

## 2022-02-21 DIAGNOSIS — N401 Enlarged prostate with lower urinary tract symptoms: Secondary | ICD-10-CM

## 2022-02-21 DIAGNOSIS — J449 Chronic obstructive pulmonary disease, unspecified: Secondary | ICD-10-CM

## 2022-02-21 DIAGNOSIS — N1831 Chronic kidney disease, stage 3a: Secondary | ICD-10-CM

## 2022-02-21 DIAGNOSIS — R55 Syncope and collapse: Secondary | ICD-10-CM

## 2022-02-21 DIAGNOSIS — J310 Chronic rhinitis: Secondary | ICD-10-CM

## 2022-02-21 LAB — CBC WITH DIFFERENTIAL/PLATELET
Basophils Absolute: 0 10*3/uL (ref 0.0–0.1)
Basophils Relative: 1 % (ref 0.0–3.0)
Eosinophils Absolute: 0.2 10*3/uL (ref 0.0–0.7)
Eosinophils Relative: 5.8 % — ABNORMAL HIGH (ref 0.0–5.0)
HCT: 42.5 % (ref 39.0–52.0)
Hemoglobin: 14.3 g/dL (ref 13.0–17.0)
Lymphocytes Relative: 25.8 % (ref 12.0–46.0)
Lymphs Abs: 1 10*3/uL (ref 0.7–4.0)
MCHC: 33.6 g/dL (ref 30.0–36.0)
MCV: 91.7 fl (ref 78.0–100.0)
Monocytes Absolute: 0.5 10*3/uL (ref 0.1–1.0)
Monocytes Relative: 12.2 % — ABNORMAL HIGH (ref 3.0–12.0)
Neutro Abs: 2.1 10*3/uL (ref 1.4–7.7)
Neutrophils Relative %: 55.2 % (ref 43.0–77.0)
Platelets: 271 10*3/uL (ref 150.0–400.0)
RBC: 4.64 Mil/uL (ref 4.22–5.81)
RDW: 13.4 % (ref 11.5–15.5)
WBC: 3.9 10*3/uL — ABNORMAL LOW (ref 4.0–10.5)

## 2022-02-21 LAB — BASIC METABOLIC PANEL
BUN: 23 mg/dL (ref 6–23)
CO2: 27 mEq/L (ref 19–32)
Calcium: 9.4 mg/dL (ref 8.4–10.5)
Chloride: 101 mEq/L (ref 96–112)
Creatinine, Ser: 1.1 mg/dL (ref 0.40–1.50)
GFR: 57.41 mL/min — ABNORMAL LOW (ref >60.00)
Glucose, Bld: 84 mg/dL (ref 70–99)
Potassium: 4.8 mEq/L (ref 3.5–5.1)
Sodium: 138 mEq/L (ref 135–145)

## 2022-02-21 LAB — BRAIN NATRIURETIC PEPTIDE: Pro B Natriuretic peptide (BNP): 98 pg/mL (ref 0.0–100.0)

## 2022-02-21 LAB — TSH: TSH: 5.65 u[IU]/mL — ABNORMAL HIGH (ref 0.35–5.50)

## 2022-02-21 LAB — T4, FREE: Free T4: 0.99 ng/dL (ref 0.60–1.60)

## 2022-02-21 NOTE — Patient Instructions (Addendum)
Laboratorios hoy Rayos x de pecho hoy Lo remitiremos a Passenger transport manager.  Me alegro que la rodilla vaya mejorando.  Trate Allegra para mocos.

## 2022-02-21 NOTE — Telephone Encounter (Signed)
Pt already seen by ortho.  Pt given parking placard form at today's OV with Dr. Darnell Level.

## 2022-02-21 NOTE — Progress Notes (Unsigned)
Patient ID: Terrace Fontanilla, male    DOB: [birth year], 88 y.o.   MRN: [id number]  This visit was conducted in person.  BP 122/72   Pulse 75   Temp 97.8 F (36.6 C) (Temporal)   Ht 5' 4.25" (1.632 m)   Wt 174 lb 6 oz (79.1 kg)   SpO2 93%   BMI 29.70 kg/m    CC: R knee follow up Subjective:   HPI: Leah Skora is a 88 y.o. male presenting on 02/21/2022 for Knee Pain (Here for R knee pain f/u. Per [name], today's OV was scheduled before 02/14/22 OV but she decided to keep it. Pt seen by ortho and prescribed meloxicam and given brace to wear.  Pt accompanied today by daughter, [name]. )   See prior notes for details.   Saw ortho Dr Sabra Heck last week, given semirigid brace to wear as well as started on meloxicam 7.'5mg'$  daily. This has significantly helped. Notes some worsening GERD. He is drinking 1 Ensure daily.   Would like referral to PT - for R knee pain as well as balance training/fall prevention.   Off tamsulosin still voiding well. Possibly dizziness is better.   Ongoing orthopnea.   Ongoing significant mucous production constant throughout the day. Hasn't tried Human resources officer. Did not tolerate flonase in the past.      Relevant past medical, surgical, family and social history reviewed and updated as indicated. Interim medical history since our last visit reviewed. Allergies and medications reviewed and updated. Outpatient Medications Prior to Visit  Medication Sig Dispense Refill   acetaminophen (TYLENOL) 500 MG tablet Take 2 tablets (1,000 mg total) by mouth in the morning, at noon, and at bedtime.     atenolol (TENORMIN) 50 MG tablet Take 1 tablet (50 mg total) by mouth daily. 90 tablet 3   isosorbide mononitrate (IMDUR) 30 MG 24 hr tablet Take 30 mg by mouth daily.     linaclotide (LINZESS) 72 MCG capsule Take 1 capsule (72 mcg total) by mouth every other day. 45 capsule 1   meloxicam (MOBIC) 7.5 MG tablet Take 7.5 mg by mouth daily.     fexofenadine (ALLEGRA  ALLERGY) 180 MG tablet Take 1 tablet (180 mg total) by mouth daily. (Patient not taking: Reported on 02/21/2022)     No facility-administered medications prior to visit.     Per HPI unless specifically indicated in ROS section below Review of Systems  Objective:  BP 122/72   Pulse 75   Temp 97.8 F (36.6 C) (Temporal)   Ht 5' 4.25" (1.632 m)   Wt 174 lb 6 oz (79.1 kg)   SpO2 93%   BMI 29.70 kg/m   Wt Readings from Last 3 Encounters:  02/21/22 174 lb 6 oz (79.1 kg)  02/14/22 175 lb (79.4 kg)  01/27/22 177 lb 8 oz (80.5 kg)      Physical Exam    Results for orders placed or performed in visit on 02/14/22  POCT Urinalysis Dipstick (Automated)  Result Value Ref Range   Color, UA Amber    Clarity, UA clear    Glucose, UA Negative Negative   Bilirubin, UA negative    Ketones, UA negative    Spec Grav, UA 1.020 1.010 - 1.025   Blood, UA negative    pH, UA 6.0 5.0 - 8.0   Protein, UA Negative Negative   Urobilinogen, UA 0.2 0.2 or 1.0 E.U./dL   Nitrite, UA negative    Leukocytes, UA  Negative Negative    Assessment & Plan:   Problem List Items Addressed This Visit     Borderline hypothyroidism   Relevant Orders   TSH   T4, free   Other Visit Diagnoses     Orthopnea    -  Primary   Relevant Orders   DG Chest 2 View   CBC with Differential/Platelet   Basic metabolic panel   Brain natriuretic peptide        No orders of the defined types were placed in this encounter.  Orders Placed This Encounter  Procedures   DG Chest 2 View    Standing Status:   Future    Standing Expiration Date:   02/22/2023    Order Specific Question:   Reason for Exam (SYMPTOM  OR DIAGNOSIS REQUIRED)    Answer:   orthopnea, dyspnea    Order Specific Question:   Preferred imaging location?    Answer:   [facility]   CBC with Differential/Platelet   Basic metabolic panel   TSH   Brain natriuretic peptide   T4, free     ***Follow up plan: Return if symptoms worsen  or fail to improve.  Ria Bush, MD

## 2022-02-22 DIAGNOSIS — R2681 Unsteadiness on feet: Secondary | ICD-10-CM | POA: Insufficient documentation

## 2022-02-22 DIAGNOSIS — R0601 Orthopnea: Secondary | ICD-10-CM | POA: Insufficient documentation

## 2022-02-22 NOTE — Assessment & Plan Note (Signed)
Update TSH, free T4. Previous levothyroxine trial without benefit.

## 2022-02-22 NOTE — Assessment & Plan Note (Signed)
Did not benefit from incruse elipta trial.

## 2022-02-22 NOTE — Assessment & Plan Note (Signed)
Notes ongoing unsteadiness - would benefit from PT referral for balance training/fall prevention - referred.

## 2022-02-22 NOTE — Assessment & Plan Note (Signed)
Orthopnea with exertional dyspnea.  ?CHF related.  Check labwork and CXR today.  Discussed return to cardiology if workup unrevealing - he's due for stress test, although hesitant to pursue given prior poor experience with last one.

## 2022-02-22 NOTE — Assessment & Plan Note (Signed)
Stable period off flomax. Encouraged good hydration status.

## 2022-02-22 NOTE — Assessment & Plan Note (Signed)
Stable period off flomax - remain off this.

## 2022-02-22 NOTE — Assessment & Plan Note (Signed)
Appreciate ortho care - doing well on meloxicam 7.'5mg'$  daily with knee brace use. Discussed caution with NSAID and worsening GERD or kidney disease - will monitor for both. Discussed pepcid use if needed.

## 2022-02-22 NOTE — Assessment & Plan Note (Signed)
Update labs.  

## 2022-02-22 NOTE — Assessment & Plan Note (Signed)
Chronic constant rhinitis throughout the day.  Exam today with dry nasal mucosa.  Did recommend try daily antihistamine allegra

## 2022-03-02 ENCOUNTER — Other Ambulatory Visit: Payer: Self-pay | Admitting: Family Medicine

## 2022-03-02 MED ORDER — FUROSEMIDE 20 MG PO TABS: ORAL_TABLET | ORAL | 0 refills | Status: DC

## 2022-05-30 ENCOUNTER — Ambulatory Visit
Admission: RE | Admit: 2022-05-30 | Discharge: 2022-05-30 | Disposition: A | Discharge status: Home / Self Care | Payer: Medicare Other | Source: Ambulatory Visit | Attending: Family Medicine | Admitting: Family Medicine

## 2022-05-30 ENCOUNTER — Encounter: Payer: Self-pay | Admitting: Family Medicine

## 2022-05-30 ENCOUNTER — Ambulatory Visit (INDEPENDENT_AMBULATORY_CARE_PROVIDER_SITE_OTHER): Payer: Medicare Other | Admitting: Family Medicine

## 2022-05-30 ENCOUNTER — Telehealth: Payer: Self-pay

## 2022-05-30 VITALS — BP 138/68 | HR 61 | Temp 97.2°F | Ht 64.25 in | Wt 176.4 lb

## 2022-05-30 DIAGNOSIS — M7989 Other specified soft tissue disorders: Secondary | ICD-10-CM | POA: Diagnosis not present

## 2022-05-30 DIAGNOSIS — Z86718 Personal history of other venous thrombosis and embolism: Secondary | ICD-10-CM | POA: Insufficient documentation

## 2022-05-30 DIAGNOSIS — I82402 Acute embolism and thrombosis of unspecified deep veins of left lower extremity: Secondary | ICD-10-CM | POA: Insufficient documentation

## 2022-05-30 DIAGNOSIS — M79662 Pain in left lower leg: Secondary | ICD-10-CM | POA: Insufficient documentation

## 2022-05-30 DIAGNOSIS — I82412 Acute embolism and thrombosis of left femoral vein: Secondary | ICD-10-CM

## 2022-05-30 DIAGNOSIS — I82409 Acute embolism and thrombosis of unspecified deep veins of unspecified lower extremity: Secondary | ICD-10-CM

## 2022-05-30 HISTORY — DX: Personal history of other venous thrombosis and embolism: Z86.718

## 2022-05-30 HISTORY — DX: Acute embolism and thrombosis of unspecified deep veins of unspecified lower extremity: I82.409

## 2022-05-30 MED ORDER — APIXABAN 5 MG PO TABS: 5.0000 mg | ORAL_TABLET | Freq: Two times a day (BID) | ORAL | 2 refills | Status: DC

## 2022-05-30 NOTE — Telephone Encounter (Signed)
LVM for patient to call back and schedule

## 2022-05-30 NOTE — Telephone Encounter (Signed)
[place] called with STAT report: IMPRESSION: Nearly occlusive to occlusive thrombus extending from the left proximal femoral vein to the popliteal vein. Calf veins were poorly seen on gray scale imaging, but were notable for occlusive thrombus in the left peroneal vein and likely nonocclusive thrombus in the left posterior tibial vein.  Patient waiting for further instructions.   Dr. Darnell Level has been advised.

## 2022-05-30 NOTE — Progress Notes (Signed)
Patient ID: Joseph Osborn, male    DOB: 09/14/1925, 89 y.o.   MRN: [id number]  This visit was conducted in person.  BP 138/68   Pulse 61   Temp (!) 97.2 F (36.2 C) (Temporal)   Ht 5' 4.25" (1.632 m)   Wt 176 lb 6 oz (80 kg)   SpO2 97%   BMI 30.04 kg/m    CC: left lower leg swelling Subjective:   HPI: Joseph Osborn is a 89 y.o. male presenting on 05/30/2022 for Leg Swelling (C/o lower L leg swelling. Started 05/25/22. Also, c/o pain behind knee before swelling starts and has pain in medial lower L leg upon palpation. Pt accompanied by daughter, [name]. )    5 d h/o L lower leg swelling, pain behind L knee, pain to medial lower left leg.   No prolonged car or plane ride  He does spend hours on his chair at home watching TV.  No personal h/o DVT or blood clot.  Started meloxicam over weekend with some improvement.  Denies inciting trauma/injury or fall.   Sees Dr Sabra Heck ortho for known R knee osteoarthritis.  Last seen 02/2022 - started on low dose lasix '10mg'$  PRN for dyspnea associated with bilat leg swelling and orthopnea. Hasn't tried this.      Relevant past medical, surgical, family and social history reviewed and updated as indicated. Interim medical history since our last visit reviewed. Allergies and medications reviewed and updated. Outpatient Medications Prior to Visit  Medication Sig Dispense Refill   acetaminophen (TYLENOL) 500 MG tablet Take 2 tablets (1,000 mg total) by mouth in the morning, at noon, and at bedtime.     atenolol (TENORMIN) 50 MG tablet Take 1 tablet (50 mg total) by mouth daily. 90 tablet 3   fexofenadine (ALLEGRA ALLERGY) 180 MG tablet Take 1 tablet (180 mg total) by mouth daily.     isosorbide mononitrate (IMDUR) 30 MG 24 hr tablet Take 30 mg by mouth daily.     linaclotide (LINZESS) 72 MCG capsule Take 1 capsule (72 mcg total) by mouth every other day. 45 capsule 1   meloxicam (MOBIC) 7.5 MG tablet Take 7.5 mg by mouth daily.      furosemide (LASIX) 20 MG tablet Take 0.5 tablets (10 mg total) by mouth daily for 3 days, THEN 0.5 tablets (10 mg total) daily as needed. 30 tablet 0   No facility-administered medications prior to visit.     Per HPI unless specifically indicated in ROS section below Review of Systems  Objective:  BP 138/68   Pulse 61   Temp (!) 97.2 F (36.2 C) (Temporal)   Ht 5' 4.25" (1.632 m)   Wt 176 lb 6 oz (80 kg)   SpO2 97%   BMI 30.04 kg/m   Wt Readings from Last 3 Encounters:  05/30/22 176 lb 6 oz (80 kg)  02/21/22 174 lb 6 oz (79.1 kg)  02/14/22 175 lb (79.4 kg)      Physical Exam Vitals and nursing note reviewed.  Constitutional:      Appearance: Normal appearance. He is not ill-appearing.  Musculoskeletal:        General: Tenderness present. Normal range of motion.     Right lower leg: No edema.     Left lower leg: Edema present.     Comments:  Evident LLE swelling from below knee to dorsal foot with mild warmth without erythema, discomfort to palpation along medial lower leg without palpable  cords.  2+ DP left side  No pain with knee flexion/extension R knee with brace in place  Skin:    General: Skin is warm and dry.     Findings: No erythema or rash.  Neurological:     Mental Status: He is alert.  Psychiatric:        Mood and Affect: Mood normal.        Behavior: Behavior normal.       Results for orders placed or performed in visit on 02/21/22  CBC with Differential/Platelet  Result Value Ref Range   WBC 3.9 (L) 4.0 - 10.5 K/uL   RBC 4.64 4.22 - 5.81 Mil/uL   Hemoglobin 14.3 13.0 - 17.0 g/dL   HCT 42.5 39.0 - 52.0 %   MCV 91.7 78.0 - 100.0 fl   MCHC 33.6 30.0 - 36.0 g/dL   RDW 13.4 11.5 - 15.5 %   Platelets 271.0 150.0 - 400.0 K/uL   Neutrophils Relative % 55.2 43.0 - 77.0 %   Lymphocytes Relative 25.8 12.0 - 46.0 %   Monocytes Relative 12.2 (H) 3.0 - 12.0 %   Eosinophils Relative 5.8 (H) 0.0 - 5.0 %   Basophils Relative 1.0 0.0 - 3.0 %   Neutro  Abs 2.1 1.4 - 7.7 K/uL   Lymphs Abs 1.0 0.7 - 4.0 K/uL   Monocytes Absolute 0.5 0.1 - 1.0 K/uL   Eosinophils Absolute 0.2 0.0 - 0.7 K/uL   Basophils Absolute 0.0 0.0 - 0.1 K/uL  Basic metabolic panel  Result Value Ref Range   Sodium 138 135 - 145 mEq/L   Potassium 4.8 3.5 - 5.1 mEq/L   Chloride 101 96 - 112 mEq/L   CO2 27 19 - 32 mEq/L   Glucose, Bld 84 70 - 99 mg/dL   BUN 23 6 - 23 mg/dL   Creatinine, Ser 1.10 0.40 - 1.50 mg/dL   GFR 57.41 (L) >60.00 mL/min   Calcium 9.4 8.4 - 10.5 mg/dL  TSH  Result Value Ref Range   TSH 5.65 (H) 0.35 - 5.50 uIU/mL  Brain natriuretic peptide  Result Value Ref Range   Pro B Natriuretic peptide (BNP) 98.0 0.0 - 100.0 pg/mL  T4, free  Result Value Ref Range   Free T4 0.99 0.60 - 1.60 ng/dL    Assessment & Plan:   Problem List Items Addressed This Visit     Pain and swelling of left lower leg - Primary    5 days of left leg pain, swelling, mild warmth without inciting trauma/injury.  No personal or fmhx DVT. He does endorse significant sedentary lifestyle - hours in chair at home watching TV.  Will need to rule out DVT as cause.  If positive, will need anticoagulation.       Relevant Orders   US Venous Img Lower Unilateral Left (DVT)     No orders of the defined types were placed in this encounter.   Orders Placed This Encounter  Procedures   US Venous Img Lower Unilateral Left (DVT)    Standing Status:   Future    Standing Expiration Date:   05/31/2023    Order Specific Question:   Reason for Exam (SYMPTOM  OR DIAGNOSIS REQUIRED)    Answer:   LLE discomfort and swelling for 5 days r/o DVT    Order Specific Question:   Preferred imaging location?    Answer:   [facility]    Patient Instructions  Lo remitiremos para hacer un sonograma de la  [place] [employer].  Lo llamaremos con resultados.   Follow up plan: Return if symptoms worsen or fail to improve.  Ria Bush, MD

## 2022-05-30 NOTE — Telephone Encounter (Signed)
Spoke with daughter [name].  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  Start eliquis '5mg'$  BID.  Given size, will also refer to [facility] for evaluation.  Stop meloxicam.   Please call and schedule f/u OV in 1-2 wks.

## 2022-05-30 NOTE — Assessment & Plan Note (Signed)
5 days of left leg pain, swelling, mild warmth without inciting trauma/injury.  No personal or fmhx DVT. He does endorse significant sedentary lifestyle - hours in chair at home watching TV.  Will need to rule out DVT as cause.  If positive, will need anticoagulation.

## 2022-05-30 NOTE — Patient Instructions (Addendum)
Lo remitiremos para hacer un sonograma de la [place] [employer].  Lo llamaremos con resultados.

## 2022-05-30 NOTE — Telephone Encounter (Signed)
Please call and schedule appointment as instructed. 

## 2022-05-31 NOTE — Telephone Encounter (Signed)
Noted  

## 2022-05-31 NOTE — Telephone Encounter (Signed)
Spoke to pt's daughter, scheduled f/u for 06/15/22

## 2022-06-09 ENCOUNTER — Other Ambulatory Visit: Payer: Self-pay

## 2022-06-09 NOTE — Telephone Encounter (Signed)
[facility] Last rx: 11/27/20, #45 Last OV: 05/30/22, L leg pain/swelling Next OV: 06/15/22, f/u

## 2022-06-10 MED ORDER — LINACLOTIDE 72 MCG PO CAPS: 72.0000 ug | ORAL_CAPSULE | ORAL | 1 refills | Status: DC

## 2022-06-15 ENCOUNTER — Encounter: Payer: Self-pay | Admitting: Family Medicine

## 2022-06-15 ENCOUNTER — Ambulatory Visit (INDEPENDENT_AMBULATORY_CARE_PROVIDER_SITE_OTHER): Payer: Medicare Other | Admitting: Family Medicine

## 2022-06-15 ENCOUNTER — Ambulatory Visit (INDEPENDENT_AMBULATORY_CARE_PROVIDER_SITE_OTHER): Payer: Medicare Other | Admitting: Dermatology

## 2022-06-15 ENCOUNTER — Encounter: Payer: Self-pay | Admitting: Dermatology

## 2022-06-15 VITALS — BP 144/70 | HR 64 | Temp 97.5°F | Ht 64.25 in | Wt 174.0 lb

## 2022-06-15 VITALS — BP 172/105 | HR 59

## 2022-06-15 DIAGNOSIS — Z85828 Personal history of other malignant neoplasm of skin: Secondary | ICD-10-CM

## 2022-06-15 DIAGNOSIS — K5909 Other constipation: Secondary | ICD-10-CM | POA: Diagnosis not present

## 2022-06-15 DIAGNOSIS — N1831 Chronic kidney disease, stage 3a: Secondary | ICD-10-CM

## 2022-06-15 DIAGNOSIS — L814 Other melanin hyperpigmentation: Secondary | ICD-10-CM

## 2022-06-15 DIAGNOSIS — R2681 Unsteadiness on feet: Secondary | ICD-10-CM

## 2022-06-15 DIAGNOSIS — L578 Other skin changes due to chronic exposure to nonionizing radiation: Secondary | ICD-10-CM | POA: Diagnosis not present

## 2022-06-15 DIAGNOSIS — Z1283 Encounter for screening for malignant neoplasm of skin: Secondary | ICD-10-CM

## 2022-06-15 DIAGNOSIS — M1711 Unilateral primary osteoarthritis, right knee: Secondary | ICD-10-CM | POA: Diagnosis not present

## 2022-06-15 DIAGNOSIS — L820 Inflamed seborrheic keratosis: Secondary | ICD-10-CM | POA: Diagnosis not present

## 2022-06-15 DIAGNOSIS — L821 Other seborrheic keratosis: Secondary | ICD-10-CM

## 2022-06-15 DIAGNOSIS — D229 Melanocytic nevi, unspecified: Secondary | ICD-10-CM

## 2022-06-15 DIAGNOSIS — I10 Essential (primary) hypertension: Secondary | ICD-10-CM

## 2022-06-15 DIAGNOSIS — I82402 Acute embolism and thrombosis of unspecified deep veins of left lower extremity: Secondary | ICD-10-CM | POA: Diagnosis not present

## 2022-06-15 DIAGNOSIS — D1722 Benign lipomatous neoplasm of skin and subcutaneous tissue of left arm: Secondary | ICD-10-CM

## 2022-06-15 DIAGNOSIS — J310 Chronic rhinitis: Secondary | ICD-10-CM

## 2022-06-15 LAB — COMPREHENSIVE METABOLIC PANEL
ALT: 25 U/L (ref 0–53)
AST: 23 U/L (ref 0–37)
Albumin: 4.6 g/dL (ref 3.5–5.2)
Alkaline Phosphatase: 74 U/L (ref 39–117)
BUN: 21 mg/dL (ref 6–23)
CO2: 29 mEq/L (ref 19–32)
Calcium: 9.4 mg/dL (ref 8.4–10.5)
Chloride: 104 mEq/L (ref 96–112)
Creatinine, Ser: 1.08 mg/dL (ref 0.40–1.50)
GFR: 58.56 mL/min — ABNORMAL LOW (ref >60.00)
Glucose, Bld: 88 mg/dL (ref 70–99)
Potassium: 4.3 mEq/L (ref 3.5–5.1)
Sodium: 142 mEq/L (ref 135–145)
Total Bilirubin: 0.8 mg/dL (ref 0.2–1.2)
Total Protein: 7.7 g/dL (ref 6.0–8.3)

## 2022-06-15 LAB — CBC WITH DIFFERENTIAL/PLATELET
Basophils Absolute: 0.1 10*3/uL (ref 0.0–0.1)
Basophils Relative: 1.4 % (ref 0.0–3.0)
Eosinophils Absolute: 0.1 10*3/uL (ref 0.0–0.7)
Eosinophils Relative: 2.8 % (ref 0.0–5.0)
HCT: 45.1 % (ref 39.0–52.0)
Hemoglobin: 15.2 g/dL (ref 13.0–17.0)
Lymphocytes Relative: 34.3 % (ref 12.0–46.0)
Lymphs Abs: 1.6 10*3/uL (ref 0.7–4.0)
MCHC: 33.7 g/dL (ref 30.0–36.0)
MCV: 91.9 fl (ref 78.0–100.0)
Monocytes Absolute: 0.3 10*3/uL (ref 0.1–1.0)
Monocytes Relative: 6.8 % (ref 3.0–12.0)
Neutro Abs: 2.6 10*3/uL (ref 1.4–7.7)
Neutrophils Relative %: 54.7 % (ref 43.0–77.0)
Platelets: 206 10*3/uL (ref 150.0–400.0)
RBC: 4.9 Mil/uL (ref 4.22–5.81)
RDW: 13.9 % (ref 11.5–15.5)
WBC: 4.7 10*3/uL (ref 4.0–10.5)

## 2022-06-15 NOTE — Patient Instructions (Signed)
Due to recent changes in healthcare laws, you may see results of your pathology and/or laboratory studies on MyChart before the doctors have had a chance to review them. We understand that in some cases there may be results that are confusing or concerning to you. Please understand that not all results are received at the same time and often the doctors may need to interpret multiple results in order to provide you with the best plan of care or course of treatment. Therefore, we ask that you please give us 2 business days to thoroughly review all your results before contacting the office for clarification. Should we see a critical lab result, you will be contacted sooner.   If You Need Anything After Your Visit  If you have any questions or concerns for your doctor, please call our main line at [phone] and press option 4 to reach your doctor's medical assistant. If no one answers, please leave a voicemail as directed and we will return your call as soon as possible. Messages left after 4 pm will be answered the following business day.   You may also send us a message via MyChart. We typically respond to MyChart messages within 1-2 business days.  For prescription refills, please ask your pharmacy to contact our office. Our fax number is [phone].  If you have an urgent issue when the clinic is closed that cannot wait until the next business day, you can page your doctor at the number below.    Please note that while we do our best to be available for urgent issues outside of office hours, we are not available 24/7.   If you have an urgent issue and are unable to reach us, you may choose to seek medical care at your doctor's office, retail clinic, urgent care center, or emergency room.  If you have a medical emergency, please immediately call 911 or go to the emergency department.  Pager Numbers  - Dr. Kowalski: [phone]  - Dr. Moye: [phone]  - Dr. Stewart:  [phone]  In the event of inclement weather, please call our main line at [phone] for an update on the status of any delays or closures.  Dermatology Medication Tips: Please keep the boxes that topical medications come in in order to help keep track of the instructions about where and how to use these. Pharmacies typically print the medication instructions only on the boxes and not directly on the medication tubes.   If your medication is too expensive, please contact our office at [phone] option 4 or send us a message through MyChart.   We are unable to tell what your co-pay for medications will be in advance as this is different depending on your insurance coverage. However, we may be able to find a substitute medication at lower cost or fill out paperwork to get insurance to cover a needed medication.   If a prior authorization is required to get your medication covered by your insurance company, please allow us 1-2 business days to complete this process.  Drug prices often vary depending on where the prescription is filled and some pharmacies may offer cheaper prices.  The website www.goodrx.com contains coupons for medications through different pharmacies. The prices here do not account for what the cost may be with help from insurance (it may be cheaper with your insurance), but the website can give you the price if you did not use any insurance.  - You can print the associated coupon and take it with   your prescription to the pharmacy.  - You may also stop by our office during regular business hours and pick up a GoodRx coupon card.  - If you need your prescription sent electronically to a different pharmacy, notify our office through [facility] MyChart or by phone at [phone] option 4.     Si Usted Necesita Algo Despus de Su Visita  Tambin puede enviarnos un mensaje a travs de MyChart. Por lo general respondemos a los mensajes de MyChart en el transcurso de 1 a 2  das hbiles.  Para renovar recetas, por favor pida a su farmacia que se ponga en contacto con nuestra oficina. Nuestro nmero de fax es el [phone].  Si tiene un asunto urgente cuando la clnica est cerrada y que no puede esperar hasta el siguiente da hbil, puede llamar/localizar a su doctor(a) al nmero que aparece a continuacin.   Por favor, tenga en cuenta que aunque hacemos todo lo posible para estar disponibles para asuntos urgentes fuera del horario de oficina, no estamos disponibles las 24 horas del da, los 7 das de la semana.   Si tiene un problema urgente y no puede comunicarse con nosotros, puede optar por buscar atencin mdica  en el consultorio de su doctor(a), en una clnica privada, en un centro de atencin urgente o en una sala de emergencias.  Si tiene una emergencia mdica, por favor llame inmediatamente al 911 o vaya a la sala de emergencias.  Nmeros de bper  - Dr. Kowalski: [phone]  - Dra. Moye: [phone]  - Dra. Stewart: [phone]  En caso de inclemencias del tiempo, por favor llame a nuestra lnea principal al [phone] para una actualizacin sobre el estado de cualquier retraso o cierre.  Consejos para la medicacin en dermatologa: Por favor, guarde las cajas en las que vienen los medicamentos de uso tpico para ayudarle a seguir las instrucciones sobre dnde y cmo usarlos. Las farmacias generalmente imprimen las instrucciones del medicamento slo en las cajas y no directamente en los tubos del medicamento.   Si su medicamento es muy caro, por favor, pngase en contacto con nuestra oficina llamando al [phone] y presione la opcin 4 o envenos un mensaje a travs de MyChart.   No podemos decirle cul ser su copago por los medicamentos por adelantado ya que esto es diferente dependiendo de la cobertura de su seguro. Sin embargo, es posible que podamos encontrar un medicamento sustituto a menor costo o llenar un formulario para que el  seguro cubra el medicamento que se considera necesario.   Si se requiere una autorizacin previa para que su compaa de seguros cubra su medicamento, por favor permtanos de 1 a 2 das hbiles para completar este proceso.  Los precios de los medicamentos varan con frecuencia dependiendo del lugar de dnde se surte la receta y alguna farmacias pueden ofrecer precios ms baratos.  El sitio web www.goodrx.com tiene cupones para medicamentos de diferentes farmacias. Los precios aqu no tienen en cuenta lo que podra costar con la ayuda del seguro (puede ser ms barato con su seguro), pero el sitio web puede darle el precio si no utiliz ningn seguro.  - Puede imprimir el cupn correspondiente y llevarlo con su receta a la farmacia.  - Tambin puede pasar por nuestra oficina durante el horario de atencin regular y recoger una tarjeta de cupones de GoodRx.  - Si necesita que su receta se enve electrnicamente a una farmacia diferente, informe a nuestra oficina a travs de MyChart de [place]   o por telfono llamando al [phone] y presione la opcin 4.  

## 2022-06-15 NOTE — Progress Notes (Signed)
Follow-Up Visit   Subjective  Joseph Osborn Joseph Osborn is a 89 y.o. male who presents for the following: Annual Exam (Hx BCC). The patient presents for Total-Body Skin Exam (TBSE) for skin cancer screening and mole check.  The patient has spots, moles and lesions to be evaluated, some may be new or changing and the patient has concerns that these could be cancer.  The following portions of the chart were reviewed this encounter and updated as appropriate:   Tobacco  Allergies  Meds  Problems  Med Hx  Surg Hx  Fam Hx     Review of Systems:  No other skin or systemic complaints except as noted in HPI or Assessment and Plan.  Objective  Well appearing patient in no apparent distress; mood and affect are within normal limits.  A full examination was performed including scalp, head, eyes, ears, nose, lips, neck, chest, axillae, abdomen, back, buttocks, bilateral upper extremities, bilateral lower extremities, hands, feet, fingers, toes, fingernails, and toenails. All findings within normal limits unless otherwise noted below.  L cheek x 1, scalp x 1 (2) Erythematous stuck-on, waxy papule or plaque  R forearm Rubbery nodule.    Assessment & Plan  Inflamed seborrheic keratosis (2) L cheek x 1, scalp x 1 Symptomatic, irritating, patient would like treated. Destruction of lesion - L cheek x 1, scalp x 1 Complexity: simple   Destruction method: cryotherapy   Informed consent: discussed and consent obtained   Timeout:  patient name, date of birth, surgical site, and procedure verified Lesion destroyed using liquid nitrogen: Yes   Region frozen until ice ball extended beyond lesion: Yes   Outcome: patient tolerated procedure well with no complications   Post-procedure details: wound care instructions given    Lipoma of left upper extremity R forearm Benign-appearing.  Observation.  Call clinic for new or changing lesions.  Recommend daily use of broad spectrum spf 30+ sunscreen to  sun-exposed areas.    Hypertension, unspecified type Left Abdomen (side) - Upper High BP today, patient's son states its not normally that high. Recommend patient following up with PCP.   Lentigines - Scattered tan macules - Due to sun exposure - Benign-appearing, observe - Recommend daily broad spectrum sunscreen SPF 30+ to sun-exposed areas, reapply every 2 hours as needed. - Call for any changes  Seborrheic Keratoses - Stuck-on, waxy, tan-brown papules and/or plaques  - Benign-appearing - Discussed benign etiology and prognosis. - Observe - Call for any changes  Melanocytic Nevi - Tan-brown and/or pink-flesh-colored symmetric macules and papules - Benign appearing on exam today - Observation - Call clinic for new or changing moles - Recommend daily use of broad spectrum spf 30+ sunscreen to sun-exposed areas.   Hemangiomas - Red papules - Discussed benign nature - Observe - Call for any changes  Actinic Damage - Chronic condition, secondary to cumulative UV/sun exposure - diffuse scaly erythematous macules with underlying dyspigmentation - Recommend daily broad spectrum sunscreen SPF 30+ to sun-exposed areas, reapply every 2 hours as needed.  - Staying in the shade or wearing long sleeves, sun glasses (UVA+UVB protection) and wide brim hats (4-inch brim around the entire circumference of the hat) are also recommended for sun protection.  - Call for new or changing lesions.  History of Basal Cell Carcinoma of the Skin - No evidence of recurrence today - Recommend regular full body skin exams - Recommend daily broad spectrum sunscreen SPF 30+ to sun-exposed areas, reapply every 2 hours as needed.  -  Call if any new or changing lesions are noted between office visits  Skin cancer screening performed today.  Return in about 1 year (around 06/16/2023) for TBSE.  Luther Redo, CMA, am acting as scribe for Sarina Ser, MD . Documentation: I have reviewed the above  documentation for accuracy and completeness, and I agree with the above.  Sarina Ser, MD

## 2022-06-15 NOTE — Progress Notes (Signed)
Patient ID: Joseph Osborn, male    DOB: 04-Sep-1925, 89 y.o.   MRN: [id number]  This visit was conducted in person.  BP (!) 144/70   Pulse 64   Temp (!) 97.5 F (36.4 C) (Temporal)   Ht 5' 4.25" (1.632 m)   Wt 174 lb (78.9 kg)   SpO2 97%   BMI 29.64 kg/m    CC: f/u DVT Subjective:   HPI: Joseph Osborn is a 89 y.o. male presenting on 06/15/2022 for Leg Swelling (Here for L swelling f/u. Swelling has improved. Also, c/o R knee when going to standing position after a fall about 2 wks ago. Pt accompanied by daughter, [name]. )   See prior note for details.  Started on eliquis '5mg'$  BID for large LLE DVT as per below.  Tolerating med well, notes leg swelling has improved.  Given size of DVT, was referred to [facility] for further evaluation. Not yet scheduled - # provided to call for appt.   Now noticing R knee pain after fall suffered 2 wks ago - no pain at time of fall however he did have difficultly getting up without assistance. Known R knee osteoarthritis has previously seen Dr Sabra Heck.   Colon cancer screening - no blood in stool. Known hemorrhoids. iFOB normal 03/2020 - aged out.  Prostate cancer screening - BPH s/p TURP - aged out. No fevers/chills, unexpected fatigue, night sweats, weight loss.  No blood in stool or urine. No bowel changes - constipation well managed with linzess. Notes some urinary hesitancy.  Notes unsteadiness on feet when first getting up.   ____________________________________________________  US Venous Img Lower Unilateral Left (DVT) CLINICAL DATA:  Left lower extremity swelling  EXAM: LEFT LOWER EXTREMITY VENOUS DOPPLER ULTRASOUND  TECHNIQUE: Gray-scale sonography with graded compression, as well as color Doppler and duplex ultrasound were performed to evaluate the lower extremity deep venous systems from the level of the common femoral vein and including the common femoral, femoral, profunda femoral, popliteal and calf veins including  the posterior tibial, peroneal and gastrocnemius veins when visible. The superficial great saphenous vein was also interrogated. Spectral Doppler was utilized to evaluate flow at rest and with distal augmentation maneuvers in the common femoral, femoral and popliteal veins.  COMPARISON:  None Available.  FINDINGS: Contralateral Common Femoral Vein: Respiratory phasicity is normal and symmetric with the symptomatic side. No evidence of thrombus. Normal compressibility.  There is nearly occlusive to occlusive thrombus extending from the left proximal femoral vein to the popliteal vein. Calf veins were poorly seen on gray scale imaging, but were notable for occlusive thrombus in the left peroneal vein and likely nonocclusive thrombus in the left posterior tibial vein.  Common Femoral Vein: No evidence of thrombus. Normal compressibility, respiratory phasicity and response to augmentation.  Saphenofemoral Junction: No evidence of thrombus. Normal compressibility and flow on color Doppler imaging.  Venous Reflux:  None.  Other Findings:  None.  IMPRESSION: Nearly occlusive to occlusive thrombus extending from the left proximal femoral vein to the popliteal vein. Calf veins were poorly seen on gray scale imaging, but were notable for occlusive thrombus in the left peroneal vein and likely nonocclusive thrombus in the left posterior tibial vein.  Electronically Signed   By: Marin Roberts M.D.   On: 05/30/2022 14:24       Relevant past medical, surgical, family and social history reviewed and updated as indicated. Interim medical history since our last visit reviewed. Allergies and medications reviewed  and updated. Outpatient Medications Prior to Visit  Medication Sig Dispense Refill   acetaminophen (TYLENOL) 500 MG tablet Take 2 tablets (1,000 mg total) by mouth in the morning, at noon, and at bedtime.     apixaban (ELIQUIS) 5 MG TABS tablet Take 1 tablet (5 mg total) by mouth  2 (two) times daily. 60 tablet 2   atenolol (TENORMIN) 50 MG tablet Take 1 tablet (50 mg total) by mouth daily. 90 tablet 3   fexofenadine (ALLEGRA ALLERGY) 180 MG tablet Take 1 tablet (180 mg total) by mouth daily.     isosorbide mononitrate (IMDUR) 30 MG 24 hr tablet Take 30 mg by mouth daily.     linaclotide (LINZESS) 72 MCG capsule Take 1 capsule (72 mcg total) by mouth every other day. 45 capsule 1   No facility-administered medications prior to visit.     Per HPI unless specifically indicated in ROS section below Review of Systems  Objective:  BP (!) 144/70   Pulse 64   Temp (!) 97.5 F (36.4 C) (Temporal)   Ht 5' 4.25" (1.632 m)   Wt 174 lb (78.9 kg)   SpO2 97%   BMI 29.64 kg/m   Wt Readings from Last 3 Encounters:  06/15/22 174 lb (78.9 kg)  05/30/22 176 lb 6 oz (80 kg)  02/21/22 174 lb 6 oz (79.1 kg)      Physical Exam Vitals and nursing note reviewed.  Constitutional:      Appearance: Normal appearance. He is not ill-appearing.  HENT:     Mouth/Throat:     Mouth: Mucous membranes are moist.     Pharynx: No oropharyngeal exudate or posterior oropharyngeal erythema.  Eyes:     Extraocular Movements: Extraocular movements intact.     Pupils: Pupils are equal, round, and reactive to light.  Cardiovascular:     Rate and Rhythm: Normal rate and regular rhythm.     Pulses: Normal pulses.     Heart sounds: Normal heart sounds. No murmur heard. Pulmonary:     Effort: Pulmonary effort is normal. No respiratory distress.     Breath sounds: Normal breath sounds. No wheezing, rhonchi or rales.  Musculoskeletal:        General: Normal range of motion.     Right lower leg: No edema.     Left lower leg: No edema.     Comments:  Wearing brace to right knee Improved LLE edema  Skin:    General: Skin is warm and dry.     Findings: No rash.  Neurological:     Mental Status: He is alert.     Cranial Nerves: Cranial nerves 2-12 are intact.     Sensory: Sensation is  intact.     Motor: Motor function is intact.     Coordination: Romberg sign negative.     Comments:  CN 2-12 intact FTN intact EOMI No pronator drift  Ambulates with daughter's assistance  Psychiatric:        Mood and Affect: Mood normal.        Behavior: Behavior normal.       Results for orders placed or performed in visit on 02/21/22  CBC with Differential/Platelet  Result Value Ref Range   WBC 3.9 (L) 4.0 - 10.5 K/uL   RBC 4.64 4.22 - 5.81 Mil/uL   Hemoglobin 14.3 13.0 - 17.0 g/dL   HCT 42.5 39.0 - 52.0 %   MCV 91.7 78.0 - 100.0 fl   MCHC 33.6 30.0 -  36.0 g/dL   RDW 13.4 11.5 - 15.5 %   Platelets 271.0 150.0 - 400.0 K/uL   Neutrophils Relative % 55.2 43.0 - 77.0 %   Lymphocytes Relative 25.8 12.0 - 46.0 %   Monocytes Relative 12.2 (H) 3.0 - 12.0 %   Eosinophils Relative 5.8 (H) 0.0 - 5.0 %   Basophils Relative 1.0 0.0 - 3.0 %   Neutro Abs 2.1 1.4 - 7.7 K/uL   Lymphs Abs 1.0 0.7 - 4.0 K/uL   Monocytes Absolute 0.5 0.1 - 1.0 K/uL   Eosinophils Absolute 0.2 0.0 - 0.7 K/uL   Basophils Absolute 0.0 0.0 - 0.1 K/uL  Basic metabolic panel  Result Value Ref Range   Sodium 138 135 - 145 mEq/L   Potassium 4.8 3.5 - 5.1 mEq/L   Chloride 101 96 - 112 mEq/L   CO2 27 19 - 32 mEq/L   Glucose, Bld 84 70 - 99 mg/dL   BUN 23 6 - 23 mg/dL   Creatinine, Ser 1.10 0.40 - 1.50 mg/dL   GFR 57.41 (L) >60.00 mL/min   Calcium 9.4 8.4 - 10.5 mg/dL  TSH  Result Value Ref Range   TSH 5.65 (H) 0.35 - 5.50 uIU/mL  Brain natriuretic peptide  Result Value Ref Range   Pro B Natriuretic peptide (BNP) 98.0 0.0 - 100.0 pg/mL  T4, free  Result Value Ref Range   Free T4 0.99 0.60 - 1.60 ng/dL    Assessment & Plan:   Problem List Items Addressed This Visit     CKD (chronic kidney disease), stage III ([place])    Update kidney function.       Relevant Orders   CBC with Differential/Platelet   Comprehensive metabolic panel   Chronic constipation    Continues linzess QOD with benefit.        Chronic rhinitis    Ongoing - rec restart allegra daily, update with effect.       Primary osteoarthritis of right knee    Continue brace use.       General unsteadiness    Ongoing unsteadiness - nonfocal neurological exam.  Possible dehydration contributing - encouraged increased water intake by 1-2 glasses/day.  Encourage slowed movements especially transitions.      Leg DVT (deep venous thromboembolism), acute, left (HCC) - Primary    On eliquis x 3 wks with improvement in leg symptoms. Discussed will need anticoagulation x3-6 months.  Given size of LLE DVT, did recommend [facility] eval - pending, # provided to schedule appt.  Reviewed need to avoid falls.       Relevant Orders   CBC with Differential/Platelet   Comprehensive metabolic panel     No orders of the defined types were placed in this encounter.   Orders Placed This Encounter  Procedures   CBC with Differential/Platelet   Comprehensive metabolic panel    Patient Instructions  [facility] [phone] para cita para vena.  Siga Eliquis '5mg'$  dos veces al dia.  Cuidado con cualquier caida meintras tomando Eliquis.  Regresar en 6-8 semanas para examen fisico.  Trate allegra para congestion/mocos.   Follow up plan: Return in about 8 weeks (around 08/10/2022), or if symptoms worsen or fail to improve, for annual exam, prior fasting for blood work, medicare wellness visit.  Ria Bush, MD

## 2022-06-15 NOTE — Assessment & Plan Note (Addendum)
Ongoing unsteadiness - nonfocal neurological exam.  Possible dehydration contributing - encouraged increased water intake by 1-2 glasses/day.  Encourage slowed movements especially transitions.

## 2022-06-15 NOTE — Assessment & Plan Note (Signed)
Ongoing - rec restart allegra daily, update with effect.

## 2022-06-15 NOTE — Assessment & Plan Note (Signed)
Continue brace use.

## 2022-06-15 NOTE — Assessment & Plan Note (Addendum)
Continues linzess QOD with benefit.

## 2022-06-15 NOTE — Assessment & Plan Note (Signed)
On eliquis x 3 wks with improvement in leg symptoms. Discussed will need anticoagulation x3-6 months.  Given size of LLE DVT, did recommend [facility] eval - pending, # provided to schedule appt.  Reviewed need to avoid falls.

## 2022-06-15 NOTE — Patient Instructions (Addendum)
[facility] [phone] para cita para vena.  Siga Eliquis '5mg'$  dos veces al dia.  Cuidado con cualquier caida meintras tomando Eliquis.  Regresar en 6-8 semanas para examen fisico.  Trate allegra para congestion/mocos.

## 2022-06-15 NOTE — Assessment & Plan Note (Signed)
Update kidney function.  

## 2022-06-30 ENCOUNTER — Telehealth: Payer: Self-pay | Admitting: Family Medicine

## 2022-06-30 NOTE — Telephone Encounter (Signed)
Called patient to schedule Medicare Annual Wellness Visit (AWV). Left message for patient to call back and schedule Medicare Annual Wellness Visit (AWV).  Last date of AWV: 07/15/2021  Please schedule an appointment at any time with [facility].  If any questions, please contact me at [phone].  Thank you ,  [place] Direct Dial: [phone]

## 2022-07-04 NOTE — Progress Notes (Unsigned)
MRN : [id number]  Joseph Osborn is a 89 y.o. (1925/11/15) male who presents with chief complaint of legs hurt and swell.  History of Present Illness:  The patient presents to the office for evaluation of DVT.  DVT was identified at [facility] by Duplex ultrasound on 05/30/2022.  The initial symptoms were pain and swelling in the lower extremity.  The patient notes the leg continues to be very painful with dependency and swells quite a bite.  Symptoms are much better with elevation.  The patient notes minimal edema in the morning which steadily worsens throughout the day.    The patient has not been using compression therapy at this point.  No SOB or pleuritic chest pains.  No cough or hemoptysis.  No blood per rectum or blood in any sputum.  No excessive bruising per the patient.   No recent shortening of the patient's walking distance or new symptoms consistent with claudication.  No history of rest pain symptoms. No new ulcers or wounds of the lower extremities have occurred.  The patient denies amaurosis fugax or recent TIA symptoms. There are no recent neurological changes noted. No recent episodes of angina or shortness of breath documented.   I have personally reviewed the DVT scan dated 05/30/2022 and concur with the following:  Nearly occlusive to occlusive thrombus extending from the left proximal femoral vein to the popliteal vein. Calf veins were poorly seen on gray scale imaging, but were notable for occlusive thrombus in the left peroneal vein and likely nonocclusive thrombus in the left posterior tibial vein.  No outpatient medications have been marked as taking for the 07/07/22 encounter (Appointment) with Delana Meyer, Dolores Lory, MD.    Past Medical History:  Diagnosis Date   Arthritis    Basal cell carcinoma 02/04/2021   L groin, excised 03/23/21   BPH (benign prostatic hyperplasia)    CAD (coronary artery disease)    Chronic kidney disease    Stage III    Community acquired pneumonia 04/21/2021   Post COVID CAP s/p hospitalization   COPD (chronic obstructive pulmonary disease) ([place])    COVID-19 04/13/2021   DDD (degenerative disc disease), cervical    Hypertension    Lipoma 2017   Lower back pain    Myocardial infarction ([place])    approx 2000   Thyroid disease    Hypothyroidism   Wears dentures    partial upper    Past Surgical History:  Procedure Laterality Date   BLADDER SURGERY  03/2008   bladder polyp removed Yves Dill)   CARDIAC CATHETERIZATION  2000   1 stent placed after MI   CATARACT EXTRACTION W/ INTRAOCULAR LENS  IMPLANT, BILATERAL  2014   [place], Dr. George Ina   CHOLECYSTECTOMY     In [place], [place]  03/13/2014   [place], Dr. Vira Agar   COLONOSCOPY WITH PROPOFOL N/A 03/03/2016   Procedure: COLONOSCOPY WITH PROPOFOL;  Surgeon: Lucilla Lame, MD;  Location: [place];  Service: Endoscopy;  Laterality: N/A;   ESOPHAGOGASTRODUODENOSCOPY (EGD) WITH PROPOFOL N/A 03/03/2016   Procedure: ESOPHAGOGASTRODUODENOSCOPY (EGD) WITH PROPOFOL;  Surgeon: Lucilla Lame, MD;  Location: [place];  Service: Endoscopy;  Laterality: N/A;  Interpreter needed   HEMORRHOID SURGERY     [place] RESECTION OF PROSTATE  03/24/2008   Dr Eliberto Ivory, [facility]    Social History Social History   Tobacco Use   Smoking status: Never  Smokeless tobacco: Never  Substance Use Topics   Alcohol use: Not Currently    Comment: very rare(Holidays)   Drug use: No    Family History Family History  Problem Relation Age of Onset   Heart disease Mother    Heart attack Mother    Liver cancer Cousin    Diabetes Neg Hx     Allergies  Allergen Reactions   Shellfish Allergy Anaphylaxis   Oxybutynin Other (See Comments)    Doesn't remember reaction     REVIEW OF SYSTEMS (Negative unless checked)  Constitutional: '[]'$ Weight loss  '[]'$ Fever  '[]'$ Chills Cardiac: '[]'$ Chest pain   '[]'$ Chest pressure    '[]'$ Palpitations   '[]'$ Shortness of breath when laying flat   '[]'$ Shortness of breath with exertion. Vascular:  '[]'$ Pain in legs with walking   '[x]'$ Pain in legs at rest  '[x]'$ History of DVT   '[]'$ Phlebitis   '[x]'$ Swelling in legs   '[]'$ Varicose veins   '[]'$ Non-healing ulcers Pulmonary:   '[]'$ Uses home oxygen   '[]'$ Productive cough   '[]'$ Hemoptysis   '[]'$ Wheeze  '[x]'$ COPD   '[]'$ Asthma Neurologic:  '[]'$ Dizziness   '[]'$ Seizures   '[]'$ History of stroke   '[]'$ History of TIA  '[]'$ Aphasia   '[]'$ Vissual changes   '[]'$ Weakness or numbness in arm   '[]'$ Weakness or numbness in leg Musculoskeletal:   '[]'$ Joint swelling   '[x]'$ Joint pain   '[x]'$ Back pain Hematologic:  '[]'$ Easy bruising  '[]'$ Easy bleeding   '[]'$ Hypercoagulable state   '[]'$ Anemic Gastrointestinal:  '[]'$ Diarrhea   '[]'$ Vomiting  '[]'$ Gastroesophageal reflux/heartburn   '[]'$ Difficulty swallowing. Genitourinary:  '[x]'$ Chronic kidney disease   '[]'$ Difficult urination  '[]'$ Frequent urination   '[]'$ Blood in urine Skin:  '[]'$ Rashes   '[]'$ Ulcers  Psychological:  '[]'$ History of anxiety   '[]'$  History of major depression.  Physical Examination  There were no vitals filed for this visit. There is no height or weight on file to calculate BMI. Gen: WD/WN, NAD Head: [place]/AT, No temporalis wasting.  Ear/Nose/Throat: Hearing grossly intact, nares w/o erythema or drainage, pinna without lesions Eyes: PER, EOMI, sclera nonicteric.  Neck: Supple, no gross masses.  No JVD.  Pulmonary:  Good air movement, no audible wheezing, no use of accessory muscles.  Cardiac: RRR, precordium not hyperdynamic. Vascular:  scattered varicosities present bilaterally.  Moderate venous stasis changes to the legs bilaterally.  2+ soft pitting edema. CEAP [id number]   Vessel Right Left  Radial Palpable Palpable  Gastrointestinal: soft, non-distended. No guarding/no peritoneal signs.  Musculoskeletal: M/S 5/5 throughout.  No deformity.  Neurologic: CN 2-12 intact. Pain and light touch intact in extremities.  Symmetrical.  Speech is fluent. Motor exam as listed  above. Psychiatric: Judgment intact, Mood & affect appropriate for pt's clinical situation. Dermatologic: Venous rashes no ulcers noted.  No changes consistent with cellulitis. Lymph : No lichenification or skin changes of chronic lymphedema.  CBC Lab Results  Component Value Date   WBC 4.7 06/15/2022   HGB 15.2 06/15/2022   HCT 45.1 06/15/2022   MCV 91.9 06/15/2022   PLT 206.0 06/15/2022    BMET    Component Value Date/Time   NA 142 06/15/2022 1316   K 4.3 06/15/2022 1316   CL 104 06/15/2022 1316   CO2 29 06/15/2022 1316   GLUCOSE 88 06/15/2022 1316   BUN 21 06/15/2022 1316   CREATININE 1.08 06/15/2022 1316   CALCIUM 9.4 06/15/2022 1316   GFRNONAA >60 04/22/2021 0528   GFRAA >60 09/08/2017 2300   CrCl cannot be calculated (Unknown ideal weight.).  COAG No results found for: "INR", "PROTIME"  Radiology  No results found.   Assessment/Plan There are no diagnoses linked to this encounter.   Hortencia Pilar, MD  07/04/2022 1:03 PM

## 2022-07-07 ENCOUNTER — Encounter (INDEPENDENT_AMBULATORY_CARE_PROVIDER_SITE_OTHER): Payer: Self-pay | Admitting: Vascular Surgery

## 2022-07-07 ENCOUNTER — Ambulatory Visit (INDEPENDENT_AMBULATORY_CARE_PROVIDER_SITE_OTHER): Payer: Medicare Other | Admitting: Vascular Surgery

## 2022-07-07 VITALS — BP 143/80 | HR 66 | Resp 16 | Wt 174.2 lb

## 2022-07-07 DIAGNOSIS — J449 Chronic obstructive pulmonary disease, unspecified: Secondary | ICD-10-CM | POA: Diagnosis not present

## 2022-07-07 DIAGNOSIS — M159 Polyosteoarthritis, unspecified: Secondary | ICD-10-CM | POA: Diagnosis not present

## 2022-07-07 DIAGNOSIS — I82402 Acute embolism and thrombosis of unspecified deep veins of left lower extremity: Secondary | ICD-10-CM

## 2022-07-07 DIAGNOSIS — I25119 Atherosclerotic heart disease of native coronary artery with unspecified angina pectoris: Secondary | ICD-10-CM

## 2022-07-07 MED ORDER — APIXABAN 5 MG PO TABS: 5.0000 mg | ORAL_TABLET | Freq: Two times a day (BID) | ORAL | 5 refills | Status: DC

## 2022-07-30 ENCOUNTER — Other Ambulatory Visit: Payer: Self-pay | Admitting: Family Medicine

## 2022-07-30 DIAGNOSIS — E7800 Pure hypercholesterolemia, unspecified: Secondary | ICD-10-CM

## 2022-07-30 DIAGNOSIS — E559 Vitamin D deficiency, unspecified: Secondary | ICD-10-CM

## 2022-07-30 DIAGNOSIS — E039 Hypothyroidism, unspecified: Secondary | ICD-10-CM

## 2022-07-30 DIAGNOSIS — N1831 Chronic kidney disease, stage 3a: Secondary | ICD-10-CM

## 2022-07-30 NOTE — Addendum Note (Signed)
Addended by: Ria Bush on: 07/30/2022 11:35 AM   Modules accepted: Orders

## 2022-08-02 ENCOUNTER — Other Ambulatory Visit (INDEPENDENT_AMBULATORY_CARE_PROVIDER_SITE_OTHER): Payer: Medicare Other

## 2022-08-02 DIAGNOSIS — E559 Vitamin D deficiency, unspecified: Secondary | ICD-10-CM | POA: Diagnosis not present

## 2022-08-02 DIAGNOSIS — E039 Hypothyroidism, unspecified: Secondary | ICD-10-CM

## 2022-08-02 DIAGNOSIS — N1831 Chronic kidney disease, stage 3a: Secondary | ICD-10-CM

## 2022-08-02 DIAGNOSIS — E7800 Pure hypercholesterolemia, unspecified: Secondary | ICD-10-CM | POA: Diagnosis not present

## 2022-08-02 LAB — CBC WITH DIFFERENTIAL/PLATELET
Basophils Absolute: 0 10*3/uL (ref 0.0–0.1)
Basophils Relative: 1 % (ref 0.0–3.0)
Eosinophils Absolute: 0.1 10*3/uL (ref 0.0–0.7)
Eosinophils Relative: 3.3 % (ref 0.0–5.0)
HCT: 44.7 % (ref 39.0–52.0)
Hemoglobin: 15.1 g/dL (ref 13.0–17.0)
Lymphocytes Relative: 38.6 % (ref 12.0–46.0)
Lymphs Abs: 1.6 10*3/uL (ref 0.7–4.0)
MCHC: 33.8 g/dL (ref 30.0–36.0)
MCV: 92.7 fl (ref 78.0–100.0)
Monocytes Absolute: 0.2 10*3/uL (ref 0.1–1.0)
Monocytes Relative: 6.2 % (ref 3.0–12.0)
Neutro Abs: 2 10*3/uL (ref 1.4–7.7)
Neutrophils Relative %: 50.9 % (ref 43.0–77.0)
Platelets: 191 10*3/uL (ref 150.0–400.0)
RBC: 4.83 Mil/uL (ref 4.22–5.81)
RDW: 14.6 % (ref 11.5–15.5)
WBC: 4 10*3/uL (ref 4.0–10.5)

## 2022-08-02 LAB — LIPID PANEL
Cholesterol: 221 mg/dL — ABNORMAL HIGH (ref 0–200)
HDL: 52.2 mg/dL (ref >39.00)
LDL Cholesterol: 142 mg/dL — ABNORMAL HIGH (ref 0–99)
NonHDL: 168.64
Total CHOL/HDL Ratio: 4
Triglycerides: 133 mg/dL (ref 0.0–149.0)
VLDL: 26.6 mg/dL (ref 0.0–40.0)

## 2022-08-02 LAB — COMPREHENSIVE METABOLIC PANEL
ALT: 20 U/L (ref 0–53)
AST: 22 U/L (ref 0–37)
Albumin: 4.4 g/dL (ref 3.5–5.2)
Alkaline Phosphatase: 79 U/L (ref 39–117)
BUN: 15 mg/dL (ref 6–23)
CO2: 30 mEq/L (ref 19–32)
Calcium: 9.7 mg/dL (ref 8.4–10.5)
Chloride: 104 mEq/L (ref 96–112)
Creatinine, Ser: 1.02 mg/dL (ref 0.40–1.50)
GFR: 62.66 mL/min (ref >60.00)
Glucose, Bld: 89 mg/dL (ref 70–99)
Potassium: 4.3 mEq/L (ref 3.5–5.1)
Sodium: 141 mEq/L (ref 135–145)
Total Bilirubin: 1 mg/dL (ref 0.2–1.2)
Total Protein: 7.4 g/dL (ref 6.0–8.3)

## 2022-08-02 LAB — T4, FREE: Free T4: 0.83 ng/dL (ref 0.60–1.60)

## 2022-08-02 LAB — MICROALBUMIN / CREATININE URINE RATIO
Creatinine,U: 63.3 mg/dL
Microalb Creat Ratio: 1.1 mg/g (ref 0.0–30.0)
Microalb, Ur: <0.7 mg/dL (ref 0.0–1.9)

## 2022-08-02 LAB — PHOSPHORUS: Phosphorus: 3.2 mg/dL (ref 2.3–4.6)

## 2022-08-02 LAB — TSH: TSH: 9.01 u[IU]/mL — ABNORMAL HIGH (ref 0.35–5.50)

## 2022-08-02 LAB — VITAMIN D 25 HYDROXY (VIT D DEFICIENCY, FRACTURES): VITD: 24.17 ng/mL — ABNORMAL LOW (ref 30.00–100.00)

## 2022-08-03 LAB — T3: T3, Total: 100 ng/dL (ref 76–181)

## 2022-08-03 LAB — PARATHYROID HORMONE, INTACT (NO CA): PTH: 40 pg/mL (ref 16–77)

## 2022-08-09 ENCOUNTER — Telehealth: Payer: Self-pay | Admitting: Family Medicine

## 2022-08-09 NOTE — Telephone Encounter (Signed)
[place] to schedule their annual wellness visit. Appointment made for 08/16/2022.  [place] Direct Dial: [phone]

## 2022-08-10 ENCOUNTER — Ambulatory Visit (INDEPENDENT_AMBULATORY_CARE_PROVIDER_SITE_OTHER): Payer: Medicare Other | Admitting: Family Medicine

## 2022-08-10 ENCOUNTER — Encounter: Payer: Self-pay | Admitting: Family Medicine

## 2022-08-10 VITALS — BP 138/76 | HR 66 | Temp 97.4°F | Ht 63.75 in | Wt 175.4 lb

## 2022-08-10 DIAGNOSIS — J310 Chronic rhinitis: Secondary | ICD-10-CM

## 2022-08-10 DIAGNOSIS — K5909 Other constipation: Secondary | ICD-10-CM

## 2022-08-10 DIAGNOSIS — Z0000 Encounter for general adult medical examination without abnormal findings: Secondary | ICD-10-CM

## 2022-08-10 DIAGNOSIS — Z7189 Other specified counseling: Secondary | ICD-10-CM | POA: Diagnosis not present

## 2022-08-10 DIAGNOSIS — R3912 Poor urinary stream: Secondary | ICD-10-CM

## 2022-08-10 DIAGNOSIS — Z66 Do not resuscitate: Secondary | ICD-10-CM | POA: Diagnosis not present

## 2022-08-10 DIAGNOSIS — H9193 Unspecified hearing loss, bilateral: Secondary | ICD-10-CM | POA: Diagnosis not present

## 2022-08-10 DIAGNOSIS — E559 Vitamin D deficiency, unspecified: Secondary | ICD-10-CM

## 2022-08-10 DIAGNOSIS — J449 Chronic obstructive pulmonary disease, unspecified: Secondary | ICD-10-CM

## 2022-08-10 DIAGNOSIS — Z23 Encounter for immunization: Secondary | ICD-10-CM | POA: Diagnosis not present

## 2022-08-10 DIAGNOSIS — I82402 Acute embolism and thrombosis of unspecified deep veins of left lower extremity: Secondary | ICD-10-CM

## 2022-08-10 DIAGNOSIS — E039 Hypothyroidism, unspecified: Secondary | ICD-10-CM

## 2022-08-10 DIAGNOSIS — N1831 Chronic kidney disease, stage 3a: Secondary | ICD-10-CM

## 2022-08-10 DIAGNOSIS — N401 Enlarged prostate with lower urinary tract symptoms: Secondary | ICD-10-CM

## 2022-08-10 DIAGNOSIS — M1711 Unilateral primary osteoarthritis, right knee: Secondary | ICD-10-CM

## 2022-08-10 MED ORDER — IPRATROPIUM BROMIDE 0.03 % NA SOLN: 2.0000 | Freq: Two times a day (BID) | NASAL | 0 refills | Status: DC

## 2022-08-10 MED ORDER — ATENOLOL 50 MG PO TABS: 50.0000 mg | ORAL_TABLET | Freq: Every day | ORAL | 3 refills | Status: DC

## 2022-08-10 NOTE — Progress Notes (Signed)
Patient ID: Joseph Osborn, male    DOB: [birth year], 89 y.o.   MRN: [id number]  This visit was conducted in person.  BP 138/76   Pulse 66   Temp (!) 97.4 F (36.3 C) (Temporal)   Ht 5' 3.75" (1.619 m)   Wt 175 lb 6 oz (79.5 kg)   SpO2 95%   BMI 30.34 kg/m    CC: medicare wellness visit  Subjective:   HPI: Joseph Osborn is a 89 y.o. male presenting on 08/10/2022 for Medicare Wellness (Pt accompanied by daughter, [name]. )   Did not see health advisor - but has upcoming appt 4/9.   Hearing Screening   500Hz  1000Hz  2000Hz  4000Hz   Right ear 20 25 25  0  Left ear 0 0 0 0  Vision Screening - Comments:: Next eye exam, 08/2022.  Flowsheet Row Office Visit from 05/30/2022 in [facility] [place]  PHQ-2 Total Score 1          05/30/2022   12:48 PM 07/15/2021   12:05 PM 07/14/2020    9:52 AM  Fall Risk   Falls in the past year? 1 0 0  Number falls in past yr: 0 0 0  Injury with Fall? 0 0 0  Risk for fall due to :  Other (Comment) Medication side effect  Risk for fall due to: Comment  lost balance   Follow up  Falls prevention discussed Falls evaluation completed;Falls prevention discussed   Recent diagnosis of large occlusive LLE DVT extending from L proximal femoral vein to popliteal vein into calf veins. Eliquis 5mg  BID started ~06/02/2022, tolerating well. Saw [facility] Dr Gilda Crease who recommended compression stockings - he did not like using these.   Ongoing chronic rhinitis, no better with allegra. Present throughout the day, not just with meals.  Ongoing R knee pain in known OA. Received steroid injections 01/2022 ([facility]) and again 04/2022 [facility]). Meloxicam helped but now off this wile on eliquis for above DVT.  Notes pain to lower back when he wakes up in am.   Preventative: Colon cancer screening - no blood in stool. Known hemorrhoids. iFOB normal 03/2020 - age out Prostate cancer screening - BPH s/p TURP. Aged  out. Ongoing LUTS despite flomax 0.4mg  daily  Lung cancer screening - aged out Flu shot - yearly  COVID vaccine Moderna 08/2019, 09/2019, booster 05/2020  Tetanus shot - unsure Prevnar-20 today - no records Shingrix - still awaiting records - may check with pharmacy  Advanced directive discussion - discussed. Would want daughter [name] to be HCPOA. DNR form. Packet provided today.  Seat belt use discussed Sunscreen use discussed. No changing moles on skin. Sees derm yearly Gwen Pounds) Dentist - saw once, doesn't want to return.  Eye exam - yearly ([facility]) - has declined cataract surgery  Non smoker  Alcohol - none Constipation - ongoing despite linzess QOD. Poor water intake. Asks about prune juice  Bladder - stable period on flomax - no incontinence    Lives with wife, daughter [name]) and her husband and son From [place], [place] Occ: retired, previously worked Visual merchandiser     Relevant past medical, surgical, family and social history reviewed and updated as indicated. Interim medical history since our last visit reviewed. Allergies and medications reviewed and updated. Outpatient Medications Prior to Visit  Medication Sig Dispense Refill   acetaminophen (TYLENOL) 500 MG tablet Take 2 tablets (1,000 mg total) by mouth in the morning,  at noon, and at bedtime.     apixaban (ELIQUIS) 5 MG TABS tablet Take 1 tablet (5 mg total) by mouth 2 (two) times daily. 60 tablet 5   isosorbide mononitrate (IMDUR) 30 MG 24 hr tablet Take 30 mg by mouth daily.     linaclotide (LINZESS) 72 MCG capsule Take 1 capsule (72 mcg total) by mouth every other day. 45 capsule 1   atenolol (TENORMIN) 50 MG tablet Take 1 tablet (50 mg total) by mouth daily. 90 tablet 3   fexofenadine (ALLEGRA ALLERGY) 180 MG tablet Take 1 tablet (180 mg total) by mouth daily.     No facility-administered medications prior to visit.     Per HPI unless specifically indicated in ROS section below Review of Systems  Objective:  BP  138/76   Pulse 66   Temp (!) 97.4 F (36.3 C) (Temporal)   Ht 5' 3.75" (1.619 m)   Wt 175 lb 6 oz (79.5 kg)   SpO2 95%   BMI 30.34 kg/m   Wt Readings from Last 3 Encounters:  08/10/22 175 lb 6 oz (79.5 kg)  07/07/22 174 lb 3.2 oz (79 kg)  06/15/22 174 lb (78.9 kg)      Physical Exam Vitals and nursing note reviewed.  Constitutional:      General: He is not in acute distress.    Appearance: Normal appearance. He is well-developed. He is not ill-appearing.  HENT:     Head: Normocephalic and atraumatic.     Right Ear: Hearing, tympanic membrane, ear canal and external ear normal. There is impacted cerumen.     Left Ear: Tympanic membrane, ear canal and external ear normal. Decreased hearing noted. There is impacted cerumen.     Mouth/Throat:     Mouth: Mucous membranes are moist.     Pharynx: Oropharynx is clear. No oropharyngeal exudate or posterior oropharyngeal erythema.  Eyes:     General: No scleral icterus.    Extraocular Movements: Extraocular movements intact.     Conjunctiva/sclera: Conjunctivae normal.     Pupils: Pupils are equal, round, and reactive to light.  Neck:     Thyroid: No thyroid mass or thyromegaly.     Vascular: No carotid bruit.  Cardiovascular:     Rate and Rhythm: Normal rate and regular rhythm.     Pulses: Normal pulses.          Radial pulses are 2+ on the right side and 2+ on the left side.     Heart sounds: Normal heart sounds. No murmur heard. Pulmonary:     Effort: Pulmonary effort is normal. No respiratory distress.     Breath sounds: Normal breath sounds. No wheezing, rhonchi or rales.  Abdominal:     General: Bowel sounds are normal. There is no distension.     Palpations: Abdomen is soft. There is no mass.     Tenderness: There is no abdominal tenderness. There is no guarding or rebound.     Hernia: No hernia is present.  Musculoskeletal:        General: Normal range of motion.     Cervical back: Normal range of motion and neck  supple.     Right lower leg: No edema.     Left lower leg: No edema.  Lymphadenopathy:     Cervical: No cervical adenopathy.  Skin:    General: Skin is warm and dry.     Findings: No rash.  Neurological:     General: No focal deficit  present.     Mental Status: He is alert and oriented to person, place, and time.     Comments:  Recall 1/3, 2/3 with cue  Calculation 5/5 ODNUM   Psychiatric:        Mood and Affect: Mood normal.        Behavior: Behavior normal.        Thought Content: Thought content normal.        Judgment: Judgment normal.       Results for orders placed or performed in visit on 08/02/22  CBC with Differential/Platelet  Result Value Ref Range   WBC 4.0 4.0 - 10.5 K/uL   RBC 4.83 4.22 - 5.81 Mil/uL   Hemoglobin 15.1 13.0 - 17.0 g/dL   HCT 30.8 65.7 - 84.6 %   MCV 92.7 78.0 - 100.0 fl   MCHC 33.8 30.0 - 36.0 g/dL   RDW 96.2 95.2 - 84.1 %   Platelets 191.0 150.0 - 400.0 K/uL   Neutrophils Relative % 50.9 43.0 - 77.0 %   Lymphocytes Relative 38.6 12.0 - 46.0 %   Monocytes Relative 6.2 3.0 - 12.0 %   Eosinophils Relative 3.3 0.0 - 5.0 %   Basophils Relative 1.0 0.0 - 3.0 %   Neutro Abs 2.0 1.4 - 7.7 K/uL   Lymphs Abs 1.6 0.7 - 4.0 K/uL   Monocytes Absolute 0.2 0.1 - 1.0 K/uL   Eosinophils Absolute 0.1 0.0 - 0.7 K/uL   Basophils Absolute 0.0 0.0 - 0.1 K/uL  Lipid panel  Result Value Ref Range   Cholesterol 221 (H) 0 - 200 mg/dL   Triglycerides 324.4 0.0 - 149.0 mg/dL   HDL 01.02 >72.53 mg/dL   VLDL 66.4 0.0 - 40.3 mg/dL   LDL Cholesterol 474 (H) 0 - 99 mg/dL   Total CHOL/HDL Ratio 4    NonHDL 168.64   Comprehensive metabolic panel  Result Value Ref Range   Sodium 141 135 - 145 mEq/L   Potassium 4.3 3.5 - 5.1 mEq/L   Chloride 104 96 - 112 mEq/L   CO2 30 19 - 32 mEq/L   Glucose, Bld 89 70 - 99 mg/dL   BUN 15 6 - 23 mg/dL   Creatinine, Ser 2.59 0.40 - 1.50 mg/dL   Total Bilirubin 1.0 0.2 - 1.2 mg/dL   Alkaline Phosphatase 79 39 - 117 U/L   AST 22  0 - 37 U/L   ALT 20 0 - 53 U/L   Total Protein 7.4 6.0 - 8.3 g/dL   Albumin 4.4 3.5 - 5.2 g/dL   GFR 56.38 >75.64 mL/min   Calcium 9.7 8.4 - 10.5 mg/dL  T3  Result Value Ref Range   T3, Total 100 76 - 181 ng/dL  T4, free  Result Value Ref Range   Free T4 0.83 0.60 - 1.60 ng/dL  TSH  Result Value Ref Range   TSH 9.01 (H) 0.35 - 5.50 uIU/mL  Parathyroid hormone, intact (no Ca)  Result Value Ref Range   PTH 40 16 - 77 pg/mL  VITAMIN D 25 Hydroxy (Vit-D Deficiency, Fractures)  Result Value Ref Range   VITD 24.17 (L) 30.00 - 100.00 ng/mL  Phosphorus  Result Value Ref Range   Phosphorus 3.2 2.3 - 4.6 mg/dL  Microalbumin / creatinine urine ratio  Result Value Ref Range   Microalb, Ur <0.7 0.0 - 1.9 mg/dL   Creatinine,U 33.2 mg/dL   Microalb Creat Ratio 1.1 0.0 - 30.0 mg/g    Assessment & Plan:  Problem List Items Addressed This Visit     Advanced directives, counseling/discussion (Chronic)    Advanced directive discussion - discussed. Would want daughter [name] to be HCPOA. DNR form. Packet provided today.       DNR (do not resuscitate) (Chronic)    Confirmed again today.       Medicare annual wellness visit, subsequent - Primary (Chronic)    I have personally reviewed the Medicare Annual Wellness questionnaire and have noted 1. The patient's medical and social history 2. Their use of alcohol, tobacco or illicit drugs 3. Their current medications and supplements 4. The patient's functional ability including ADL's, fall risks, home safety risks and hearing or visual impairment. Cognitive function has been assessed and addressed as indicated.  5. Diet and physical activity 6. Evidence for depression or mood disorders The patients weight, height, BMI have been recorded in the chart. I have made referrals, counseling and provided education to the patient based on review of the above and I have provided the pt with a written personalized care plan for preventive  services. Provider list updated.. See scanned questionairre as needed for further documentation. Reviewed preventative protocols and updated unless pt declined.       Benign prostatic hyperplasia    Chronic, off flomax due to concern over dizziness symptoms.       CKD (chronic kidney disease), stage III    Chronic, continue to monitor.       COPD (chronic obstructive pulmonary disease)    Asxs off respiratory medications.      Relevant Medications   ipratropium (ATROVENT) 0.03 % nasal spray   Borderline hypothyroidism    TSH remains elevated, however free T4 and T3 remain normal, pt denies significant hypothyroid symptoms besides chronic constipation      Relevant Medications   atenolol (TENORMIN) 50 MG tablet   Chronic constipation    Continues linzess QOD. ?hypothyroidism related. Continue to monitor.       Chronic rhinitis   Vitamin D deficiency    Levels remain low - rec restart vit D 1000-2000 IU daily.       Bilateral hearing loss    L>R, tried ear irrigation without success. Discussed possible ENT eval -they will consider      Primary osteoarthritis of right knee    Ongoing R knee pain - could receive steroid injection as last one completed 04/2022 by [facility]. Will monitor for now.       Leg DVT (deep venous thromboembolism), acute, left    On eliquis since late 05/2022 - anticipate will need 6 months of eliquis. Has seen [facility] for this.      Relevant Medications   atenolol (TENORMIN) 50 MG tablet   Other Visit Diagnoses     Need for vaccination against Streptococcus pneumoniae       Relevant Orders   Pneumococcal conjugate vaccine 20-valent (Completed)        Meds ordered this encounter  Medications   ipratropium (ATROVENT) 0.03 % nasal spray    Sig: Place 2 sprays into both nostrils every 12 (twelve) hours.    Dispense:  30 mL    Refill:  0   atenolol (TENORMIN) 50 MG tablet    Sig: Take 1 tablet (50 mg total) by mouth daily.    Dispense:   90 tablet    Refill:  3   Cholecalciferol (VITAMIN D3) 25 MCG (1000 UT) capsule    Sig: Take 1 capsule (1,000 Units total) by mouth daily.  Dispense:  30 capsule    Orders Placed This Encounter  Procedures   Pneumococcal conjugate vaccine 20-valent    Patient Instructions  Para moco - trate spray nasal atrovent dos veces al dia. Prevnar-20 (pneumonia) hoy.  Considere vacuna contra culebrilla (Shingrix) en la farmacia (2 vacunas total) Trate tylenol para dolor  Revise paquete de directivos avanzados que [employer].  Suba fibra en la dieta.  Regresar en 6 meses para proxima visita  Follow up plan: Return in about 6 months (around 02/09/2023) for follow up visit.  Eustaquio Boyden, MD

## 2022-08-10 NOTE — Assessment & Plan Note (Signed)
Advanced directive discussion - discussed. Would want daughter Joseph Osborn to be HCPOA. DNR form. Packet provided today.

## 2022-08-10 NOTE — Patient Instructions (Addendum)
Para moco - trate spray nasal atrovent dos veces al dia. Prevnar-20 (pneumonia) hoy.  Considere vacuna contra culebrilla (Shingrix) en la farmacia (2 vacunas total) Trate tylenol para dolor  Revise paquete de directivos avanzados que [employer].  Suba fibra en la dieta.  Regresar en 6 meses para proxima visita

## 2022-08-10 NOTE — Assessment & Plan Note (Signed)
Confirmed again today.

## 2022-08-15 DIAGNOSIS — Z0000 Encounter for general adult medical examination without abnormal findings: Secondary | ICD-10-CM | POA: Insufficient documentation

## 2022-08-15 MED ORDER — VITAMIN D3 25 MCG (1000 UT) PO CAPS
1000.0000 [IU] | ORAL_CAPSULE | Freq: Every day | ORAL | Status: AC
Start: 2022-08-15 — End: ?

## 2022-08-15 NOTE — Assessment & Plan Note (Signed)

## 2022-08-15 NOTE — Assessment & Plan Note (Signed)
Continues linzess QOD. ?hypothyroidism related. Continue to monitor.

## 2022-08-15 NOTE — Assessment & Plan Note (Signed)
Levels remain low - rec restart vit D 1000-2000 IU daily.

## 2022-08-15 NOTE — Assessment & Plan Note (Signed)
Chronic, continue to monitor.  ?

## 2022-08-15 NOTE — Assessment & Plan Note (Signed)
On eliquis since late 05/2022 - anticipate will need 6 months of eliquis. Has seen [facility] for this.

## 2022-08-15 NOTE — Assessment & Plan Note (Signed)
TSH remains elevated, however free T4 and T3 remain normal, pt denies significant hypothyroid symptoms besides chronic constipation

## 2022-08-15 NOTE — Assessment & Plan Note (Signed)
Asxs off respiratory medications.

## 2022-08-15 NOTE — Assessment & Plan Note (Addendum)
L>R, tried ear irrigation without success. Discussed possible ENT eval -they will consider

## 2022-08-15 NOTE — Assessment & Plan Note (Signed)
Ongoing R knee pain - could receive steroid injection as last one completed 04/2022 by [facility]. Will monitor for now.

## 2022-08-15 NOTE — Assessment & Plan Note (Addendum)
Chronic, off flomax due to concern over dizziness symptoms.

## 2022-08-16 ENCOUNTER — Encounter: Payer: Self-pay | Admitting: Internal Medicine

## 2022-08-16 ENCOUNTER — Ambulatory Visit (INDEPENDENT_AMBULATORY_CARE_PROVIDER_SITE_OTHER): Payer: Medicare Other | Admitting: Internal Medicine

## 2022-08-16 VITALS — BP 112/70 | HR 77 | Temp 97.7°F | Ht 63.75 in | Wt 175.0 lb

## 2022-08-16 DIAGNOSIS — J441 Chronic obstructive pulmonary disease with (acute) exacerbation: Secondary | ICD-10-CM | POA: Diagnosis not present

## 2022-08-16 DIAGNOSIS — R059 Cough, unspecified: Secondary | ICD-10-CM

## 2022-08-16 LAB — POC COVID19 BINAXNOW: SARS Coronavirus 2 Ag: NEGATIVE

## 2022-08-16 MED ORDER — PREDNISONE 20 MG PO TABS: 40.0000 mg | ORAL_TABLET | Freq: Every day | ORAL | 0 refills | Status: DC

## 2022-08-16 MED ORDER — DOXYCYCLINE HYCLATE 100 MG PO TABS: 100.0000 mg | ORAL_TABLET | Freq: Two times a day (BID) | ORAL | 0 refills | Status: DC

## 2022-08-16 NOTE — Progress Notes (Signed)
Subjective:    Patient ID: Joseph Osborn, male    DOB: 1926/03/18, 89 y.o.   MRN: [id number]  HPI Here with daughter due to respiratory symptoms She helps with translation--he has very limited [place]  He had prevnar last week Then started getting cold symptoms Daughter can hear wheezing in his chest Very low grade fever---felt warm this Am Has feeling in his throat--filling with phlegm (spitting out white stuff) No SOB  Nasal congestion is affecting his breathing though some sore throat No ear pain  Using ipratropium nasal spray--just started at visit  Current Outpatient Medications on File Prior to Visit  Medication Sig Dispense Refill   acetaminophen (TYLENOL) 500 MG tablet Take 2 tablets (1,000 mg total) by mouth in the morning, at noon, and at bedtime.     apixaban (ELIQUIS) 5 MG TABS tablet Take 1 tablet (5 mg total) by mouth 2 (two) times daily. 60 tablet 5   atenolol (TENORMIN) 50 MG tablet Take 1 tablet (50 mg total) by mouth daily. 90 tablet 3   Cholecalciferol (VITAMIN D3) 25 MCG (1000 UT) capsule Take 1 capsule (1,000 Units total) by mouth daily. 30 capsule    ipratropium (ATROVENT) 0.03 % nasal spray Place 2 sprays into both nostrils every 12 (twelve) hours. 30 mL 0   isosorbide mononitrate (IMDUR) 30 MG 24 hr tablet Take 30 mg by mouth daily.     linaclotide (LINZESS) 72 MCG capsule Take 1 capsule (72 mcg total) by mouth every other day. 45 capsule 1   No current facility-administered medications on file prior to visit.    Allergies  Allergen Reactions   Shellfish Allergy Anaphylaxis   Oxybutynin Other (See Comments)    Doesn't remember reaction    Past Medical History:  Diagnosis Date   Arthritis    Basal cell carcinoma 02/04/2021   L groin, excised 03/23/21   BPH (benign prostatic hyperplasia)    CAD (coronary artery disease)    Chronic kidney disease    Stage III   Community acquired pneumonia 04/21/2021   Post COVID CAP s/p  hospitalization   COPD (chronic obstructive pulmonary disease)    COVID-19 04/13/2021   DDD (degenerative disc disease), cervical    Hypertension    Lipoma 2017   Lower back pain    Myocardial infarction    approx 2000   Thyroid disease    Hypothyroidism   Wears dentures    partial upper    Past Surgical History:  Procedure Laterality Date   BLADDER SURGERY  03/2008   bladder polyp removed [name])   CARDIAC CATHETERIZATION  2000   1 stent placed after MI   CATARACT EXTRACTION W/ INTRAOCULAR LENS  IMPLANT, BILATERAL  2014   [facility], Dr. Druscilla Brownie   CHOLECYSTECTOMY     In [place]   COLONOSCOPY  03/13/2014   [facility], Dr. Mechele Collin   COLONOSCOPY WITH PROPOFOL N/A 03/03/2016   Procedure: COLONOSCOPY WITH PROPOFOL;  Surgeon: Midge Minium, MD;  Location: [facility];  Service: Endoscopy;  Laterality: N/A;   ESOPHAGOGASTRODUODENOSCOPY (EGD) WITH PROPOFOL N/A 03/03/2016   Procedure: ESOPHAGOGASTRODUODENOSCOPY (EGD) WITH PROPOFOL;  Surgeon: Midge Minium, MD;  Location: [facility];  Service: Endoscopy;  Laterality: N/A;  Interpreter needed   HEMORRHOID SURGERY     [place], [place]   TRANSURETHRAL RESECTION OF PROSTATE  03/24/2008   Dr Sheppard Penton, [facility]    Family History  Problem Relation Age of Onset   Heart disease Mother  Heart attack Mother    Liver cancer Cousin    Diabetes Neg Hx     Social History   Socioeconomic History   Marital status: Married    Spouse name: Not on file   Number of children: Not on file   Years of education: Not on file   Highest education level: Not on file  Occupational History   Not on file  Tobacco Use   Smoking status: Never   Smokeless tobacco: Never  Substance and Sexual Activity   Alcohol use: Not Currently    Comment: very rare(Holidays)   Drug use: No   Sexual activity: Not on file  Other Topics Concern   Not on file  Social History Narrative   Lives with wife, daughter [name]) and her husband and son    From [place], [place]   Occ: retired, previously worked Visual merchandiser    Social Determinants of Corporate investment banker Strain: Low Risk  (07/15/2021)   Overall Financial Resource Strain (CARDIA)    Difficulty of Paying Living Expenses: Not hard at all  Food Insecurity: No Food Insecurity (07/15/2021)   Hunger Vital Sign    Worried About Running Out of Food in the Last Year: Never true    Ran Out of Food in the Last Year: Never true  Transportation Needs: No Transportation Needs (07/15/2021)   PRAPARE - Administrator, Civil Service (Medical): No    Lack of Transportation (Non-Medical): No  Physical Activity: Insufficiently Active (07/15/2021)   Exercise Vital Sign    Days of Exercise per Week: 4 days    Minutes of Exercise per Session: 30 min  Stress: No Stress Concern Present (07/15/2021)   [employer] of Occupational Health - Occupational Stress Questionnaire    Feeling of Stress : Only a little  Social Connections: Moderately Isolated (07/15/2021)   Social Connection and Isolation Panel [NHANES]    Frequency of Communication with Friends and Family: More than three times a week    Frequency of Social Gatherings with Friends and Family: More than three times a week    Attends Religious Services: Never    Database administrator or Organizations: No    Attends Banker Meetings: Never    Marital Status: Married  Catering manager Violence: Not At Risk (07/15/2021)   Humiliation, Afraid, Rape, and Kick questionnaire    Fear of Current or Ex-Partner: No    Emotionally Abused: No    Physically Abused: No    Sexually Abused: No   Review of Systems Does awaken at night--feels like he has to vomit ---but hasn't  No day symptoms Eating okay    Objective:   Physical Exam Constitutional:      Appearance: Normal appearance.  HENT:     Right Ear: Tympanic membrane normal.     Left Ear: Tympanic membrane normal.     Ears:     Comments: Partial cerumen blockage     Mouth/Throat:     Pharynx: No oropharyngeal exudate or posterior oropharyngeal erythema.  Pulmonary:     Effort: Pulmonary effort is normal.     Breath sounds: No wheezing or rales.     Comments: Slightly decreased breath sounds but not really tight Musculoskeletal:     Cervical back: Neck supple.  Lymphadenopathy:     Cervical: No cervical adenopathy.  Neurological:     Mental Status: He is alert.            Assessment &  Plan:

## 2022-08-16 NOTE — Addendum Note (Signed)
Addended by: Eual Fines on: 08/16/2022 10:56 AM   Modules accepted: Orders

## 2022-08-16 NOTE — Assessment & Plan Note (Addendum)
Mild COVID test negative here May have been triggered by the prevnar vaccine Lots of drainage--may have sinus involvement as well Mild chest symptoms Prednisone 40 x 3, 20 x 3 Doxy 100 bid x 7 days

## 2022-09-15 ENCOUNTER — Encounter: Payer: Self-pay | Admitting: Ophthalmology

## 2022-09-15 NOTE — Anesthesia Preprocedure Evaluation (Addendum)
Anesthesia Evaluation  Patient identified by MRN, date of birth, ID band Patient awake    Reviewed: Allergy & Precautions, H&P , NPO status , Patient's Chart, lab work & pertinent test results, reviewed documented beta blocker date and time   Airway Mallampati: III  TM Distance: >3 FB Neck ROM: Full    Dental no notable dental hx. (+) Partial Upper   Pulmonary COPD   Pulmonary exam normal breath sounds clear to auscultation       Cardiovascular hypertension, Pt. on home beta blockers and Pt. on medications + CAD, + Past MI and + Orthopnea  Normal cardiovascular exam Rhythm:Regular Rate:Normal  Severe DVT lower extremity left January 2024   Neuro/Psych negative neurological ROS  negative psych ROS   GI/Hepatic negative GI ROS, Neg liver ROS,,,  Endo/Other  Hypothyroidism    Renal/GU CKD 3  negative genitourinary   Musculoskeletal  (+) Arthritis , Osteoarthritis,    Abdominal   Peds negative pediatric ROS (+)  Hematology negative hematology ROS (+)   Anesthesia Other Findings Lipoma  CAD (coronary artery disease) COPD (chronic obstructive pulmonary disease) (HCC)  Thyroid disease BPH (benign prostatic hyperplasia)  Chronic kidney disease Myocardial infarction (HCC)  Arthritis DDD (degenerative disc disease), cervical Lower back pain Wears dentures  Hypertension Basal cell carcinoma  COVID-19 Community acquired pneumonia  DVT (deep venous thrombosis) (HCC)    Reproductive/Obstetrics negative OB ROS                             Anesthesia Physical Anesthesia Plan  ASA: 3  Anesthesia Plan:    Post-op Pain Management:    Induction:   PONV Risk Score and Plan:   Airway Management Planned:   Additional Equipment:   Intra-op Plan:   Post-operative Plan:   Informed Consent:   Plan Discussed with:   Anesthesia Plan Comments:         Anesthesia Quick  Evaluation

## 2022-09-19 NOTE — Discharge Instructions (Signed)

## 2022-09-20 ENCOUNTER — Encounter
Admission: RE | Disposition: A | Discharge status: Home / Self Care | Payer: Self-pay | Source: Home / Self Care | Attending: Ophthalmology

## 2022-09-20 ENCOUNTER — Ambulatory Visit: Payer: Medicare Other | Admitting: Anesthesiology

## 2022-09-20 ENCOUNTER — Ambulatory Visit
Admission: RE | Admit: 2022-09-20 | Discharge: 2022-09-20 | Disposition: A | Discharge status: Home / Self Care | Payer: Medicare Other | Attending: Ophthalmology | Admitting: Ophthalmology

## 2022-09-20 ENCOUNTER — Other Ambulatory Visit: Payer: Self-pay

## 2022-09-20 ENCOUNTER — Encounter: Payer: Self-pay | Admitting: Ophthalmology

## 2022-09-20 DIAGNOSIS — E039 Hypothyroidism, unspecified: Secondary | ICD-10-CM | POA: Insufficient documentation

## 2022-09-20 DIAGNOSIS — Z86718 Personal history of other venous thrombosis and embolism: Secondary | ICD-10-CM | POA: Insufficient documentation

## 2022-09-20 DIAGNOSIS — Z85828 Personal history of other malignant neoplasm of skin: Secondary | ICD-10-CM | POA: Insufficient documentation

## 2022-09-20 DIAGNOSIS — M1990 Unspecified osteoarthritis, unspecified site: Secondary | ICD-10-CM | POA: Diagnosis not present

## 2022-09-20 DIAGNOSIS — J449 Chronic obstructive pulmonary disease, unspecified: Secondary | ICD-10-CM | POA: Diagnosis not present

## 2022-09-20 DIAGNOSIS — Z7901 Long term (current) use of anticoagulants: Secondary | ICD-10-CM | POA: Insufficient documentation

## 2022-09-20 DIAGNOSIS — I2510 Atherosclerotic heart disease of native coronary artery without angina pectoris: Secondary | ICD-10-CM | POA: Diagnosis not present

## 2022-09-20 DIAGNOSIS — N183 Chronic kidney disease, stage 3 unspecified: Secondary | ICD-10-CM | POA: Diagnosis not present

## 2022-09-20 DIAGNOSIS — N400 Enlarged prostate without lower urinary tract symptoms: Secondary | ICD-10-CM | POA: Diagnosis not present

## 2022-09-20 DIAGNOSIS — I252 Old myocardial infarction: Secondary | ICD-10-CM | POA: Insufficient documentation

## 2022-09-20 DIAGNOSIS — Z79899 Other long term (current) drug therapy: Secondary | ICD-10-CM | POA: Insufficient documentation

## 2022-09-20 DIAGNOSIS — H2512 Age-related nuclear cataract, left eye: Secondary | ICD-10-CM | POA: Insufficient documentation

## 2022-09-20 DIAGNOSIS — I129 Hypertensive chronic kidney disease with stage 1 through stage 4 chronic kidney disease, or unspecified chronic kidney disease: Secondary | ICD-10-CM | POA: Diagnosis not present

## 2022-09-20 HISTORY — PX: CATARACT EXTRACTION W/PHACO: SHX586

## 2022-09-20 SURGERY — PHACOEMULSIFICATION, CATARACT, WITH IOL INSERTION
Anesthesia: Monitor Anesthesia Care | Site: Eye | Laterality: Left

## 2022-09-20 MED ORDER — TETRACAINE HCL 0.5 % OP SOLN
1.0000 [drp] | OPHTHALMIC | Status: DC | PRN
  Administered 2022-09-20 (×3): 1 [drp] via OPHTHALMIC

## 2022-09-20 MED ORDER — LIDOCAINE HCL (PF) 2 % IJ SOLN
INTRAOCULAR | Status: DC | PRN
  Administered 2022-09-20: 1 mL via INTRAOCULAR

## 2022-09-20 MED ORDER — SIGHTPATH DOSE#1 BSS IO SOLN
INTRAOCULAR | Status: DC | PRN
  Administered 2022-09-20: 15 mL

## 2022-09-20 MED ORDER — ARMC OPHTHALMIC DILATING DROPS
1.0000 | OPHTHALMIC | Status: DC | PRN
  Administered 2022-09-20 (×3): 1 via OPHTHALMIC

## 2022-09-20 MED ORDER — MIDAZOLAM HCL 2 MG/2ML IJ SOLN
INTRAMUSCULAR | Status: DC | PRN
  Administered 2022-09-20: 1 mg via INTRAVENOUS

## 2022-09-20 MED ORDER — BRIMONIDINE TARTRATE-TIMOLOL 0.2-0.5 % OP SOLN
OPHTHALMIC | Status: DC | PRN
  Administered 2022-09-20: 1 [drp] via OPHTHALMIC

## 2022-09-20 MED ORDER — LACTATED RINGERS IV SOLN: INTRAVENOUS | Status: DC

## 2022-09-20 MED ORDER — SIGHTPATH DOSE#1 NA CHONDROIT SULF-NA HYALURON 40-17 MG/ML IO SOLN
INTRAOCULAR | Status: DC | PRN
  Administered 2022-09-20: 1 mL via INTRAOCULAR

## 2022-09-20 MED ORDER — SIGHTPATH DOSE#1 BSS IO SOLN
INTRAOCULAR | Status: DC | PRN
  Administered 2022-09-20: 61 mL via OPHTHALMIC

## 2022-09-20 MED ORDER — MOXIFLOXACIN HCL 0.5 % OP SOLN
OPHTHALMIC | Status: DC | PRN
  Administered 2022-09-20: .2 mL via OPHTHALMIC

## 2022-09-20 SURGICAL SUPPLY — 11 items
ANGLE REVERSE CUT SHRT 25GA (CUTTER) ×1
CATARACT SUITE SIGHTPATH (MISCELLANEOUS) ×1 IMPLANT
CYSTOTOME ANGL RVRS SHRT 25G (CUTTER) ×1 IMPLANT
CYSTOTOME ANGL RVRS SHRT 25GA (CUTTER) ×1 IMPLANT
FEE CATARACT SUITE SIGHTPATH (MISCELLANEOUS) ×1 IMPLANT
GLOVE BIOGEL PI IND STRL 8 (GLOVE) ×1 IMPLANT
GLOVE SURG ENC TEXT LTX SZ8 (GLOVE) ×1 IMPLANT
LENS IOL TECNIS EYHANCE 15.0 (Intraocular Lens) IMPLANT
NDL FILTER BLUNT 18X1 1/2 (NEEDLE) ×1 IMPLANT
NEEDLE FILTER BLUNT 18X1 1/2 (NEEDLE) ×1 IMPLANT
SYR 3ML LL SCALE MARK (SYRINGE) ×1 IMPLANT

## 2022-09-20 NOTE — H&P (Signed)
[facility]   Primary Care Physician:  Eustaquio Boyden, MD Ophthalmologist: Dr. Druscilla Brownie  Pre-Procedure History & Physical: HPI:  Joseph Osborn is a 89 y.o. male here for cataract surgery.   Past Medical History:  Diagnosis Date   Arthritis    Basal cell carcinoma 02/04/2021   L groin, excised 03/23/21   BPH (benign prostatic hyperplasia)    CAD (coronary artery disease)    Chronic kidney disease    Stage III   Community acquired pneumonia 04/21/2021   Post COVID CAP s/p hospitalization   COPD (chronic obstructive pulmonary disease) (HCC)    COVID-19 04/13/2021   DDD (degenerative disc disease), cervical    DVT (deep venous thrombosis) (HCC) 05/30/2022   Left leg   Hypertension    Lipoma 2017   Lower back pain    Myocardial infarction (HCC)    approx 2000   Thyroid disease    Hypothyroidism   Wears dentures    partial upper    Past Surgical History:  Procedure Laterality Date   BLADDER SURGERY  03/2008   bladder polyp removed [name])   CARDIAC CATHETERIZATION  2000   1 stent placed after MI   CATARACT EXTRACTION W/ INTRAOCULAR LENS  IMPLANT, BILATERAL  2014   [facility], Dr. Druscilla Brownie   CHOLECYSTECTOMY     In [place]   COLONOSCOPY  03/13/2014   [facility], Dr. Mechele Collin   COLONOSCOPY WITH PROPOFOL N/A 03/03/2016   Procedure: COLONOSCOPY WITH PROPOFOL;  Surgeon: Midge Minium, MD;  Location: [facility];  Service: Endoscopy;  Laterality: N/A;   ESOPHAGOGASTRODUODENOSCOPY (EGD) WITH PROPOFOL N/A 03/03/2016   Procedure: ESOPHAGOGASTRODUODENOSCOPY (EGD) WITH PROPOFOL;  Surgeon: Midge Minium, MD;  Location: [facility];  Service: Endoscopy;  Laterality: N/A;  Interpreter needed   HEMORRHOID SURGERY     [place], [place]   TRANSURETHRAL RESECTION OF PROSTATE  03/24/2008   Dr Sheppard Penton, [facility]    Prior to Admission medications   Medication Sig Start Date End Date Taking? Authorizing Provider  acetaminophen (TYLENOL) 500 MG tablet  Take 2 tablets (1,000 mg total) by mouth in the morning, at noon, and at bedtime. 02/14/22  Yes Eustaquio Boyden, MD  apixaban (ELIQUIS) 5 MG TABS tablet Take 1 tablet (5 mg total) by mouth 2 (two) times daily. 07/07/22  Yes Schnier, Latina Craver, MD  atenolol (TENORMIN) 50 MG tablet Take 1 tablet (50 mg total) by mouth daily. 08/10/22  Yes Eustaquio Boyden, MD  Cholecalciferol (VITAMIN D3) 25 MCG (1000 UT) capsule Take 1 capsule (1,000 Units total) by mouth daily. 08/15/22  Yes Eustaquio Boyden, MD  ipratropium (ATROVENT) 0.03 % nasal spray Place 2 sprays into both nostrils every 12 (twelve) hours. 08/10/22  Yes Eustaquio Boyden, MD  isosorbide mononitrate (IMDUR) 30 MG 24 hr tablet Take 30 mg by mouth daily. 01/07/22  Yes [provider]  linaclotide (LINZESS) 72 MCG capsule Take 1 capsule (72 mcg total) by mouth every other day. 06/10/22  Yes Eustaquio Boyden, MD  doxycycline (VIBRA-TABS) 100 MG tablet Take 1 tablet (100 mg total) by mouth 2 (two) times daily. Patient not taking: Reported on 09/15/2022 08/16/22   [name], MD  predniSONE (DELTASONE) 20 MG tablet Take 2 tablets (40 mg total) by mouth daily. For 3 days, then 1 tab daily x 3 days Patient not taking: Reported on 09/15/2022 08/16/22   [name], MD    Allergies as of 08/29/2022 - Review Complete 08/16/2022  Allergen Reaction Noted  Shellfish allergy Anaphylaxis 02/20/2014   Oxybutynin Other (See Comments) 02/20/2014    Family History  Problem Relation Age of Onset   Heart disease Mother    Heart attack Mother    Liver cancer Cousin    Diabetes Neg Hx     Social History   Socioeconomic History   Marital status: Widowed    Spouse name: Not on file   Number of children: Not on file   Years of education: Not on file   Highest education level: Not on file  Occupational History   Not on file  Tobacco Use   Smoking status: Never   Smokeless tobacco: Never  Vaping Use   Vaping Use: Never used  Substance and  Sexual Activity   Alcohol use: Not Currently    Comment: very rare(Holidays)   Drug use: No   Sexual activity: Not on file  Other Topics Concern   Not on file  Social History Narrative   Lives with wife, daughter [name]) and her husband and son   From [place], [place]   Occ: retired, previously worked Visual merchandiser    Social Determinants of Corporate investment banker Strain: Low Risk  (07/15/2021)   Overall Financial Resource Strain (CARDIA)    Difficulty of Paying Living Expenses: Not hard at all  Food Insecurity: No Food Insecurity (07/15/2021)   Hunger Vital Sign    Worried About Running Out of Food in the Last Year: Never true    Ran Out of Food in the Last Year: Never true  Transportation Needs: No Transportation Needs (07/15/2021)   PRAPARE - Administrator, Civil Service (Medical): No    Lack of Transportation (Non-Medical): No  Physical Activity: Insufficiently Active (07/15/2021)   Exercise Vital Sign    Days of Exercise per Week: 4 days    Minutes of Exercise per Session: 30 min  Stress: No Stress Concern Present (07/15/2021)   [employer] of Occupational Health - Occupational Stress Questionnaire    Feeling of Stress : Only a little  Social Connections: Moderately Isolated (07/15/2021)   Social Connection and Isolation Panel [NHANES]    Frequency of Communication with Friends and Family: More than three times a week    Frequency of Social Gatherings with Friends and Family: More than three times a week    Attends Religious Services: Never    Database administrator or Organizations: No    Attends Banker Meetings: Never    Marital Status: Married  Catering manager Violence: Not At Risk (07/15/2021)   Humiliation, Afraid, Rape, and Kick questionnaire    Fear of Current or Ex-Partner: No    Emotionally Abused: No    Physically Abused: No    Sexually Abused: No    Review of Systems: See HPI, otherwise negative ROS  Physical Exam: BP (!) 160/82    Pulse 60   Temp 97.9 F (36.6 C) (Temporal)   Resp 15   Ht 5\' 4"  (1.626 m)   Wt 78.8 kg   SpO2 96%   BMI 29.82 kg/m  General:   Alert, cooperative in NAD Head:  Normocephalic and atraumatic. Respiratory:  Normal work of breathing. Cardiovascular:  RRR  Impression/Plan: Joseph Osborn is here for cataract surgery.  Risks, benefits, limitations, and alternatives regarding cataract surgery have been reviewed with the patient.  Questions have been answered.  All parties agreeable.   Galen Manila, MD  09/20/2022, 12:33 PM

## 2022-09-20 NOTE — Anesthesia Postprocedure Evaluation (Signed)
Anesthesia Post Note  Patient: Joseph Osborn  Procedure(s) Performed: CATARACT EXTRACTION PHACO AND INTRAOCULAR LENS PLACEMENT (IOC) LEFT  9.43  00:57.3 (Left: Eye)  Patient location during evaluation: PACU Anesthesia Type: MAC Level of consciousness: awake and alert Pain management: pain level controlled Vital Signs Assessment: post-procedure vital signs reviewed and stable Respiratory status: spontaneous breathing, nonlabored ventilation, respiratory function stable and patient connected to nasal cannula oxygen Cardiovascular status: stable and blood pressure returned to baseline Postop Assessment: no apparent nausea or vomiting Anesthetic complications: no   No notable events documented.   Last Vitals:  Vitals:   09/20/22 1300 09/20/22 1308  BP: 138/84 125/77  Pulse: 61 (!) 59  Resp: 16 15  Temp: 36.5 C 36.5 C  SpO2: 96% 95%    Last Pain:  Vitals:   09/20/22 1308  TempSrc:   PainSc: 0-No pain                 Daxtyn Rottenberg C Obdulio Mash

## 2022-09-20 NOTE — Transfer of Care (Signed)
Immediate Anesthesia Transfer of Care Note  Patient: Joseph Osborn  Procedure(s) Performed: CATARACT EXTRACTION PHACO AND INTRAOCULAR LENS PLACEMENT (IOC) LEFT  9.43  00:57.3 (Left: Eye)  Patient Location: PACU  Anesthesia Type: MAC  Level of Consciousness: awake, alert  and patient cooperative  Airway and Oxygen Therapy: Patient Spontanous Breathing and Patient connected to supplemental oxygen  Post-op Assessment: Post-op Vital signs reviewed, Patient's Cardiovascular Status Stable, Respiratory Function Stable, Patent Airway and No signs of Nausea or vomiting  Post-op Vital Signs: Reviewed and stable  Complications: No notable events documented.

## 2022-09-20 NOTE — Op Note (Signed)
PREOPERATIVE DIAGNOSIS:  Nuclear sclerotic cataract of the left eye.   POSTOPERATIVE DIAGNOSIS:  Nuclear sclerotic cataract of the left eye.   OPERATIVE PROCEDURE:ORPROCALL@   SURGEON:  Galen Manila, MD.   ANESTHESIA:  Anesthesiologist: Marisue Humble, MD CRNA: Domenic Moras, CRNA  1.      Managed anesthesia care. 2.     0.67ml of Shugarcaine was instilled following the paracentesis   COMPLICATIONS:  None.   TECHNIQUE:   Stop and chop   DESCRIPTION OF PROCEDURE:  The patient was examined and consented in the preoperative holding area where the aforementioned topical anesthesia was applied to the left eye and then brought back to the Operating Room where the left eye was prepped and draped in the usual sterile ophthalmic fashion and a lid speculum was placed. A paracentesis was created with the side port blade and the anterior chamber was filled with viscoelastic. A near clear corneal incision was performed with the steel keratome. A continuous curvilinear capsulorrhexis was performed with a cystotome followed by the capsulorrhexis forceps. Hydrodissection and hydrodelineation were carried out with BSS on a blunt cannula. The lens was removed in a stop and chop  technique and the remaining cortical material was removed with the irrigation-aspiration handpiece. The capsular bag was inflated with viscoelastic and the Technis [id number] lens was placed in the capsular bag without complication. The remaining viscoelastic was removed from the eye with the irrigation-aspiration handpiece. The wounds were hydrated. The anterior chamber was flushed with BSS and the eye was inflated to physiologic pressure. 0.22ml Vigamox was placed in the anterior chamber. The wounds were found to be water tight. The eye was dressed with Combigan. The patient was given protective glasses to wear throughout the day and a shield with which to sleep tonight. The patient was also given drops with which to begin a drop  regimen today and will follow-up with me in one day. Implant Name Type Inv. Item Serial No. Manufacturer Lot No. LRB No. Used Action  LENS IOL TECNIS EYHANCE 15.0 - [id number] Intraocular Lens LENS IOL TECNIS EYHANCE 15.0 [id number] [employer]  Left 1 Implanted    Procedure(s): CATARACT EXTRACTION PHACO AND INTRAOCULAR LENS PLACEMENT (IOC) LEFT (Left)  Electronically signed: Galen Manila 09/20/2022 12:59 PM

## 2022-09-21 ENCOUNTER — Encounter: Payer: Self-pay | Admitting: Ophthalmology

## 2022-10-06 ENCOUNTER — Emergency Department: Payer: Medicare Other

## 2022-10-06 ENCOUNTER — Other Ambulatory Visit: Payer: Self-pay

## 2022-10-06 ENCOUNTER — Emergency Department
Admission: EM | Admit: 2022-10-06 | Discharge: 2022-10-06 | Disposition: A | Discharge status: Home / Self Care | Payer: Medicare Other | Attending: Student in an Organized Health Care Education/Training Program | Admitting: Student in an Organized Health Care Education/Training Program

## 2022-10-06 ENCOUNTER — Telehealth: Payer: Self-pay | Admitting: Family Medicine

## 2022-10-06 DIAGNOSIS — S0990XA Unspecified injury of head, initial encounter: Secondary | ICD-10-CM | POA: Insufficient documentation

## 2022-10-06 DIAGNOSIS — W19XXXA Unspecified fall, initial encounter: Secondary | ICD-10-CM

## 2022-10-06 DIAGNOSIS — Z7901 Long term (current) use of anticoagulants: Secondary | ICD-10-CM | POA: Diagnosis not present

## 2022-10-06 DIAGNOSIS — W01198A Fall on same level from slipping, tripping and stumbling with subsequent striking against other object, initial encounter: Secondary | ICD-10-CM | POA: Diagnosis not present

## 2022-10-06 LAB — CBC WITH DIFFERENTIAL/PLATELET
Abs Immature Granulocytes: 0.02 10*3/uL (ref 0.00–0.07)
Basophils Absolute: 0.1 10*3/uL (ref 0.0–0.1)
Basophils Relative: 2 %
Eosinophils Absolute: 0.2 10*3/uL (ref 0.0–0.5)
Eosinophils Relative: 5 %
HCT: 44.9 % (ref 39.0–52.0)
Hemoglobin: 14.7 g/dL (ref 13.0–17.0)
Immature Granulocytes: 1 %
Lymphocytes Relative: 37 %
Lymphs Abs: 1.5 10*3/uL (ref 0.7–4.0)
MCH: 31.1 pg (ref 26.0–34.0)
MCHC: 32.7 g/dL (ref 30.0–36.0)
MCV: 95.1 fL (ref 80.0–100.0)
Monocytes Absolute: 0.3 10*3/uL (ref 0.1–1.0)
Monocytes Relative: 7 %
Neutro Abs: 1.9 10*3/uL (ref 1.7–7.7)
Neutrophils Relative %: 48 %
Platelets: 182 10*3/uL (ref 150–400)
RBC: 4.72 MIL/uL (ref 4.22–5.81)
RDW: 13.3 % (ref 11.5–15.5)
WBC: 4 10*3/uL (ref 4.0–10.5)
nRBC: 0 % (ref 0.0–0.2)

## 2022-10-06 LAB — COMPREHENSIVE METABOLIC PANEL
ALT: 22 U/L (ref 0–44)
AST: 30 U/L (ref 15–41)
Albumin: 4.1 g/dL (ref 3.5–5.0)
Alkaline Phosphatase: 73 U/L (ref 38–126)
Anion gap: 7 (ref 5–15)
BUN: 16 mg/dL (ref 8–23)
CO2: 27 mmol/L (ref 22–32)
Calcium: 9.1 mg/dL (ref 8.9–10.3)
Chloride: 107 mmol/L (ref 98–111)
Creatinine, Ser: 1.12 mg/dL (ref 0.61–1.24)
GFR, Estimated: >60 mL/min (ref >60)
Glucose, Bld: 85 mg/dL (ref 70–99)
Potassium: 4.7 mmol/L (ref 3.5–5.1)
Sodium: 141 mmol/L (ref 135–145)
Total Bilirubin: 0.8 mg/dL (ref 0.3–1.2)
Total Protein: 7.2 g/dL (ref 6.5–8.1)

## 2022-10-06 LAB — APTT: aPTT: 33 seconds (ref 24–36)

## 2022-10-06 LAB — PROTIME-INR
INR: 1.3 — ABNORMAL HIGH (ref 0.8–1.2)
Prothrombin Time: 16.6 seconds — ABNORMAL HIGH (ref 11.4–15.2)

## 2022-10-06 NOTE — Telephone Encounter (Signed)
FYI: This call has been transferred to Access Nurse. Once the result note has been entered staff can address the message at that time.  Patient called in with the following symptoms:  Red Word:fall , caller states patient fell because he was dizzy, he was found on his back and is believed to hit the right side of head. Caller also says patient has had SOB/ rattling noise and congestion in chest within the past few days    Please advise at Mobile [phone] (mobile)  Message is routed to Provider Pool and [facility] Triage

## 2022-10-06 NOTE — Telephone Encounter (Signed)
Patient's daughter called to make hospital follow up visit for the patient, states he was released earlier and is doing fine.

## 2022-10-06 NOTE — Telephone Encounter (Signed)
Noted  

## 2022-10-06 NOTE — Telephone Encounter (Signed)
Noted. Overall reassuring evaluation with head and neck CT.

## 2022-10-06 NOTE — ED Notes (Signed)
ED Provider at bedside. 

## 2022-10-06 NOTE — ED Provider Notes (Signed)
[facility] Provider Note    Event Date/Time   First MD Initiated Contact with Patient 10/06/22 1228     (approximate)   History   Fall   HPI  Joseph Osborn is a 89 y.o. male on Eliquis presents to the ER for evaluation of episode this morning when he was trying to put covers on his bed falling over hitting the right side of his head.  No LOC.  He is on Eliquis.  Denies any neck pain no back pain or chest pain.  States that from time to time he does get dizzy with ambulation.  Denies any dizziness or discomfort right now.     Physical Exam   Triage Vital Signs: ED Triage Vitals [10/06/22 1133]  Enc Vitals Group     BP 136/77     Pulse Rate 64     Resp 16     Temp 98.4 F (36.9 C)     Temp Source Oral     SpO2 95 %     Weight 171 lb 15.3 oz (78 kg)     Height 5\' 4"  (1.626 m)     Head Circumference      Peak Flow      Pain Score 0     Pain Loc      Pain Edu?      Excl. in GC?     Most recent vital signs: Vitals:   10/06/22 1133  BP: 136/77  Pulse: 64  Resp: 16  Temp: 98.4 F (36.9 C)  SpO2: 95%     Constitutional: Alert  Eyes: Conjunctivae are normal.  Head: Atraumatic. Nose: No congestion/rhinnorhea. Mouth/Throat: Mucous membranes are moist.   Neck: Painless ROM.  Cardiovascular:   Good peripheral circulation. Respiratory: Normal respiratory effort.  No retractions.  Gastrointestinal: Soft and nontender.  Musculoskeletal:  no deformity Neurologic:  CN- intact.  No facial droop, Normal FNF.  Normal heel to shin.  Sensation intact bilaterally. Normal speech and language. No gross focal neurologic deficits are appreciated. No gait instability. Skin:  Skin is warm, dry and intact. No rash noted. Psychiatric: Mood and affect are normal. Speech and behavior are normal.    ED Results / Procedures / Treatments   Labs (all labs ordered are listed, but only abnormal results are displayed) Labs Reviewed  PROTIME-INR -  Abnormal; Notable for the following components:      Result Value   Prothrombin Time 16.6 (*)    INR 1.3 (*)    All other components within normal limits  CBC WITH DIFFERENTIAL/PLATELET  COMPREHENSIVE METABOLIC PANEL  APTT     EKG  ED ECG REPORT I, Willy Eddy, the attending physician, personally viewed and interpreted this ECG.   Date: 10/06/2022  EKG Time: 11:40  Rate: 65  Rhythm: sinus  Axis: left  Intervals: normal  ST&T Change: no stemi, no depression    RADIOLOGY Please see ED Course for my review and interpretation.  I personally reviewed all radiographic images ordered to evaluate for the above acute complaints and reviewed radiology reports and findings.  These findings were personally discussed with the patient.  Please see medical record for radiology report.    PROCEDURES:  Critical Care performed: No  Procedures   MEDICATIONS ORDERED IN ED: Medications - No data to display   IMPRESSION / MDM / ASSESSMENT AND PLAN / ED COURSE  I reviewed the triage vital signs and the nursing notes.  Differential diagnosis includes, but is not limited to, SDH, IPH, CVA, TIA, orthostasis, vertigo, electrolyte abnormality, dysrhythmia  Patient presenting to the ER for evaluation of symptoms as described above.  Based on symptoms, risk factors and considered above differential, this presenting complaint could reflect a potentially life-threatening illness therefore the patient will be placed on continuous pulse oximetry and telemetry for monitoring.  Laboratory evaluation will be sent to evaluate for the above complaints.  ET imaging ordered triage on my review and interpretation without evidence of SDH or IPH.  Will await formal radiology report.  No anemia blood work.  He appears well and nontoxic.  No focal neurodeficits.  No evidence of traumatic injury.    Clinical Course as of 10/06/22 1323  Thu Oct 06, 2022  1322 Patient  reassessed he is not orthostatic feels well in no acute distress.  Do not feel that further diagnostic testing clinically indicated at this time.  Family reassured patient agreeable with outpatient follow-up. [PR]    Clinical Course User Index [PR] Willy Eddy, MD     FINAL CLINICAL IMPRESSION(S) / ED DIAGNOSES   Final diagnoses:  Fall, initial encounter  Minor head injury, initial encounter     Rx / DC Orders   ED Discharge Orders     None        Note:  This document was prepared using Dragon voice recognition software and may include unintentional dictation errors.    Willy Eddy, MD 10/06/22 1323

## 2022-10-06 NOTE — ED Triage Notes (Signed)
Pt fell this morning at 320 was trying to fix covers in bed and lost balance and hit right side of head no LOC, pt is on eliquis. Denies neck or back pain.

## 2022-10-06 NOTE — Telephone Encounter (Signed)
Per access nurse note pt agreed to go to ED. Sending note to Dr Sharen Hones and Sharen Hones pool.

## 2022-10-10 ENCOUNTER — Ambulatory Visit (INDEPENDENT_AMBULATORY_CARE_PROVIDER_SITE_OTHER): Payer: Medicare Other | Admitting: Family Medicine

## 2022-10-10 ENCOUNTER — Encounter: Payer: Self-pay | Admitting: Family Medicine

## 2022-10-10 ENCOUNTER — Other Ambulatory Visit: Payer: Self-pay | Admitting: Family Medicine

## 2022-10-10 VITALS — BP 126/76 | HR 75 | Temp 98.5°F | Ht 64.0 in | Wt 177.0 lb

## 2022-10-10 DIAGNOSIS — R140 Abdominal distension (gaseous): Secondary | ICD-10-CM

## 2022-10-10 DIAGNOSIS — W19XXXA Unspecified fall, initial encounter: Secondary | ICD-10-CM | POA: Diagnosis not present

## 2022-10-10 DIAGNOSIS — J449 Chronic obstructive pulmonary disease, unspecified: Secondary | ICD-10-CM | POA: Diagnosis not present

## 2022-10-10 DIAGNOSIS — R2681 Unsteadiness on feet: Secondary | ICD-10-CM

## 2022-10-10 DIAGNOSIS — J310 Chronic rhinitis: Secondary | ICD-10-CM

## 2022-10-10 DIAGNOSIS — E739 Lactose intolerance, unspecified: Secondary | ICD-10-CM

## 2022-10-10 DIAGNOSIS — Y92009 Unspecified place in unspecified non-institutional (private) residence as the place of occurrence of the external cause: Secondary | ICD-10-CM

## 2022-10-10 DIAGNOSIS — I82402 Acute embolism and thrombosis of unspecified deep veins of left lower extremity: Secondary | ICD-10-CM

## 2022-10-10 MED ORDER — TIOTROPIUM BROMIDE MONOHYDRATE 18 MCG IN CAPS: 18.0000 ug | ORAL_CAPSULE | Freq: Every day | RESPIRATORY_TRACT | 6 refills | Status: DC

## 2022-10-10 NOTE — Progress Notes (Unsigned)
Ph: [phone] Fax: [phone]   Patient ID: Joseph Osborn, male    DOB: [birth year], 89 y.o.   MRN: [id number]  This visit was conducted in person.  BP 126/76   Pulse 75   Temp 98.5 F (36.9 C) (Temporal)   Ht 5\' 4"  (1.626 m)   Wt 177 lb (80.3 kg)   SpO2 96%   BMI 30.38 kg/m    CC: ER f/u visit  Subjective:   HPI: Joseph Osborn is a 89 y.o. male presenting on 10/10/2022 for Hospitalization Follow-up (See in ED after fall ) and Medication Problem (Would like to stop Eliquis )   Recent ER visit 5/30 at 3am after fall while making his bed - hit right temple of head. No LOC. Reassuring evaluation including CT head and cervical spine.  He feels eliquis is causing dizziness/unsteadiness and would like to stop this - see below.   ER imaging: CT head: 1.  No evidence of an acute intracranial abnormality. 2. Advanced chronic small vessel ischemic changes within the cerebral white matter. 3. Moderate generalized cerebral atrophy.   CT cervical spine: 1. No evidence of an acute fracture to the cervical spine. 2. Mild grade 1 anterolisthesis at C4-C5, C5-C6 and C7-T1. 3. Cervical spondylosis as described. 4. Facet ankylosis on the right at C2-C3 and on the left at C5-C6.  Recent diagnosis of large occlusive LLE DVT extending from L proximal femoral vein to popliteal vein into calf veins. Eliquis 5mg  BID started ~06/02/2022, tolerating well. Saw [facility] Dr Gilda Crease who recommended compression stockings - he has been unable to tolerate this. Plan was to complete 6 months of eliquis.   Ipratropium nasal spray caused buzzing in ears so he subsequently stopped.  Notes ongoing chest congestion, with wheezing, worse in the mornings     Relevant past medical, surgical, family and social history reviewed and updated as indicated. Interim medical history since our last visit reviewed. Allergies and medications reviewed and updated. Outpatient Medications Prior to Visit   Medication Sig Dispense Refill   acetaminophen (TYLENOL) 500 MG tablet Take 2 tablets (1,000 mg total) by mouth in the morning, at noon, and at bedtime.     apixaban (ELIQUIS) 5 MG TABS tablet Take 1 tablet (5 mg total) by mouth 2 (two) times daily. 60 tablet 5   atenolol (TENORMIN) 50 MG tablet Take 1 tablet (50 mg total) by mouth daily. 90 tablet 3   Cholecalciferol (VITAMIN D3) 25 MCG (1000 UT) capsule Take 1 capsule (1,000 Units total) by mouth daily. 30 capsule    isosorbide mononitrate (IMDUR) 30 MG 24 hr tablet Take 30 mg by mouth daily.     linaclotide (LINZESS) 72 MCG capsule Take 1 capsule (72 mcg total) by mouth every other day. 45 capsule 1   doxycycline (VIBRA-TABS) 100 MG tablet Take 1 tablet (100 mg total) by mouth 2 (two) times daily. (Patient not taking: Reported on 09/15/2022) 14 tablet 0   ipratropium (ATROVENT) 0.03 % nasal spray Place 2 sprays into both nostrils every 12 (twelve) hours. 30 mL 0   predniSONE (DELTASONE) 20 MG tablet Take 2 tablets (40 mg total) by mouth daily. For 3 days, then 1 tab daily x 3 days (Patient not taking: Reported on 09/15/2022) 9 tablet 0   No facility-administered medications prior to visit.     Per HPI unless specifically indicated in ROS section below Review of Systems  Objective:  BP 126/76   Pulse 75  Temp 98.5 F (36.9 C) (Temporal)   Ht 5\' 4"  (1.626 m)   Wt 177 lb (80.3 kg)   SpO2 96%   BMI 30.38 kg/m   Wt Readings from Last 3 Encounters:  10/10/22 177 lb (80.3 kg)  10/06/22 171 lb 15.3 oz (78 kg)  09/20/22 173 lb 11.2 oz (78.8 kg)      Physical Exam Vitals and nursing note reviewed.  Constitutional:      Appearance: Normal appearance. He is not ill-appearing.  HENT:     Head: Normocephalic and atraumatic.     Mouth/Throat:     Mouth: Mucous membranes are moist.     Pharynx: Oropharynx is clear. No oropharyngeal exudate or posterior oropharyngeal erythema.  Eyes:     Extraocular Movements: Extraocular movements  intact.     Conjunctiva/sclera: Conjunctivae normal.     Pupils: Pupils are equal, round, and reactive to light.  Cardiovascular:     Rate and Rhythm: Normal rate and regular rhythm.     Pulses: Normal pulses.     Heart sounds: Normal heart sounds. No murmur heard. Pulmonary:     Effort: Pulmonary effort is normal. No respiratory distress.     Breath sounds: Normal breath sounds. No wheezing, rhonchi or rales.  Musculoskeletal:     Right lower leg: No edema.     Left lower leg: No edema.     Comments:  R knee brace in place No palpable cords or calf tenderness  No significant pedal edema  Skin:    General: Skin is warm and dry.  Neurological:     Mental Status: He is alert.  Psychiatric:        Mood and Affect: Mood normal.        Behavior: Behavior normal.       Results for orders placed or performed during the hospital encounter of 10/06/22  CBC with Differential  Result Value Ref Range   WBC 4.0 4.0 - 10.5 K/uL   RBC 4.72 4.22 - 5.81 MIL/uL   Hemoglobin 14.7 13.0 - 17.0 g/dL   HCT 09.8 11.9 - 14.7 %   MCV 95.1 80.0 - 100.0 fL   MCH 31.1 26.0 - 34.0 pg   MCHC 32.7 30.0 - 36.0 g/dL   RDW 82.9 56.2 - 13.0 %   Platelets 182 150 - 400 K/uL   nRBC 0.0 0.0 - 0.2 %   Neutrophils Relative % 48 %   Neutro Abs 1.9 1.7 - 7.7 K/uL   Lymphocytes Relative 37 %   Lymphs Abs 1.5 0.7 - 4.0 K/uL   Monocytes Relative 7 %   Monocytes Absolute 0.3 0.1 - 1.0 K/uL   Eosinophils Relative 5 %   Eosinophils Absolute 0.2 0.0 - 0.5 K/uL   Basophils Relative 2 %   Basophils Absolute 0.1 0.0 - 0.1 K/uL   Immature Granulocytes 1 %   Abs Immature Granulocytes 0.02 0.00 - 0.07 K/uL  Comprehensive metabolic panel  Result Value Ref Range   Sodium 141 135 - 145 mmol/L   Potassium 4.7 3.5 - 5.1 mmol/L   Chloride 107 98 - 111 mmol/L   CO2 27 22 - 32 mmol/L   Glucose, Bld 85 70 - 99 mg/dL   BUN 16 8 - 23 mg/dL   Creatinine, Ser 8.65 0.61 - 1.24 mg/dL   Calcium 9.1 8.9 - 78.4 mg/dL   Total  Protein 7.2 6.5 - 8.1 g/dL   Albumin 4.1 3.5 - 5.0 g/dL   AST 30  15 - 41 U/L   ALT 22 0 - 44 U/L   Alkaline Phosphatase 73 38 - 126 U/L   Total Bilirubin 0.8 0.3 - 1.2 mg/dL   GFR, Estimated >40 >98 mL/min   Anion gap 7 5 - 15  Protime-INR  Result Value Ref Range   Prothrombin Time 16.6 (H) 11.4 - 15.2 seconds   INR 1.3 (H) 0.8 - 1.2  APTT  Result Value Ref Range   aPTT 33 24 - 36 seconds    Assessment & Plan:   Problem List Items Addressed This Visit     COPD (chronic obstructive pulmonary disease) (HCC)    Anticipate chest congestion symptoms coming from lung secretions in h/o COPD. Previously no benefit with incruse ellipta - will trial spiriva nightly.  Recently treated for COPD exacerbation with doxy/prednisone with limited benefit.      Relevant Medications   tiotropium (SPIRIVA HANDIHALER) 18 MCG inhalation capsule   Chronic rhinitis    No treatments effective to date - INS, oral antihistamine, nasal atrovent      Gassiness    Ongoing difficulty - anticipate component of lactose intolerance as he does better when he avoids dairy products       General unsteadiness    Has been on eliquis for 4 months - given increased fall risk reasonable to discontinue early - see below for plan.      Leg DVT (deep venous thromboembolism), acute, left (HCC)    Large LLE thrombus extending from proximal femoral vein to popliteal vein 05/30/2022 on eliquis since then.  He feels eliquis may be contributing to dizziness. Fortunately recent CBC without anemia.  Discussed options - will update venous US and if no DVT, will stop eliquis early.  He will keep [facility] f/u 11/2022.       Relevant Orders   VAS [place] LOWER EXTREMITY VENOUS (DVT)   Fall at home, initial encounter - Primary    Fall at home - lost balance in the middle of the night while trying to straighten his comforter. Hit temple and neck - with reassuring imaging at ER, and no residual pain/discomfort.  See below for fall risk  and AC use.       Lactose intolerance     Meds ordered this encounter  Medications   tiotropium (SPIRIVA HANDIHALER) 18 MCG inhalation capsule    Sig: Place 1 capsule (18 mcg total) into inhaler and inhale daily.    Dispense:  30 capsule    Refill:  6    No orders of the defined types were placed in this encounter.   Patient Instructions  Voy a ordenar sonograma para evaluar coagulo de nuevo. Si coagulo se ha resuelto, podemos parar eliquis temprano.  Puede tratar inhalador Spiriva cada noche para controlar secreciones  Regresar en 4-6 semanas para control.   Follow up plan: Return in about 6 weeks (around 11/21/2022), or if symptoms worsen or fail to improve, for follow up visit.  Eustaquio Boyden, MD

## 2022-10-10 NOTE — Patient Instructions (Addendum)
Voy a ordenar sonograma para evaluar coagulo de nuevo. Si coagulo se ha resuelto, podemos parar eliquis temprano.  Puede tratar inhalador Spiriva cada noche para controlar secreciones  Regresar en 4-6 semanas para control.

## 2022-10-11 DIAGNOSIS — E739 Lactose intolerance, unspecified: Secondary | ICD-10-CM | POA: Insufficient documentation

## 2022-10-11 DIAGNOSIS — R296 Repeated falls: Secondary | ICD-10-CM | POA: Insufficient documentation

## 2022-10-11 DIAGNOSIS — W19XXXA Unspecified fall, initial encounter: Secondary | ICD-10-CM | POA: Insufficient documentation

## 2022-10-11 NOTE — Assessment & Plan Note (Signed)
Large LLE thrombus extending from proximal femoral vein to popliteal vein 05/30/2022 on eliquis since then.  He feels eliquis may be contributing to dizziness. Fortunately recent CBC without anemia.  Discussed options - will update venous US and if no DVT, will stop eliquis early.  He will keep [facility] f/u 11/2022.

## 2022-10-11 NOTE — Assessment & Plan Note (Signed)
Anticipate chest congestion symptoms coming from lung secretions in h/o COPD. Previously no benefit with incruse ellipta - will trial spiriva nightly.  Recently treated for COPD exacerbation with doxy/prednisone with limited benefit.

## 2022-10-11 NOTE — Assessment & Plan Note (Signed)
Fall at home - lost balance in the middle of the night while trying to straighten his comforter. Hit temple and neck - with reassuring imaging at ER, and no residual pain/discomfort.  See below for fall risk and AC use.

## 2022-10-11 NOTE — Assessment & Plan Note (Signed)
Has been on eliquis for 4 months - given increased fall risk reasonable to discontinue early - see below for plan.

## 2022-10-11 NOTE — Assessment & Plan Note (Signed)
No treatments effective to date - INS, oral antihistamine, nasal atrovent

## 2022-10-11 NOTE — Assessment & Plan Note (Addendum)
Ongoing difficulty - anticipate component of lactose intolerance as he does better when he avoids dairy products

## 2022-10-12 NOTE — Telephone Encounter (Signed)
Refill left on vm at pharmacy.  

## 2022-10-12 NOTE — Telephone Encounter (Signed)
ERx failed. Please phone in.

## 2022-10-12 NOTE — Telephone Encounter (Signed)
Message from pharmacy:   please resend for breo or advair per insurance. thanks.

## 2022-10-12 NOTE — Telephone Encounter (Signed)
Will change to Breo

## 2022-10-13 ENCOUNTER — Ambulatory Visit (INDEPENDENT_AMBULATORY_CARE_PROVIDER_SITE_OTHER): Payer: Medicare Other | Admitting: Vascular Surgery

## 2022-10-13 NOTE — Telephone Encounter (Signed)
Sending note to Lisa  CMA. 

## 2022-10-13 NOTE — Telephone Encounter (Signed)
Noted.  Refill left on vm at pharmacy.

## 2022-10-14 ENCOUNTER — Ambulatory Visit (INDEPENDENT_AMBULATORY_CARE_PROVIDER_SITE_OTHER): Payer: Medicare Other

## 2022-10-14 DIAGNOSIS — I82402 Acute embolism and thrombosis of unspecified deep veins of left lower extremity: Secondary | ICD-10-CM | POA: Diagnosis not present

## 2022-10-17 ENCOUNTER — Ambulatory Visit (INDEPENDENT_AMBULATORY_CARE_PROVIDER_SITE_OTHER): Payer: Medicare Other | Admitting: Vascular Surgery

## 2022-10-17 ENCOUNTER — Encounter (INDEPENDENT_AMBULATORY_CARE_PROVIDER_SITE_OTHER): Payer: Self-pay | Admitting: Vascular Surgery

## 2022-10-17 VITALS — BP 144/83 | HR 65 | Resp 16 | Wt 176.6 lb

## 2022-10-17 DIAGNOSIS — J449 Chronic obstructive pulmonary disease, unspecified: Secondary | ICD-10-CM

## 2022-10-17 DIAGNOSIS — N1831 Chronic kidney disease, stage 3a: Secondary | ICD-10-CM

## 2022-10-17 DIAGNOSIS — I25119 Atherosclerotic heart disease of native coronary artery with unspecified angina pectoris: Secondary | ICD-10-CM

## 2022-10-17 DIAGNOSIS — M159 Polyosteoarthritis, unspecified: Secondary | ICD-10-CM

## 2022-10-17 DIAGNOSIS — I82402 Acute embolism and thrombosis of unspecified deep veins of left lower extremity: Secondary | ICD-10-CM | POA: Diagnosis not present

## 2022-10-17 NOTE — Progress Notes (Signed)
MRN : [id number]  NUR KRALIK Joseph Osborn is a 89 y.o. ([birth year]) male who presents with chief complaint of legs hurt and swell.  History of Present Illness:   The patient presents to the office for evaluation of DVT.  DVT was identified at [facility] by Duplex ultrasound on 05/30/2022.  The initial symptoms were pain and swelling in the lower extremity.   The patient notes the leg continues to be painful with dependency and swells as well.  Symptoms are much better with elevation.  The patient notes minimal edema in the morning which steadily worsens throughout the day.     The patient has not been using compression therapy at this point.   No SOB or pleuritic chest pains.  No cough or hemoptysis.   No blood per rectum or blood in any sputum.  No excessive bruising per the patient.    No recent shortening of the patient's walking distance or new symptoms consistent with claudication.  No history of rest pain symptoms. No new ulcers or wounds of the lower extremities have occurred.   The patient denies amaurosis fugax or recent TIA symptoms. There are no recent neurological changes noted. No recent episodes of angina or shortness of breath documented.    I have personally reviewed the DVT scan dated 05/30/2022 and concur with the following:  Nearly occlusive to occlusive thrombus extending from the left proximal femoral vein to the popliteal vein. Calf veins were poorly seen on gray scale imaging, but were notable for occlusive thrombus in the left peroneal vein and likely nonocclusive thrombus in the left posterior tibial vein.  No outpatient medications have been marked as taking for the 10/17/22 encounter (Appointment) with Gilda Crease, Latina Craver, MD.    Past Medical History:  Diagnosis Date   Arthritis    Basal cell carcinoma 02/04/2021   L groin, excised 03/23/21   BPH (benign prostatic hyperplasia)    CAD (coronary artery disease)    Chronic kidney disease    Stage III    Community acquired pneumonia 04/21/2021   Post COVID CAP s/p hospitalization   COPD (chronic obstructive pulmonary disease) (HCC)    COVID-19 04/13/2021   DDD (degenerative disc disease), cervical    DVT (deep venous thrombosis) (HCC) 05/30/2022   Left leg   Hypertension    Lipoma 2017   Lower back pain    Myocardial infarction (HCC)    approx 2000   Thyroid disease    Hypothyroidism   Wears dentures    partial upper    Past Surgical History:  Procedure Laterality Date   BLADDER SURGERY  03/2008   bladder polyp removed [name])   CARDIAC CATHETERIZATION  2000   1 stent placed after MI   CATARACT EXTRACTION W/ INTRAOCULAR LENS  IMPLANT, BILATERAL  2014   [facility], Dr. Druscilla Brownie   CATARACT EXTRACTION W/PHACO Left 09/20/2022   Procedure: CATARACT EXTRACTION PHACO AND INTRAOCULAR LENS PLACEMENT (IOC) LEFT  9.43  00:57.3;  Surgeon: Galen Manila, MD;  Location: [facility];  Service: Ophthalmology;  Laterality: Left;   CHOLECYSTECTOMY     In [place]   COLONOSCOPY  03/13/2014   [facility], Dr. Mechele Collin   COLONOSCOPY WITH PROPOFOL N/A 03/03/2016   Procedure: COLONOSCOPY WITH PROPOFOL;  Surgeon: Midge Minium, MD;  Location[facility];  Service: Endoscopy;  Laterality: N/A;   ESOPHAGOGASTRODUODENOSCOPY (EGD) WITH PROPOFOL N/A 03/03/2016   Procedure: ESOPHAGOGASTRODUODENOSCOPY (EGD) WITH PROPOFOL;  Surgeon: Midge Minium, MD;  Location: [facility];  Service: Endoscopy;  Laterality: N/A;  Interpreter needed   HEMORRHOID SURGERY     [place], [place]   TRANSURETHRAL RESECTION OF PROSTATE  03/24/2008   Dr Sheppard Penton, [facility]    Social History Social History   Tobacco Use   Smoking status: Never   Smokeless tobacco: Never  Vaping Use   Vaping Use: Never used  Substance Use Topics   Alcohol use: Not Currently    Comment: very rare(Holidays)   Drug use: No    Family History Family History  Problem Relation Age of Onset   Heart disease Mother     Heart attack Mother    Liver cancer Cousin    Diabetes Neg Hx     Allergies  Allergen Reactions   Shellfish Allergy Anaphylaxis   Oxybutynin Other (See Comments)    Doesn't remember reaction     REVIEW OF SYSTEMS (Negative unless checked)  Constitutional: [] Weight loss  [] Fever  [] Chills Cardiac: [] Chest pain   [] Chest pressure   [] Palpitations   [] Shortness of breath when laying flat   [] Shortness of breath with exertion. Vascular:  [] Pain in legs with walking   [x] Pain in legs at rest  [] History of DVT   [] Phlebitis   [x] Swelling in legs   [] Varicose veins   [] Non-healing ulcers Pulmonary:   [] Uses home oxygen   [] Productive cough   [] Hemoptysis   [] Wheeze  [] COPD   [] Asthma Neurologic:  [] Dizziness   [] Seizures   [] History of stroke   [] History of TIA  [] Aphasia   [] Vissual changes   [] Weakness or numbness in arm   [] Weakness or numbness in leg Musculoskeletal:   [] Joint swelling   [] Joint pain   [] Low back pain Hematologic:  [] Easy bruising  [] Easy bleeding   [] Hypercoagulable state   [] Anemic Gastrointestinal:  [] Diarrhea   [] Vomiting  [] Gastroesophageal reflux/heartburn   [] Difficulty swallowing. Genitourinary:  [] Chronic kidney disease   [] Difficult urination  [] Frequent urination   [] Blood in urine Skin:  [] Rashes   [] Ulcers  Psychological:  [] History of anxiety   []  History of major depression.  Physical Examination  There were no vitals filed for this visit. There is no height or weight on file to calculate BMI. Gen: WD/WN, NAD Head: [place]/AT, No temporalis wasting.  Ear/Nose/Throat: Hearing grossly intact, nares w/o erythema or drainage, pinna without lesions Eyes: PER, EOMI, sclera nonicteric.  Neck: Supple, no gross masses.  No JVD.  Pulmonary:  Good air movement, no audible wheezing, no use of accessory muscles.  Cardiac: RRR, precordium not hyperdynamic. Vascular:  scattered varicosities present bilaterally.  Moderate venous stasis changes to the legs bilaterally.   2+ soft pitting edema. CEAP [id number]   Vessel Right Left  Radial Palpable Palpable  Gastrointestinal: soft, non-distended. No guarding/no peritoneal signs.  Musculoskeletal: M/S 5/5 throughout.  No deformity.  Neurologic: CN 2-12 intact. Pain and light touch intact in extremities.  Symmetrical.  Speech is fluent. Motor exam as listed above. Psychiatric: Judgment intact, Mood & affect appropriate for pt's clinical situation. Dermatologic: Venous rashes no ulcers noted.  No changes consistent with cellulitis. Lymph : No lichenification or skin changes of chronic lymphedema.  CBC Lab Results  Component Value Date   WBC 4.0 10/06/2022   HGB 14.7 10/06/2022   HCT 44.9 10/06/2022   MCV 95.1 10/06/2022   PLT 182 10/06/2022    BMET    Component Value Date/Time   NA 141 10/06/2022 1135   K 4.7 10/06/2022 1135  CL 107 10/06/2022 1135   CO2 27 10/06/2022 1135   GLUCOSE 85 10/06/2022 1135   BUN 16 10/06/2022 1135   CREATININE 1.12 10/06/2022 1135   CALCIUM 9.1 10/06/2022 1135   GFRNONAA >60 10/06/2022 1135   GFRAA >60 09/08/2017 2300   Estimated Creatinine Clearance: 37.7 mL/min (by C-G formula based on SCr of 1.12 mg/dL).  COAG Lab Results  Component Value Date   INR 1.3 (H) 10/06/2022    Radiology CT Head Wo Contrast  Result Date: 10/06/2022 CLINICAL DATA:  Provided history: Head trauma, minor. Neck trauma. Fall EXAM: CT HEAD WITHOUT CONTRAST CT CERVICAL SPINE WITHOUT CONTRAST TECHNIQUE: Multidetector CT imaging of the head and cervical spine was performed following the standard protocol without intravenous contrast. Multiplanar CT image reconstructions of the cervical spine were also generated. RADIATION DOSE REDUCTION: This exam was performed according to the departmental dose-optimization program which includes automated exposure control, adjustment of the mA and/or kV according to patient size and/or use of iterative reconstruction technique. COMPARISON:  Cervical spine  radiographs 06/19/2013. FINDINGS: CT HEAD FINDINGS Brain: Moderate generalized cerebral atrophy. Patchy and ill-defined hypoattenuation within the cerebral white matter, nonspecific but compatible with advanced chronic small vessel ischemic disease. There is no acute intracranial hemorrhage. No demarcated cortical infarct. No extra-axial fluid collection. No evidence of an intracranial mass. No midline shift. Vascular: No hyperdense vessel.  Atherosclerotic calcifications. Skull: No fracture or aggressive osseous lesion. Sinuses/Orbits: No mass or acute finding within the imaged orbits. Mild mucosal thickening within the left maxillary sinus ostium and left ethmoid infundibulum. CT CERVICAL SPINE FINDINGS Alignment: Slight grade 1 anterolisthesis at C4-C5 and C5-C6. 2 mm grade 1 anterolisthesis at C7-T1. Skull base and vertebrae: The basion-dental and atlanto-dental intervals are maintained.No evidence of acute fracture to the cervical spine. Facet ankylosis on the right at C2-C3 and on the left at C5-C6. Soft tissues and spinal canal: No prevertebral fluid or swelling. No visible canal hematoma. Disc levels: Cervical spondylosis with multilevel disc space narrowing, disc bulges, posterior disc osteophyte complexes, uncovertebral hypertrophy and facet arthrosis. Disc space narrowing is advanced at C5-C6, C6-C7, C7-T1 and T1-T2. No appreciable high-grade spinal canal stenosis. Multilevel bony neural foraminal narrowing. Upper chest: No consolidation within the imaged lung apices. No visible pneumothorax. Right apical paraseptal emphysema. IMPRESSION: CT head: 1.  No evidence of an acute intracranial abnormality. 2. Advanced chronic small vessel ischemic changes within the cerebral white matter. 3. Moderate generalized cerebral atrophy. CT cervical spine: 1. No evidence of an acute fracture to the cervical spine. 2. Mild grade 1 anterolisthesis at C4-C5, C5-C6 and C7-T1. 3. Cervical spondylosis as described. 4. Facet  ankylosis on the right at C2-C3 and on the left at C5-C6. Electronically Signed   By: Jackey Loge D.O.   On: 10/06/2022 12:29   CT Cervical Spine Wo Contrast  Result Date: 10/06/2022 CLINICAL DATA:  Provided history: Head trauma, minor. Neck trauma. Fall EXAM: CT HEAD WITHOUT CONTRAST CT CERVICAL SPINE WITHOUT CONTRAST TECHNIQUE: Multidetector CT imaging of the head and cervical spine was performed following the standard protocol without intravenous contrast. Multiplanar CT image reconstructions of the cervical spine were also generated. RADIATION DOSE REDUCTION: This exam was performed according to the departmental dose-optimization program which includes automated exposure control, adjustment of the mA and/or kV according to patient size and/or use of iterative reconstruction technique. COMPARISON:  Cervical spine radiographs 06/19/2013. FINDINGS: CT HEAD FINDINGS Brain: Moderate generalized cerebral atrophy. Patchy and ill-defined hypoattenuation within the cerebral white matter, nonspecific  but compatible with advanced chronic small vessel ischemic disease. There is no acute intracranial hemorrhage. No demarcated cortical infarct. No extra-axial fluid collection. No evidence of an intracranial mass. No midline shift. Vascular: No hyperdense vessel.  Atherosclerotic calcifications. Skull: No fracture or aggressive osseous lesion. Sinuses/Orbits: No mass or acute finding within the imaged orbits. Mild mucosal thickening within the left maxillary sinus ostium and left ethmoid infundibulum. CT CERVICAL SPINE FINDINGS Alignment: Slight grade 1 anterolisthesis at C4-C5 and C5-C6. 2 mm grade 1 anterolisthesis at C7-T1. Skull base and vertebrae: The basion-dental and atlanto-dental intervals are maintained.No evidence of acute fracture to the cervical spine. Facet ankylosis on the right at C2-C3 and on the left at C5-C6. Soft tissues and spinal canal: No prevertebral fluid or swelling. No visible canal hematoma.  Disc levels: Cervical spondylosis with multilevel disc space narrowing, disc bulges, posterior disc osteophyte complexes, uncovertebral hypertrophy and facet arthrosis. Disc space narrowing is advanced at C5-C6, C6-C7, C7-T1 and T1-T2. No appreciable high-grade spinal canal stenosis. Multilevel bony neural foraminal narrowing. Upper chest: No consolidation within the imaged lung apices. No visible pneumothorax. Right apical paraseptal emphysema. IMPRESSION: CT head: 1.  No evidence of an acute intracranial abnormality. 2. Advanced chronic small vessel ischemic changes within the cerebral white matter. 3. Moderate generalized cerebral atrophy. CT cervical spine: 1. No evidence of an acute fracture to the cervical spine. 2. Mild grade 1 anterolisthesis at C4-C5, C5-C6 and C7-T1. 3. Cervical spondylosis as described. 4. Facet ankylosis on the right at C2-C3 and on the left at C5-C6. Electronically Signed   By: Jackey Loge D.O.   On: 10/06/2022 12:29     Assessment/Plan There are no diagnoses linked to this encounter.   Levora Dredge, MD  10/17/2022 3:53 PM

## 2022-10-18 ENCOUNTER — Encounter (INDEPENDENT_AMBULATORY_CARE_PROVIDER_SITE_OTHER): Payer: Self-pay | Admitting: Vascular Surgery

## 2022-10-20 ENCOUNTER — Other Ambulatory Visit: Payer: Self-pay | Admitting: Family Medicine

## 2022-11-07 ENCOUNTER — Ambulatory Visit (INDEPENDENT_AMBULATORY_CARE_PROVIDER_SITE_OTHER): Payer: Medicare Other | Admitting: Family Medicine

## 2022-11-07 ENCOUNTER — Encounter: Payer: Self-pay | Admitting: Family Medicine

## 2022-11-07 VITALS — BP 124/76 | HR 63 | Temp 97.4°F | Ht 64.0 in | Wt 175.2 lb

## 2022-11-07 DIAGNOSIS — J310 Chronic rhinitis: Secondary | ICD-10-CM

## 2022-11-07 DIAGNOSIS — M1711 Unilateral primary osteoarthritis, right knee: Secondary | ICD-10-CM

## 2022-11-07 DIAGNOSIS — J449 Chronic obstructive pulmonary disease, unspecified: Secondary | ICD-10-CM

## 2022-11-07 DIAGNOSIS — Z86718 Personal history of other venous thrombosis and embolism: Secondary | ICD-10-CM

## 2022-11-07 DIAGNOSIS — R2681 Unsteadiness on feet: Secondary | ICD-10-CM

## 2022-11-07 DIAGNOSIS — I872 Venous insufficiency (chronic) (peripheral): Secondary | ICD-10-CM | POA: Diagnosis not present

## 2022-11-07 MED ORDER — AZELASTINE HCL 0.1 % NA SOLN: 1.0000 | Freq: Two times a day (BID) | NASAL | 0 refills | Status: DC

## 2022-11-07 NOTE — Assessment & Plan Note (Signed)
L>R after LLE DVT 05/2022.  Tender spider veins. Discussed compression stocking use especially around prolonged travel.

## 2022-11-07 NOTE — Progress Notes (Signed)
Ph: [phone] Fax: [phone]   Patient ID: Joseph Osborn, male    DOB: 06/16/1925, 89 y.o.   MRN: [id number]  This visit was conducted in person.  BP 124/76   Pulse 63   Temp (!) 97.4 F (36.3 C) (Temporal)   Ht 5\' 4"  (1.626 m)   Wt 175 lb 4 oz (79.5 kg)   SpO2 96%   BMI 30.08 kg/m    CC: 4 wk f/u visit  Subjective:   HPI: Joseph Osborn is a 89 y.o. male presenting on 11/07/2022 for Medical Management of Chronic Issues (Here for 4-6 wk f/u. Pt accompanied by daughter, [name]. )   See prior note for details.  Saw Dr Gilda Crease [facility] 10/19/2022 - reassuring eval with vascular ultrasound showing resolution of prior LLE blood clot. He therefore was able to come off eliquis early (completed 4+ months). Notes dizziness has improved off eliquis.   Notes ongoing R knee pain - regularly wears brace. He uses aloe gel regularly with benefit.  Ongoing rhinorrhea. Did not tolerate atrovent nasal spray due to tinnitus. Did not respond to oral antihistamines or flonase nasal spray.      Relevant past medical, surgical, family and social history reviewed and updated as indicated. Interim medical history since our last visit reviewed. Allergies and medications reviewed and updated. Outpatient Medications Prior to Visit  Medication Sig Dispense Refill   acetaminophen (TYLENOL) 500 MG tablet Take 2 tablets (1,000 mg total) by mouth in the morning, at noon, and at bedtime.     atenolol (TENORMIN) 50 MG tablet Take 1 tablet (50 mg total) by mouth daily. 90 tablet 3   Cholecalciferol (VITAMIN D3) 25 MCG (1000 UT) capsule Take 1 capsule (1,000 Units total) by mouth daily. 30 capsule    fluticasone furoate-vilanterol (BREO ELLIPTA) 200-25 MCG/ACT AEPB Inhale 1 puff into the lungs daily. 28 each 3   isosorbide mononitrate (IMDUR) 30 MG 24 hr tablet Take 30 mg by mouth daily.     linaclotide (LINZESS) 72 MCG capsule Take 1 capsule (72 mcg total) by mouth every other day. 45 capsule 1    No facility-administered medications prior to visit.     Per HPI unless specifically indicated in ROS section below Review of Systems  Objective:  BP 124/76   Pulse 63   Temp (!) 97.4 F (36.3 C) (Temporal)   Ht 5\' 4"  (1.626 m)   Wt 175 lb 4 oz (79.5 kg)   SpO2 96%   BMI 30.08 kg/m   Wt Readings from Last 3 Encounters:  11/07/22 175 lb 4 oz (79.5 kg)  10/17/22 176 lb 9.6 oz (80.1 kg)  10/10/22 177 lb (80.3 kg)      Physical Exam Vitals and nursing note reviewed.  Constitutional:      Appearance: Normal appearance. He is not ill-appearing.  Cardiovascular:     Rate and Rhythm: Normal rate and regular rhythm.     Pulses: Normal pulses.     Heart sounds: Normal heart sounds. No murmur heard. Pulmonary:     Effort: Pulmonary effort is normal. No respiratory distress.     Breath sounds: Normal breath sounds. No wheezing, rhonchi or rales.  Musculoskeletal:        General: Tenderness (tender spider vein L medial lower leg) present.     Right lower leg: Edema (1+ distal extremities) present.     Left lower leg: Edema (1+ distal extremities) present.     Comments:  FROM  bilateral knees No pain to palpation at joint line  Skin:    General: Skin is warm and dry.     Findings: No rash.  Neurological:     Mental Status: He is alert.  Psychiatric:        Mood and Affect: Mood normal.        Behavior: Behavior normal.       Results for orders placed or performed during the hospital encounter of 10/06/22  CBC with Differential  Result Value Ref Range   WBC 4.0 4.0 - 10.5 K/uL   RBC 4.72 4.22 - 5.81 MIL/uL   Hemoglobin 14.7 13.0 - 17.0 g/dL   HCT 44.0 10.2 - 72.5 %   MCV 95.1 80.0 - 100.0 fL   MCH 31.1 26.0 - 34.0 pg   MCHC 32.7 30.0 - 36.0 g/dL   RDW 36.6 44.0 - 34.7 %   Platelets 182 150 - 400 K/uL   nRBC 0.0 0.0 - 0.2 %   Neutrophils Relative % 48 %   Neutro Abs 1.9 1.7 - 7.7 K/uL   Lymphocytes Relative 37 %   Lymphs Abs 1.5 0.7 - 4.0 K/uL   Monocytes  Relative 7 %   Monocytes Absolute 0.3 0.1 - 1.0 K/uL   Eosinophils Relative 5 %   Eosinophils Absolute 0.2 0.0 - 0.5 K/uL   Basophils Relative 2 %   Basophils Absolute 0.1 0.0 - 0.1 K/uL   Immature Granulocytes 1 %   Abs Immature Granulocytes 0.02 0.00 - 0.07 K/uL  Comprehensive metabolic panel  Result Value Ref Range   Sodium 141 135 - 145 mmol/L   Potassium 4.7 3.5 - 5.1 mmol/L   Chloride 107 98 - 111 mmol/L   CO2 27 22 - 32 mmol/L   Glucose, Bld 85 70 - 99 mg/dL   BUN 16 8 - 23 mg/dL   Creatinine, Ser 4.25 0.61 - 1.24 mg/dL   Calcium 9.1 8.9 - 95.6 mg/dL   Total Protein 7.2 6.5 - 8.1 g/dL   Albumin 4.1 3.5 - 5.0 g/dL   AST 30 15 - 41 U/L   ALT 22 0 - 44 U/L   Alkaline Phosphatase 73 38 - 126 U/L   Total Bilirubin 0.8 0.3 - 1.2 mg/dL   GFR, Estimated >38 >75 mL/min   Anion gap 7 5 - 15  Protime-INR  Result Value Ref Range   Prothrombin Time 16.6 (H) 11.4 - 15.2 seconds   INR 1.3 (H) 0.8 - 1.2  APTT  Result Value Ref Range   aPTT 33 24 - 36 seconds    Assessment & Plan:   Problem List Items Addressed This Visit     COPD (chronic obstructive pulmonary disease) (HCC)    Tolerating breo. Encouraged continued use. Discussed rinsing mouth after each use.       Relevant Medications   azelastine (ASTELIN) 0.1 % nasal spray   Chronic rhinitis    Ongoing, bothersome - trial astelin nasal spray.       Primary osteoarthritis of right knee    Continue knee brace use to R knee. Not interested in surgery.       General unsteadiness    This seems improved off eliquis.       History of deep vein thrombosis (DVT) of lower extremity    Rpt venous US 10/2022 with resolution of previous occlusive LLE DVT. Completed 4 months of eliquis treatment, now off anticoagulation Discussed compression stocking use.  Chronic venous insufficiency - Primary    L>R after LLE DVT 05/2022.  Tender spider veins. Discussed compression stocking use especially around prolonged travel.          Meds ordered this encounter  Medications   azelastine (ASTELIN) 0.1 % nasal spray    Sig: Place 1-2 sprays into both nostrils 2 (two) times daily. Use in each nostril as directed    Dispense:  30 mL    Refill:  0    No orders of the defined types were placed in this encounter.   Patient Instructions  Puede usar voltaren gel a la rodilla derecha.  Trate Astelin nasal spray para nariz.   Use medias de compresion especialmente para la pierna izquierda.  Regresar en octubre para proxima cita.   Follow up plan: Return if symptoms worsen or fail to improve.  Eustaquio Boyden, MD

## 2022-11-07 NOTE — Assessment & Plan Note (Deleted)
x

## 2022-11-07 NOTE — Assessment & Plan Note (Signed)
Tolerating breo. Encouraged continued use. Discussed rinsing mouth after each use.

## 2022-11-07 NOTE — Assessment & Plan Note (Addendum)
Ongoing, bothersome - trial astelin nasal spray.

## 2022-11-07 NOTE — Patient Instructions (Addendum)
Puede usar voltaren gel a la rodilla derecha.  Trate Astelin nasal spray para nariz.   Use medias de compresion especialmente para la pierna izquierda.  Regresar en octubre para proxima cita.

## 2022-11-07 NOTE — Assessment & Plan Note (Signed)
Continue knee brace use to R knee. Not interested in surgery.

## 2022-11-07 NOTE — Assessment & Plan Note (Signed)
This seems improved off eliquis.

## 2022-11-07 NOTE — Assessment & Plan Note (Addendum)
Rpt venous US 10/2022 with resolution of previous occlusive LLE DVT. Completed 4 months of eliquis treatment, now off anticoagulation Discussed compression stocking use.

## 2022-11-14 ENCOUNTER — Ambulatory Visit (INDEPENDENT_AMBULATORY_CARE_PROVIDER_SITE_OTHER): Payer: Medicare Other | Admitting: Vascular Surgery

## 2022-11-14 ENCOUNTER — Encounter (INDEPENDENT_AMBULATORY_CARE_PROVIDER_SITE_OTHER): Payer: Medicare Other

## 2022-11-28 ENCOUNTER — Encounter (INDEPENDENT_AMBULATORY_CARE_PROVIDER_SITE_OTHER): Payer: Medicare Other

## 2022-11-28 ENCOUNTER — Ambulatory Visit (INDEPENDENT_AMBULATORY_CARE_PROVIDER_SITE_OTHER): Payer: Medicare Other | Admitting: Vascular Surgery

## 2022-12-07 ENCOUNTER — Ambulatory Visit: Payer: Medicare Other | Admitting: Nurse Practitioner

## 2022-12-07 ENCOUNTER — Inpatient Hospital Stay
Admit: 2022-12-07 | Discharge: 2022-12-07 | Disposition: A | Discharge status: Home / Self Care | Payer: Medicare Other | Attending: Internal Medicine | Admitting: Internal Medicine

## 2022-12-07 ENCOUNTER — Other Ambulatory Visit: Payer: Self-pay

## 2022-12-07 ENCOUNTER — Emergency Department: Payer: Medicare Other

## 2022-12-07 ENCOUNTER — Inpatient Hospital Stay
Admission: EM | Admit: 2022-12-07 | Discharge: 2022-12-09 | DRG: 176 | Disposition: A | Discharge status: Home / Self Care | Payer: Medicare Other | Attending: Internal Medicine | Admitting: Internal Medicine

## 2022-12-07 DIAGNOSIS — Z888 Allergy status to other drugs, medicaments and biological substances status: Secondary | ICD-10-CM | POA: Diagnosis not present

## 2022-12-07 DIAGNOSIS — E039 Hypothyroidism, unspecified: Secondary | ICD-10-CM | POA: Diagnosis present

## 2022-12-07 DIAGNOSIS — Z9049 Acquired absence of other specified parts of digestive tract: Secondary | ICD-10-CM

## 2022-12-07 DIAGNOSIS — R296 Repeated falls: Secondary | ICD-10-CM | POA: Diagnosis present

## 2022-12-07 DIAGNOSIS — Z79899 Other long term (current) drug therapy: Secondary | ICD-10-CM

## 2022-12-07 DIAGNOSIS — Y92009 Unspecified place in unspecified non-institutional (private) residence as the place of occurrence of the external cause: Secondary | ICD-10-CM

## 2022-12-07 DIAGNOSIS — N400 Enlarged prostate without lower urinary tract symptoms: Secondary | ICD-10-CM | POA: Diagnosis present

## 2022-12-07 DIAGNOSIS — I252 Old myocardial infarction: Secondary | ICD-10-CM | POA: Diagnosis not present

## 2022-12-07 DIAGNOSIS — W1830XA Fall on same level, unspecified, initial encounter: Secondary | ICD-10-CM | POA: Diagnosis present

## 2022-12-07 DIAGNOSIS — Z800 Family history of malignant neoplasm of digestive organs: Secondary | ICD-10-CM

## 2022-12-07 DIAGNOSIS — Z9079 Acquired absence of other genital organ(s): Secondary | ICD-10-CM

## 2022-12-07 DIAGNOSIS — Z91013 Allergy to seafood: Secondary | ICD-10-CM | POA: Diagnosis not present

## 2022-12-07 DIAGNOSIS — Z66 Do not resuscitate: Secondary | ICD-10-CM | POA: Diagnosis present

## 2022-12-07 DIAGNOSIS — Z86718 Personal history of other venous thrombosis and embolism: Secondary | ICD-10-CM

## 2022-12-07 DIAGNOSIS — R42 Dizziness and giddiness: Secondary | ICD-10-CM | POA: Diagnosis present

## 2022-12-07 DIAGNOSIS — Z8249 Family history of ischemic heart disease and other diseases of the circulatory system: Secondary | ICD-10-CM

## 2022-12-07 DIAGNOSIS — R109 Unspecified abdominal pain: Secondary | ICD-10-CM | POA: Diagnosis present

## 2022-12-07 DIAGNOSIS — N1330 Unspecified hydronephrosis: Secondary | ICD-10-CM | POA: Diagnosis present

## 2022-12-07 DIAGNOSIS — E669 Obesity, unspecified: Secondary | ICD-10-CM | POA: Diagnosis present

## 2022-12-07 DIAGNOSIS — N135 Crossing vessel and stricture of ureter without hydronephrosis: Secondary | ICD-10-CM

## 2022-12-07 DIAGNOSIS — I25119 Atherosclerotic heart disease of native coronary artery with unspecified angina pectoris: Secondary | ICD-10-CM

## 2022-12-07 DIAGNOSIS — I2699 Other pulmonary embolism without acute cor pulmonale: Secondary | ICD-10-CM

## 2022-12-07 DIAGNOSIS — Z7951 Long term (current) use of inhaled steroids: Secondary | ICD-10-CM

## 2022-12-07 DIAGNOSIS — J449 Chronic obstructive pulmonary disease, unspecified: Secondary | ICD-10-CM | POA: Diagnosis present

## 2022-12-07 DIAGNOSIS — I129 Hypertensive chronic kidney disease with stage 1 through stage 4 chronic kidney disease, or unspecified chronic kidney disease: Secondary | ICD-10-CM | POA: Diagnosis present

## 2022-12-07 DIAGNOSIS — N1831 Chronic kidney disease, stage 3a: Secondary | ICD-10-CM | POA: Diagnosis present

## 2022-12-07 DIAGNOSIS — Z9842 Cataract extraction status, left eye: Secondary | ICD-10-CM | POA: Diagnosis not present

## 2022-12-07 DIAGNOSIS — Z955 Presence of coronary angioplasty implant and graft: Secondary | ICD-10-CM | POA: Diagnosis not present

## 2022-12-07 DIAGNOSIS — N183 Chronic kidney disease, stage 3 unspecified: Secondary | ICD-10-CM | POA: Diagnosis present

## 2022-12-07 DIAGNOSIS — W19XXXA Unspecified fall, initial encounter: Secondary | ICD-10-CM | POA: Diagnosis not present

## 2022-12-07 DIAGNOSIS — Z85828 Personal history of other malignant neoplasm of skin: Secondary | ICD-10-CM | POA: Diagnosis not present

## 2022-12-07 DIAGNOSIS — I2510 Atherosclerotic heart disease of native coronary artery without angina pectoris: Secondary | ICD-10-CM | POA: Diagnosis present

## 2022-12-07 DIAGNOSIS — Z8616 Personal history of COVID-19: Secondary | ICD-10-CM

## 2022-12-07 DIAGNOSIS — Z9841 Cataract extraction status, right eye: Secondary | ICD-10-CM | POA: Diagnosis not present

## 2022-12-07 DIAGNOSIS — Z7901 Long term (current) use of anticoagulants: Secondary | ICD-10-CM

## 2022-12-07 DIAGNOSIS — Z6830 Body mass index (BMI) 30.0-30.9, adult: Secondary | ICD-10-CM

## 2022-12-07 DIAGNOSIS — Z961 Presence of intraocular lens: Secondary | ICD-10-CM | POA: Diagnosis present

## 2022-12-07 DIAGNOSIS — N182 Chronic kidney disease, stage 2 (mild): Secondary | ICD-10-CM | POA: Diagnosis present

## 2022-12-07 DIAGNOSIS — K5909 Other constipation: Secondary | ICD-10-CM | POA: Diagnosis not present

## 2022-12-07 DIAGNOSIS — Z86711 Personal history of pulmonary embolism: Secondary | ICD-10-CM | POA: Diagnosis present

## 2022-12-07 HISTORY — DX: Other pulmonary embolism without acute cor pulmonale: I26.99

## 2022-12-07 LAB — CBC
HCT: 45.1 % (ref 39.0–52.0)
Hemoglobin: 14.8 g/dL (ref 13.0–17.0)
MCH: 30.8 pg (ref 26.0–34.0)
MCHC: 32.8 g/dL (ref 30.0–36.0)
MCV: 93.8 fL (ref 80.0–100.0)
Platelets: 154 10*3/uL (ref 150–400)
RBC: 4.81 MIL/uL (ref 4.22–5.81)
RDW: 12.5 % (ref 11.5–15.5)
WBC: 5.9 10*3/uL (ref 4.0–10.5)
nRBC: 0 % (ref 0.0–0.2)

## 2022-12-07 LAB — BASIC METABOLIC PANEL
Anion gap: 7 (ref 5–15)
BUN: 17 mg/dL (ref 8–23)
CO2: 25 mmol/L (ref 22–32)
Calcium: 8.7 mg/dL — ABNORMAL LOW (ref 8.9–10.3)
Chloride: 102 mmol/L (ref 98–111)
Creatinine, Ser: 1.03 mg/dL (ref 0.61–1.24)
GFR, Estimated: >60 mL/min (ref >60)
Glucose, Bld: 111 mg/dL — ABNORMAL HIGH (ref 70–99)
Potassium: 4.7 mmol/L (ref 3.5–5.1)
Sodium: 134 mmol/L — ABNORMAL LOW (ref 135–145)

## 2022-12-07 LAB — URINALYSIS, W/ REFLEX TO CULTURE (INFECTION SUSPECTED)
Bacteria, UA: NONE SEEN
Bilirubin Urine: NEGATIVE
Glucose, UA: NEGATIVE mg/dL
Hgb urine dipstick: NEGATIVE
Ketones, ur: NEGATIVE mg/dL
Nitrite: NEGATIVE
Protein, ur: NEGATIVE mg/dL
Specific Gravity, Urine: 1.034 — ABNORMAL HIGH (ref 1.005–1.030)
pH: 5 (ref 5.0–8.0)

## 2022-12-07 LAB — ECHOCARDIOGRAM COMPLETE
Height: 64 in
Weight: 2800 oz

## 2022-12-07 LAB — BRAIN NATRIURETIC PEPTIDE: B Natriuretic Peptide: 92.6 pg/mL (ref 0.0–100.0)

## 2022-12-07 LAB — APTT: aPTT: 27 seconds (ref 24–36)

## 2022-12-07 LAB — PROTIME-INR
INR: 1.1 (ref 0.8–1.2)
Prothrombin Time: 14.6 seconds (ref 11.4–15.2)

## 2022-12-07 LAB — TROPONIN I (HIGH SENSITIVITY)
Troponin I (High Sensitivity): 5 ng/L (ref <18)
Troponin I (High Sensitivity): 6 ng/L (ref <18)

## 2022-12-07 MED ORDER — LINACLOTIDE 72 MCG PO CAPS
72.0000 ug | ORAL_CAPSULE | Freq: Every day | ORAL | Status: DC
  Administered 2022-12-09: 72 ug via ORAL
  Filled 2022-12-07 (×2): qty 1

## 2022-12-07 MED ORDER — HYDROMORPHONE HCL 1 MG/ML IJ SOLN: 0.5000 mg | INTRAMUSCULAR | Status: DC | PRN

## 2022-12-07 MED ORDER — HEPARIN BOLUS VIA INFUSION
4500.0000 [IU] | Freq: Once | INTRAVENOUS | Status: AC
  Administered 2022-12-07: 4500 [IU] via INTRAVENOUS
  Filled 2022-12-07: qty 4500

## 2022-12-07 MED ORDER — HEPARIN (PORCINE) 25000 UT/250ML-% IV SOLN
1100.0000 [IU]/h | INTRAVENOUS | Status: DC
  Administered 2022-12-07: 1200 [IU]/h via INTRAVENOUS
  Administered 2022-12-08 – 2022-12-09 (×2): 1100 [IU]/h via INTRAVENOUS
  Filled 2022-12-07 (×3): qty 250

## 2022-12-07 MED ORDER — ACETAMINOPHEN 650 MG RE SUPP: 650.0000 mg | Freq: Four times a day (QID) | RECTAL | Status: DC | PRN

## 2022-12-07 MED ORDER — LIDOCAINE 5 % EX PTCH
1.0000 | MEDICATED_PATCH | CUTANEOUS | Status: DC
  Administered 2022-12-07 – 2022-12-09 (×2): 1 via TRANSDERMAL
  Filled 2022-12-07 (×2): qty 1

## 2022-12-07 MED ORDER — ONDANSETRON HCL 4 MG PO TABS: 4.0000 mg | ORAL_TABLET | Freq: Four times a day (QID) | ORAL | Status: DC | PRN

## 2022-12-07 MED ORDER — ONDANSETRON HCL 4 MG/2ML IJ SOLN
4.0000 mg | Freq: Four times a day (QID) | INTRAMUSCULAR | Status: DC | PRN
  Administered 2022-12-09: 4 mg via INTRAVENOUS
  Filled 2022-12-07: qty 2

## 2022-12-07 MED ORDER — ACETAMINOPHEN 325 MG PO TABS
650.0000 mg | ORAL_TABLET | Freq: Four times a day (QID) | ORAL | Status: DC | PRN
  Administered 2022-12-07: 650 mg via ORAL
  Filled 2022-12-07: qty 2

## 2022-12-07 MED ORDER — SODIUM CHLORIDE 0.9% FLUSH
3.0000 mL | Freq: Two times a day (BID) | INTRAVENOUS | Status: DC
  Administered 2022-12-07 – 2022-12-08 (×2): 3 mL via INTRAVENOUS

## 2022-12-07 MED ORDER — SODIUM CHLORIDE 0.9 % IV BOLUS
500.0000 mL | Freq: Once | INTRAVENOUS | Status: AC
  Administered 2022-12-07: 500 mL via INTRAVENOUS

## 2022-12-07 MED ORDER — IOHEXOL 350 MG/ML SOLN
100.0000 mL | Freq: Once | INTRAVENOUS | Status: AC | PRN
  Administered 2022-12-07: 100 mL via INTRAVENOUS

## 2022-12-07 MED ORDER — HYDROCODONE-ACETAMINOPHEN 5-325 MG PO TABS
1.0000 | ORAL_TABLET | Freq: Four times a day (QID) | ORAL | Status: DC | PRN
  Administered 2022-12-07 – 2022-12-08 (×2): 1 via ORAL
  Filled 2022-12-07 (×2): qty 1

## 2022-12-07 MED ORDER — ADULT MULTIVITAMIN W/MINERALS CH
1.0000 | ORAL_TABLET | Freq: Every day | ORAL | Status: DC
  Administered 2022-12-08 – 2022-12-09 (×2): 1 via ORAL
  Filled 2022-12-07 (×2): qty 1

## 2022-12-07 MED ORDER — FLUTICASONE FUROATE-VILANTEROL 200-25 MCG/ACT IN AEPB
1.0000 | INHALATION_SPRAY | Freq: Every day | RESPIRATORY_TRACT | Status: DC
  Administered 2022-12-07 – 2022-12-09 (×2): 1 via RESPIRATORY_TRACT
  Filled 2022-12-07: qty 28

## 2022-12-07 MED ORDER — POLYETHYLENE GLYCOL 3350 17 G PO PACK: 17.0000 g | PACK | Freq: Every day | ORAL | Status: DC | PRN

## 2022-12-07 MED ORDER — FENTANYL CITRATE PF 50 MCG/ML IJ SOSY
50.0000 ug | PREFILLED_SYRINGE | Freq: Once | INTRAMUSCULAR | Status: AC
  Administered 2022-12-07: 50 ug via INTRAVENOUS
  Filled 2022-12-07: qty 1

## 2022-12-07 NOTE — Plan of Care (Signed)

## 2022-12-07 NOTE — Consult Note (Signed)
ANTICOAGULATION CONSULT NOTE  Pharmacy Consult for heparin infusion Indication: pulmonary embolus  Allergies  Allergen Reactions   Shellfish Allergy Anaphylaxis   Oxybutynin Other (See Comments)    Doesn't remember reaction    Patient Measurements: Height: 5\' 4"  (162.6 cm) Weight: 79.4 kg (175 lb) IBW/kg (Calculated) : 59.2 Heparin Dosing Weight: 75.6 kg  Vital Signs: Temp: 99.1 F (37.3 C) (07/31 1156) BP: 125/69 (07/31 1156) Pulse Rate: 84 (07/31 1156)  Labs: Recent Labs    12/07/22 1159  HGB 14.8  HCT 45.1  PLT 154  CREATININE 1.03  TROPONINIHS 5    Estimated Creatinine Clearance: 40.8 mL/min (by C-G formula based on SCr of 1.03 mg/dL).   Medical History: Past Medical History:  Diagnosis Date   Arthritis    Basal cell carcinoma 02/04/2021   L groin, excised 03/23/21   BPH (benign prostatic hyperplasia)    CAD (coronary artery disease)    Chronic kidney disease    Stage III   Community acquired pneumonia 04/21/2021   Post COVID CAP s/p hospitalization   COPD (chronic obstructive pulmonary disease) (HCC)    COVID-19 04/13/2021   DDD (degenerative disc disease), cervical    DVT (deep venous thrombosis) (HCC) 05/30/2022   Left leg   Hypertension    Lipoma 2017   Lower back pain    Myocardial infarction (HCC)    approx 2000   Thyroid disease    Hypothyroidism   Wears dentures    partial upper    Medications:  Patient was on Eliquis for DVT but this was stopped in June.  Assessment: 89 yo male presented to the ED after syncopal event. Patient has history of DVT that was treatment with Eliquis which was stopped in June. CT positive for bilateral pulmonary embolus.  Pharmacy consulted to start heparin infusion.    Goal of Therapy:  Heparin level 0.3-0.7 units/ml Monitor platelets by anticoagulation protocol: Yes   Plan:  Give 4500 units bolus x 1 Start heparin infusion at 1200 units/hr Check anti-Xa level in 8 hours and daily while on  heparin Continue to monitor H&H and platelets  Barrie Folk, PharmD 12/07/2022,3:31 PM

## 2022-12-07 NOTE — Assessment & Plan Note (Signed)
Ground-level fall in the setting of dizziness, likely precipitated by PE  - PT/OT

## 2022-12-07 NOTE — ED Triage Notes (Signed)
Pt to ED for multiple falls within the past week. Reports feeling pain to kidney area. Reports being very dizzy recently.

## 2022-12-07 NOTE — Assessment & Plan Note (Signed)
-   Continue home Linzess, however increased to daily dosing given continued constipation reported

## 2022-12-07 NOTE — Assessment & Plan Note (Signed)
No wheezing on examination and patient declines any shortness of breath  - Continue home bronchodilators

## 2022-12-07 NOTE — Assessment & Plan Note (Addendum)
Per chart review, patient has a history of CAD with distant MI in 2002 with stenting.  He follows with Dr. Juliann Pares.  Last echocardiogram in 2023 with normal biventricular function and no regional wall motion abnormalities.  Last stress test in 2019 with normal myocardial perfusion.  Patient has been intolerant of statin therapy.  - Continue Aspirin - Hold home Imdur and atenolol due to high risk for hypotension 81 mg daily

## 2022-12-07 NOTE — H&P (Addendum)
History and Physical    Patient: Joseph Osborn FTD:[id number] DOB: 09-23-1925 DOA: 12/07/2022 DOS: the patient was seen and examined on 12/07/2022 PCP: Eustaquio Boyden, MD  Patient coming from: Home  Chief Complaint:  Chief Complaint  Patient presents with   Fall   HPI: Joseph Osborn is a 89 y.o. 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  Joseph Osborn states he has been experiencing intermittent dizziness for a couple months now that he attribute it to Eliquis.  Due to this, he stopped Eliquis approximately 1 month ago.  He states that the dizziness both occurs when standing and occasionally when he is already been standing for a while.  Today, he had a bout of dizziness where he felt the room spinning.  This happened when he was standing and washing his hands.  He fell onto the toilet, hitting the toilet with his left chest and flank area.  He is experiencing significant pain in that region.  At this time, he denies any additional episodes of dizziness, shortness of breath, substernal chest pain, palpitations.  He endorses bilateral lower rib pain when taking deep breaths.  ED course: On arrival to the ED, patient was normotensive at 125/69 with heart rate of 73.  He was saturating at 100% on room air.  He was afebrile at 99.1.  His respiratory rate was 18/minute. Initial blood work notable for normal CBC, and BMP with sodium of 134, glucose 111, creatinine 1.03 with GFR above 60.  BNP and troponin are within normal limits.  CT of the head was obtained with no acute intracranial abnormality.  CTA of the chest was notable for bilateral central and segmental PE with evidence of right heart strain consistent with at least submassive PE, in addition to stable soft mural thrombus in the distal aortic  arch and bilateral peripheral wedge-shaped consolidations concerning for infarct versus pneumonia.  CT of the abdomen was obtained that demonstrated prominent left-sided hydronephrosis and hydro ureter with abrupt change in caliber at the distal left ureter just proximal to the left UVJ.  Patient started on IV heparin and TRH contacted for admission.  Review of Systems: As mentioned in the history of present illness. All other systems reviewed and are negative.  Past Medical History:  Diagnosis Date   Arthritis    Basal cell carcinoma 02/04/2021   L groin, excised 03/23/21   BPH (benign prostatic hyperplasia)    CAD (coronary artery disease)    Chronic kidney disease    Stage III   Community acquired pneumonia 04/21/2021   Post COVID CAP s/p hospitalization   COPD (chronic obstructive pulmonary disease) (HCC)    COVID-19 04/13/2021   DDD (degenerative disc disease), cervical    DVT (deep venous thrombosis) (HCC) 05/30/2022   Left leg   Hypertension    Lipoma 2017   Lower back pain    Myocardial infarction (HCC)    approx 2000   Thyroid disease    Hypothyroidism   Wears dentures    partial upper   Past Surgical History:  Procedure Laterality Date   BLADDER SURGERY  03/2008   bladder polyp removed [name])   CARDIAC CATHETERIZATION  2000   1 stent placed after MI   CATARACT EXTRACTION W/ INTRAOCULAR LENS  IMPLANT, BILATERAL  2014   [facility], Dr. Druscilla Brownie  CATARACT EXTRACTION W/PHACO Left 09/20/2022   Procedure: CATARACT EXTRACTION PHACO AND INTRAOCULAR LENS PLACEMENT (IOC) LEFT  9.43  00:57.3;  Surgeon: Galen Manila, MD;  Location: [facility];  Service: Ophthalmology;  Laterality: Left;   CHOLECYSTECTOMY     In [place]   COLONOSCOPY  03/13/2014   [facility], Dr. Mechele Collin   COLONOSCOPY WITH PROPOFOL N/A 03/03/2016   Procedure: COLONOSCOPY WITH PROPOFOL;  Surgeon: Midge Minium, MD;  Location: [facility];  Service: Endoscopy;  Laterality: N/A;    ESOPHAGOGASTRODUODENOSCOPY (EGD) WITH PROPOFOL N/A 03/03/2016   Procedure: ESOPHAGOGASTRODUODENOSCOPY (EGD) WITH PROPOFOL;  Surgeon: Midge Minium, MD;  Location: [facility];  Service: Endoscopy;  Laterality: N/A;  Interpreter needed   HEMORRHOID SURGERY     [place], [place]   TRANSURETHRAL RESECTION OF PROSTATE  03/24/2008   Dr Sheppard Penton, [facility]   Social History:  reports that he has never smoked. He has never used smokeless tobacco. He reports that he does not currently use alcohol. He reports that he does not use drugs.  Allergies  Allergen Reactions   Shellfish Allergy Anaphylaxis   Oxybutynin Other (See Comments)    Doesn't remember reaction    Family History  Problem Relation Age of Onset   Heart disease Mother    Heart attack Mother    Liver cancer Cousin    Diabetes Neg Hx     Prior to Admission medications   Medication Sig Start Date End Date Taking? Authorizing Provider  acetaminophen (TYLENOL) 500 MG tablet Take 2 tablets (1,000 mg total) by mouth in the morning, at noon, and at bedtime. 02/14/22   Eustaquio Boyden, MD  atenolol (TENORMIN) 50 MG tablet Take 1 tablet (50 mg total) by mouth daily. 08/10/22   Eustaquio Boyden, MD  azelastine (ASTELIN) 0.1 % nasal spray Place 1-2 sprays into both nostrils 2 (two) times daily. Use in each nostril as directed 11/07/22   Eustaquio Boyden, MD  Cholecalciferol (VITAMIN D3) 25 MCG (1000 UT) capsule Take 1 capsule (1,000 Units total) by mouth daily. 08/15/22   Eustaquio Boyden, MD  fluticasone furoate-vilanterol (BREO ELLIPTA) 200-25 MCG/ACT AEPB Inhale 1 puff into the lungs daily. 10/12/22   Eustaquio Boyden, MD  isosorbide mononitrate (IMDUR) 30 MG 24 hr tablet Take 30 mg by mouth daily. 01/07/22   [provider]  linaclotide (LINZESS) 72 MCG capsule Take 1 capsule (72 mcg total) by mouth every other day. 06/10/22   Eustaquio Boyden, MD    Physical Exam: Vitals:   12/07/22 1156 12/07/22 1157 12/07/22 1159 12/07/22  1705  BP: 125/69   (!) 142/69  Pulse: 84   81  Resp: 18   16  Temp: 99.1 F (37.3 C)   98.2 F (36.8 C)  SpO2:   92% 97%  Weight:  79.4 kg    Height:  5\' 4"  (1.626 m)     Physical Exam Vitals and nursing note reviewed.  Constitutional:      General: He is not in acute distress. HENT:     Head: Normocephalic and atraumatic.     Mouth/Throat:     Mouth: Mucous membranes are moist.     Pharynx: Oropharynx is clear.  Eyes:     Conjunctiva/sclera: Conjunctivae normal.     Pupils: Pupils are equal, round, and reactive to light.  Cardiovascular:     Rate and Rhythm: Normal rate and regular rhythm.     Heart sounds: No murmur heard. Pulmonary:     Effort: Pulmonary effort is  normal. No respiratory distress.     Breath sounds: Decreased breath sounds (Left lower) and rales (Right lower) present. No wheezing.  Chest:    Abdominal:     General: Bowel sounds are normal. There is no distension.     Palpations: Abdomen is soft.     Tenderness: There is no abdominal tenderness.  Musculoskeletal:        General: No swelling or tenderness.     Right lower leg: No edema.     Left lower leg: No edema.  Skin:    General: Skin is warm and dry.  Neurological:     Mental Status: He is alert and oriented to person, place, and time. Mental status is at baseline.     Comments: Patient seems forgetful regarding remote history, however otherwise alert and oriented x 4  Psychiatric:        Mood and Affect: Mood normal.        Behavior: Behavior normal.    Data Reviewed: CBC with WBC of 5.9, hemoglobin 14.8, platelets of 154 BMP with sodium of 134, potassium 4.7, bicarb 25, glucose 111, BUN 17, creatinine 1.03 with GFR above 60 BNP 92 Troponin 5  EKG personally reviewed.  Sinus rhythm with rate of 83.  Borderline PR prolongation.  No ST or T wave changes consistent with acute ischemia.  J-point elevation in the lateral leads only.  CT Angio Chest PE W/Cm &/Or Wo Cm  Result Date:  12/07/2022 CLINICAL DATA:  Multiple falls within the last week, dizziness, block left-sided abdominal trauma EXAM: CT ANGIOGRAPHY CHEST CT ABDOMEN AND PELVIS WITH CONTRAST TECHNIQUE: Multidetector CT imaging of the chest was performed using the standard protocol during bolus administration of intravenous contrast. Multiplanar CT image reconstructions and MIPs were obtained to evaluate the vascular anatomy. Multidetector CT imaging of the abdomen and pelvis was performed using the standard protocol during bolus administration of intravenous contrast. RADIATION DOSE REDUCTION: This exam was performed according to the departmental dose-optimization program which includes automated exposure control, adjustment of the mA and/or kV according to patient size and/or use of iterative reconstruction technique. CONTRAST:  OMNIPAQUE IOHEXOL 350 MG/ML SOLN COMPARISON:  09/09/2017, 02/02/2016 FINDINGS: CTA CHEST FINDINGS Cardiovascular: This is a technically adequate evaluation of the pulmonary vasculature. On the left, there are central and segmental pulmonary emboli extending from the left main pulmonary artery into the upper and lower lobe branches. On the right, there are central and segmental right middle and right lower lobe pulmonary emboli. Moderate clot burden, with RV/LV ratio measuring 1.3. Findings are consistent with right heart strain. There is prominent right atrial dilatation as well. No pericardial effusion. No evidence of thoracic aortic aneurysm or dissection. Soft atheromatous plaque is seen within the distal aortic arch and proximal descending thoracic aorta, slightly more pronounced since prior study. There is no flow limiting stenosis within the aorta however. Mediastinum/Nodes: No enlarged mediastinal, hilar, or axillary lymph nodes. Thyroid gland, trachea, and esophagus demonstrate no significant findings. Lungs/Pleura: Peripheral wedge-shaped areas of consolidation are seen within the bilateral  lower lobes, which may be hypoventilatory or related to developing pulmonary infarcts given the associated pulmonary emboli. Continued follow-up is recommended to document resolution. No acute airspace disease, effusion, or pneumothorax. Left lower lobe bronchial wall thickening. Central airways are patent. Musculoskeletal: There are no acute or destructive bony abnormalities. Reconstructed images demonstrate no additional findings. Review of the MIP images confirms the above findings. CT ABDOMEN and PELVIS FINDINGS Hepatobiliary: No focal liver abnormality  is seen. Status post cholecystectomy. No biliary dilatation. Pancreas: Unremarkable. No pancreatic ductal dilatation or surrounding inflammatory changes. Spleen: No splenic injury or perisplenic hematoma. Adrenals/Urinary Tract: There is moderate to severe left hydronephrosis and hydroureter, with abrupt change in caliber involving the distal left ureter just proximal to the UVJ, reference image 70/11. There is no discrete mass or obstructing calculus in this region. Ureteroscopy may be useful for further evaluation. There is normal excretion of contrast identified in the left kidney on delayed imaging. The right kidney is unremarkable. The adrenals and bladder are unremarkable. Stomach/Bowel: No bowel obstruction or ileus. Normal appendix right lower quadrant. No bowel wall thickening or inflammatory change. Vascular/Lymphatic: Aortic atherosclerosis. No enlarged abdominal or pelvic lymph nodes. Reproductive: Stable marked enlargement of the prostate, which measures 5.3 x 6.4 cm. Other: No free fluid or free intraperitoneal gas. There are multiple fat containing midline supraumbilical ventral hernias which are stable since prior study. Stable fat containing inguinal hernias. No bowel herniation. Musculoskeletal: No acute or destructive bony abnormalities. Reconstructed images demonstrate no additional findings. Review of the MIP images confirms the above  findings. IMPRESSION: Chest: 1. Bilateral central and segmental pulmonary emboli, with CT evidence of right heart strain (RV/LV Ratio = 1.3) consistent with at least submassive (intermediate risk) PE. The presence of right heart strain has been associated with an increased risk of morbidity and mortality. Please refer to the "Code PE Focused" order set in EPIC. 2. Stable soft mural thrombus within the distal aortic arch and proximal descending thoracic aorta, without flow limiting stenosis. No aneurysm or dissection. 3. Bibasilar peripheral wedge-shaped consolidation. Differential diagnosis would include developing infarct versus pneumonia. Follow-up is recommended to ensure resolution and exclude underlying neoplasm. Abdomen/pelvis: 1. Prominent left-sided hydronephrosis and hydroureter, with abrupt change in caliber of the distal left ureter just proximal to the left UVJ. I do not see any obstructing mass or calculus, and stenosis at the left UV a could be considered. Ureteroscopy may be useful for further evaluation. 2. Marked enlargement the prostate. 3. Fat containing midline ventral and bilateral inguinal hernias. No bowel herniation. Critical Value/emergent results were called by telephone at the time of interpretation on 12/07/2022 at 3:27 pm to provider [facility] , who verbally acknowledged these results. Electronically Signed   By: Sharlet Salina M.D.   On: 12/07/2022 15:27   CT ABDOMEN PELVIS W CONTRAST  Result Date: 12/07/2022 CLINICAL DATA:  Multiple falls within the last week, dizziness, block left-sided abdominal trauma EXAM: CT ANGIOGRAPHY CHEST CT ABDOMEN AND PELVIS WITH CONTRAST TECHNIQUE: Multidetector CT imaging of the chest was performed using the standard protocol during bolus administration of intravenous contrast. Multiplanar CT image reconstructions and MIPs were obtained to evaluate the vascular anatomy. Multidetector CT imaging of the abdomen and pelvis was performed using the  standard protocol during bolus administration of intravenous contrast. RADIATION DOSE REDUCTION: This exam was performed according to the departmental dose-optimization program which includes automated exposure control, adjustment of the mA and/or kV according to patient size and/or use of iterative reconstruction technique. CONTRAST:  OMNIPAQUE IOHEXOL 350 MG/ML SOLN COMPARISON:  09/09/2017, 02/02/2016 FINDINGS: CTA CHEST FINDINGS Cardiovascular: This is a technically adequate evaluation of the pulmonary vasculature. On the left, there are central and segmental pulmonary emboli extending from the left main pulmonary artery into the upper and lower lobe branches. On the right, there are central and segmental right middle and right lower lobe pulmonary emboli. Moderate clot burden, with RV/LV ratio measuring 1.3. Findings are  consistent with right heart strain. There is prominent right atrial dilatation as well. No pericardial effusion. No evidence of thoracic aortic aneurysm or dissection. Soft atheromatous plaque is seen within the distal aortic arch and proximal descending thoracic aorta, slightly more pronounced since prior study. There is no flow limiting stenosis within the aorta however. Mediastinum/Nodes: No enlarged mediastinal, hilar, or axillary lymph nodes. Thyroid gland, trachea, and esophagus demonstrate no significant findings. Lungs/Pleura: Peripheral wedge-shaped areas of consolidation are seen within the bilateral lower lobes, which may be hypoventilatory or related to developing pulmonary infarcts given the associated pulmonary emboli. Continued follow-up is recommended to document resolution. No acute airspace disease, effusion, or pneumothorax. Left lower lobe bronchial wall thickening. Central airways are patent. Musculoskeletal: There are no acute or destructive bony abnormalities. Reconstructed images demonstrate no additional findings. Review of the MIP images confirms the above  findings. CT ABDOMEN and PELVIS FINDINGS Hepatobiliary: No focal liver abnormality is seen. Status post cholecystectomy. No biliary dilatation. Pancreas: Unremarkable. No pancreatic ductal dilatation or surrounding inflammatory changes. Spleen: No splenic injury or perisplenic hematoma. Adrenals/Urinary Tract: There is moderate to severe left hydronephrosis and hydroureter, with abrupt change in caliber involving the distal left ureter just proximal to the UVJ, reference image 70/11. There is no discrete mass or obstructing calculus in this region. Ureteroscopy may be useful for further evaluation. There is normal excretion of contrast identified in the left kidney on delayed imaging. The right kidney is unremarkable. The adrenals and bladder are unremarkable. Stomach/Bowel: No bowel obstruction or ileus. Normal appendix right lower quadrant. No bowel wall thickening or inflammatory change. Vascular/Lymphatic: Aortic atherosclerosis. No enlarged abdominal or pelvic lymph nodes. Reproductive: Stable marked enlargement of the prostate, which measures 5.3 x 6.4 cm. Other: No free fluid or free intraperitoneal gas. There are multiple fat containing midline supraumbilical ventral hernias which are stable since prior study. Stable fat containing inguinal hernias. No bowel herniation. Musculoskeletal: No acute or destructive bony abnormalities. Reconstructed images demonstrate no additional findings. Review of the MIP images confirms the above findings. IMPRESSION: Chest: 1. Bilateral central and segmental pulmonary emboli, with CT evidence of right heart strain (RV/LV Ratio = 1.3) consistent with at least submassive (intermediate risk) PE. The presence of right heart strain has been associated with an increased risk of morbidity and mortality. Please refer to the "Code PE Focused" order set in EPIC. 2. Stable soft mural thrombus within the distal aortic arch and proximal descending thoracic aorta, without flow limiting  stenosis. No aneurysm or dissection. 3. Bibasilar peripheral wedge-shaped consolidation. Differential diagnosis would include developing infarct versus pneumonia. Follow-up is recommended to ensure resolution and exclude underlying neoplasm. Abdomen/pelvis: 1. Prominent left-sided hydronephrosis and hydroureter, with abrupt change in caliber of the distal left ureter just proximal to the left UVJ. I do not see any obstructing mass or calculus, and stenosis at the left UV a could be considered. Ureteroscopy may be useful for further evaluation. 2. Marked enlargement the prostate. 3. Fat containing midline ventral and bilateral inguinal hernias. No bowel herniation. Critical Value/emergent results were called by telephone at the time of interpretation on 12/07/2022 at 3:27 pm to provider [facility] , who verbally acknowledged these results. Electronically Signed   By: Sharlet Salina M.D.   On: 12/07/2022 15:27   DG Chest 2 View  Result Date: 12/07/2022 CLINICAL DATA:  Multiple falls. EXAM: CHEST - 2 VIEW COMPARISON:  02/21/2022 FINDINGS: The cardio pericardial silhouette is enlarged. Interstitial markings are diffusely coarsened with chronic features. Basilar  atelectasis or scarring with small bilateral pleural effusions. Bones are diffusely demineralized. IMPRESSION: Chronic interstitial coarsening with small bilateral pleural effusions and basilar atelectasis or scarring. Electronically Signed   By: Kennith Center M.D.   On: 12/07/2022 13:01   CT HEAD WO CONTRAST ( )  Result Date: 12/07/2022 CLINICAL DATA:  Multiple falls. EXAM: CT HEAD WITHOUT CONTRAST TECHNIQUE: Contiguous axial images were obtained from the base of the skull through the vertex without intravenous contrast. RADIATION DOSE REDUCTION: This exam was performed according to the departmental dose-optimization program which includes automated exposure control, adjustment of the mA and/or kV according to patient size and/or use of iterative  reconstruction technique. COMPARISON:  10/06/2022 FINDINGS: Brain: There is no evidence for acute hemorrhage, hydrocephalus, mass lesion, or abnormal extra-axial fluid collection. No definite CT evidence for acute infarction. Diffuse loss of parenchymal volume is consistent with atrophy. Patchy low attenuation in the deep hemispheric and periventricular white matter is nonspecific, but likely reflects chronic microvascular ischemic demyelination. Vascular: No hyperdense vessel or unexpected calcification. Skull: No evidence for fracture. No worrisome lytic or sclerotic lesion. Sinuses/Orbits: The visualized paranasal sinuses and mastoid air cells are clear. Visualized portions of the globes and intraorbital fat are unremarkable. Other: None. IMPRESSION: 1. No acute intracranial abnormality. 2. Atrophy with chronic small vessel white matter ischemic disease. Electronically Signed   By: Kennith Center M.D.   On: 12/07/2022 13:01    Results are pending, will review when available.  Assessment and Plan:  * Pulmonary embolism and infarction [facility]) Joseph Osborn is presenting with sudden onset dizziness today with a ground-level fall, found to have at least submassive PE.  On CTA, clot remains in both left and right mainstem bronchus extending into the lobar branches.  RV/LV ratio 1.3, although troponin and BNP are within normal limits.  Complicated by bilateral wedge-shaped consolidations most consistent with infarction.  - Vascular surgery consulted; appreciate their recommendations - Heparin infusion per pharmacy dosing - Prior to discharge, patient requests Xarelto given he is still wary of Eliquis - N.p.o. for now - Norco and Dilaudid for pain control - Repeat troponin pending - Echocardiogram ordered  Hydronephrosis, left CT imaging obtained today demonstrates prominent left-sided hydronephrosis and hydroureter with abrupt change at the left UVJ concerning for stenosis.  Last abdominal imaging was in  2019, at which time there was no evidence of this.  Unclear etiology.  Renal function remains at baseline at this time, but will discuss with urology  - Urology consulted; discussed with urology, per imaging findings hydronephrosis seems chronic.  They will set up outpatient follow-up - Hold off on Foley given patient continues to be able to urinate - Bladder scan  Fall at home, initial encounter Ground-level fall in the setting of dizziness, likely precipitated by PE  - PT/OT  CKD (chronic kidney disease), stage III (HCC) History of CKD stage IIIa with creatinine at approximately 1.  Currently at baseline.  - Continue to monitor given hydronephrosis  CAD (coronary artery disease) Per chart review, patient has a history of CAD with distant MI in 2002 with stenting.  He follows with Dr. Juliann Pares.  Last echocardiogram in 2023 with normal biventricular function and no regional wall motion abnormalities.  Last stress test in 2019 with normal myocardial perfusion.  Patient has been intolerant of statin therapy.  - Continue Aspirin - Hold home Imdur and atenolol due to high risk for hypotension 81 mg daily  COPD (chronic obstructive pulmonary disease) (HCC) No wheezing on examination and  patient declines any shortness of breath  - Continue home bronchodilators  Chronic constipation - Continue home Linzess, however increased to daily dosing given continued constipation reported  Advance Care Planning:   Code Status: DNR/DNI.  Confirmed by patient at bedside with his daughter present.  Consults: Vascular surgery, urology  Family Communication: Patient's daughter updated at bedside  Severity of Illness: The appropriate patient status for this patient is INPATIENT. Inpatient status is judged to be reasonable and necessary in order to provide the required intensity of service to ensure the patient's safety. The patient's presenting symptoms, physical exam findings, and initial radiographic  and laboratory data in the context of their chronic comorbidities is felt to place them at high risk for further clinical deterioration. Furthermore, it is not anticipated that the patient will be medically stable for discharge from the hospital within 2 midnights of admission.   * I certify that at the point of admission it is my clinical judgment that the patient will require inpatient hospital care spanning beyond 2 midnights from the point of admission due to high intensity of service, high risk for further deterioration and high frequency of surveillance required.*  Author: Verdene Lennert, MD 12/07/2022 6:53 PM  For on call review [email or web address]

## 2022-12-07 NOTE — ED Provider Notes (Signed)
[facility] Provider Note    Event Date/Time   First MD Initiated Contact with Patient 12/07/22 1234     (approximate)   History   Fall Formal translating service utilized  HPI  Joseph Osborn [name] is a 89 y.o. male past medical history significant for COPD, prior DVT no longer on anticoagulation, presents to the emergency department following an episode of syncope.  Patient states that from time to time he gets very dizzy with walking and either falls forward or backwards.  Today had an episode of dizziness and felt like the room was spinning and then fell landing on his left side.  Does not feel ongoing dizziness at this time.  Does not not believe he hit his head.  Complaining of pain to his left abdominal wall.  Denies any chest pain or shortness of breath at this time.  No longer on anticoagulation with Eliquis secondary to initially thinking that this was causing his dizziness episode.  Continues to have dizziness episode despite being off of Eliquis.  Denies dysuria, urinary urgency or frequency.  Daughter at bedside is helping to provide history.        Physical Exam   Triage Vital Signs: ED Triage Vitals  Encounter Vitals Group     BP 12/07/22 1156 125/69     Systolic BP Percentile --      Diastolic BP Percentile --      Pulse Rate 12/07/22 1156 84     Resp 12/07/22 1156 18     Temp 12/07/22 1156 99.1 F (37.3 C)     Temp src --      SpO2 12/07/22 1159 92 %     Weight 12/07/22 1157 175 lb (79.4 kg)     Height 12/07/22 1157 5\' 4"  (1.626 m)     Head Circumference --      Peak Flow --      Pain Score 12/07/22 1156 10     Pain Loc --      Pain Education --      Exclude from Growth Chart --     Most recent vital signs: Vitals:   12/07/22 1156 12/07/22 1159  BP: 125/69   Pulse: 84   Resp: 18   Temp: 99.1 F (37.3 C)   SpO2:  92%    Physical Exam Constitutional:      Appearance: He is well-developed.  HENT:     Head:  Atraumatic.  Eyes:     Conjunctiva/sclera: Conjunctivae normal.  Cardiovascular:     Rate and Rhythm: Regular rhythm.  Pulmonary:     Effort: No respiratory distress.  Abdominal:     Tenderness: There is abdominal tenderness (Left lower quadrant and left flank).  Musculoskeletal:        General: Normal range of motion.     Cervical back: Normal range of motion. No tenderness.     Right lower leg: No edema.     Left lower leg: No edema.     Comments: No tenderness to the thoracic, lumbar spine.  Normal sensation to bilateral lower extremities.  No dysmetria.  Skin:    General: Skin is warm.     Capillary Refill: Capillary refill takes less than 2 seconds.  Neurological:     Mental Status: He is alert. Mental status is at baseline.     IMPRESSION / MDM / ASSESSMENT AND PLAN / ED COURSE  I reviewed the triage vital signs and the nursing notes.  Differential diagnosis including anemia, peripheral vertigo, central vertigo, pulmonary embolism, pneumonia, urinary tract infection, kidney stone  EKG  I, Corena Herter, the attending physician, personally viewed and interpreted this ECG.   Rate: Normal  Rhythm: Normal sinus  Axis: Normal  Intervals: Normal  ST&T Change: None No significant change when compared to prior EKG  No tachycardic or bradycardic dysrhythmias while on cardiac telemetry.  RADIOLOGY I independently reviewed imaging, my interpretation of imaging: Chest x-ray with no signs of pneumonia  CTA and CT abdomen and pelvis is pending -discussed results with radiologist.  CTA concerning for bilateral pulmonary embolisms with signs of right heart strain.  Questionable area of either pneumonia versus an infarct.  Also noted left-sided hydronephrosis without an obstructing kidney stone and recommended outpatient urology follow-up.  LABS (all labs ordered are listed, but only abnormal results are displayed) Labs interpreted as -    Labs Reviewed  BASIC METABOLIC PANEL  - Abnormal; Notable for the following components:      Result Value   Sodium 134 (*)    Glucose, Bld 111 (*)    Calcium 8.7 (*)    All other components within normal limits  CBC  BRAIN NATRIURETIC PEPTIDE  URINALYSIS, W/ REFLEX TO CULTURE (INFECTION SUSPECTED)  TROPONIN I (HIGH SENSITIVITY)  TROPONIN I (HIGH SENSITIVITY)     MDM    Given IV fluids, IV fentanyl for pain control   No significant leukocytosis or anemia.  Creatinine is at baseline.  Initial troponin is negative.  Normal BNP.    CTA concerning for pulmonary embolism with right heart strain.  Left-sided hydronephrosis.  Started on heparin bolus and infusion.  Consulted hospitalist for admission.    PROCEDURES:  Critical Care performed: yes  .Critical Care  Performed by: Corena Herter, MD Authorized by: Corena Herter, MD   Critical care provider statement:    Critical care time (minutes):  45   Critical care time was exclusive of:  Separately billable procedures and treating other patients   Critical care was necessary to treat or prevent imminent or life-threatening deterioration of the following conditions:  Cardiac failure   Critical care was time spent personally by me on the following activities:  Development of treatment plan with patient or surrogate, discussions with consultants, evaluation of patient's response to treatment, examination of patient, ordering and review of laboratory studies, ordering and review of radiographic studies, ordering and performing treatments and interventions, pulse oximetry, re-evaluation of patient's condition and review of old charts   Patient's presentation is most consistent with acute presentation with potential threat to life or bodily function.   MEDICATIONS ORDERED IN ED: Medications  sodium chloride 0.9 % bolus 500 mL (0 mLs Intravenous Stopped 12/07/22 1445)  fentaNYL (SUBLIMAZE) injection 50 mcg (50 mcg Intravenous Given 12/07/22 1338)  iohexol (OMNIPAQUE) 350  MG/ML injection 100 mL (100 mLs Intravenous Contrast Given 12/07/22 1418)    FINAL CLINICAL IMPRESSION(S) / ED DIAGNOSES   Final diagnoses:  Fall, initial encounter  Dizzy     Rx / DC Orders   ED Discharge Orders     None        Note:  This document was prepared using Dragon voice recognition software and may include unintentional dictation errors.   Corena Herter, MD 12/07/22 1529

## 2022-12-07 NOTE — Assessment & Plan Note (Addendum)
CT imaging obtained today demonstrates prominent left-sided hydronephrosis and hydroureter with abrupt change at the left UVJ concerning for stenosis.  Last abdominal imaging was in 2019, at which time there was no evidence of this.  Unclear etiology.  Renal function remains at baseline at this time, but will discuss with urology  - Urology consulted; discussed with urology, per imaging findings hydronephrosis seems chronic.  They will set up outpatient follow-up - Hold off on Foley given patient continues to be able to urinate - Bladder scan

## 2022-12-07 NOTE — ED Notes (Signed)
Hospitalist at bedside 

## 2022-12-07 NOTE — Assessment & Plan Note (Signed)
[name] is presenting with sudden onset dizziness today with a ground-level fall, found to have at least submassive PE.  On CTA, clot remains in both left and right mainstem bronchus extending into the lobar branches.  RV/LV ratio 1.3, although troponin and BNP are within normal limits.  Complicated by bilateral wedge-shaped consolidations most consistent with infarction.  - Vascular surgery consulted; appreciate their recommendations - Heparin infusion per pharmacy dosing - Prior to discharge, patient requests Xarelto given he is still wary of Eliquis - N.p.o. for now - Norco and Dilaudid for pain control - Repeat troponin pending - Echocardiogram ordered

## 2022-12-07 NOTE — Assessment & Plan Note (Signed)
History of CKD stage IIIa with creatinine at approximately 1.  Currently at baseline.  - Continue to monitor given hydronephrosis

## 2022-12-08 DIAGNOSIS — I2699 Other pulmonary embolism without acute cor pulmonale: Secondary | ICD-10-CM | POA: Diagnosis not present

## 2022-12-08 LAB — HEPARIN LEVEL (UNFRACTIONATED)
Heparin Unfractionated: 0.78 IU/mL — ABNORMAL HIGH (ref 0.30–0.70)
Heparin Unfractionated: 0.7 IU/mL (ref 0.30–0.70)

## 2022-12-08 MED ORDER — IPRATROPIUM-ALBUTEROL 0.5-2.5 (3) MG/3ML IN SOLN
3.0000 mL | RESPIRATORY_TRACT | Status: DC | PRN
  Administered 2022-12-08: 3 mL via RESPIRATORY_TRACT
  Filled 2022-12-08: qty 3

## 2022-12-08 MED ORDER — DICLOFENAC SODIUM 1 % EX GEL
2.0000 g | Freq: Four times a day (QID) | CUTANEOUS | Status: DC | PRN
  Administered 2022-12-08: 2 g via TOPICAL
  Filled 2022-12-08: qty 100

## 2022-12-08 NOTE — Plan of Care (Signed)
  Problem: Clinical Measurements: Goal: Cardiovascular complication will be avoided Outcome: Progressing   Problem: Activity: Goal: Risk for activity intolerance will decrease Outcome: Progressing   Problem: Nutrition: Goal: Adequate nutrition will be maintained Outcome: Progressing   Problem: Pain Managment: Goal: General experience of comfort will improve Outcome: Progressing   Problem: Safety: Goal: Ability to remain free from injury will improve Outcome: Progressing   

## 2022-12-08 NOTE — Progress Notes (Signed)
OT Cancellation Note  Patient Details Name: Joseph Osborn MRN: [id number] DOB: 1926/04/14   Cancelled Treatment:    Reason Eval/Treat Not Completed: Patient not medically ready. Chart reviewed - pt noted to have PE, heparin initiated 12/07/22 at 1545; per protocol and conversation with MD, plan to hold this date and initiate services next date as pt appropriate.   Kathie Dike, M.S. OTR/L  12/08/22, 12:12 PM  ascom [phone]

## 2022-12-08 NOTE — Consult Note (Signed)
Hospital Consult    Reason for Consult:  Pulmonary Embolism  Requesting Physician:  Dr Imagene Gurney MD MRN #:  [id number]  History of Present Illness: Joseph Osborn is a 89 y.o. 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 Joseph Osborn states he has been experiencing intermittent dizziness for a couple months now that he attribute it to Eliquis. Due to this, he stopped Eliquis approximately 1 month ago. He states that the dizziness both occurs when standing and occasionally when he is already been standing for a while.   He endorses bilateral lower rib pain when taking deep breaths.   Upon exam this afternoon patient is resting comfortably in bed. Currently the patient is requiring any oxygen.  Patient's daughter at the bedside with the mom.  Endorses that the patient's been up walking in the room with assistance.  He stood at the bathroom sink this morning.  Patient denies any dizziness at this time.  Patient remains on heparin infusion.  Patient currently denies any chest pain or blurred vision.  Denies any nausea vomiting or diarrhea.  No complaints overnight.  Vitals are remained stable.  Past Medical History:  Diagnosis Date   Arthritis    Basal cell carcinoma 02/04/2021   L groin, excised 03/23/21   BPH (benign prostatic hyperplasia)    CAD (coronary artery disease)    Chronic kidney disease    Stage III   Community acquired pneumonia 04/21/2021   Post COVID CAP s/p hospitalization   COPD (chronic obstructive pulmonary disease) (HCC)    COVID-19 04/13/2021   DDD (degenerative disc disease), cervical    DVT (deep venous thrombosis) (HCC) 05/30/2022   Left leg   Hypertension    Lipoma 2017   Lower back pain    Myocardial infarction (HCC)    approx 2000   Thyroid disease    Hypothyroidism    Wears dentures    partial upper    Past Surgical History:  Procedure Laterality Date   BLADDER SURGERY  03/2008   bladder polyp removed [name])   CARDIAC CATHETERIZATION  2000   1 stent placed after MI   CATARACT EXTRACTION W/ INTRAOCULAR LENS  IMPLANT, BILATERAL  2014   [facility], Dr. Druscilla Brownie   CATARACT EXTRACTION W/PHACO Left 09/20/2022   Procedure: CATARACT EXTRACTION PHACO AND INTRAOCULAR LENS PLACEMENT (IOC) LEFT  9.43  00:57.3;  Surgeon: Galen Manila, MD;  Location: [facility];  Service: Ophthalmology;  Laterality: Left;   CHOLECYSTECTOMY     In [place]   COLONOSCOPY  03/13/2014   [facility], Dr. Mechele Collin   COLONOSCOPY WITH PROPOFOL N/A 03/03/2016   Procedure: COLONOSCOPY WITH PROPOFOL;  Surgeon: Midge Minium, MD;  Location: [facility];  Service: Endoscopy;  Laterality: N/A;   ESOPHAGOGASTRODUODENOSCOPY (EGD) WITH PROPOFOL N/A 03/03/2016   Procedure: ESOPHAGOGASTRODUODENOSCOPY (EGD) WITH PROPOFOL;  Surgeon: Midge Minium, MD;  Location: [facility];  Service: Endoscopy;  Laterality: N/A;  Interpreter needed   HEMORRHOID SURGERY     [place], [place]   TRANSURETHRAL RESECTION OF PROSTATE  03/24/2008   Dr Sheppard Penton, [facility]    Allergies  Allergen Reactions   Shellfish Allergy Anaphylaxis   Oxybutynin Other (See Comments)    Doesn't remember reaction    Prior to Admission medications   Medication Sig Start Date End Date Taking? Authorizing Provider  acetaminophen (  TYLENOL) 500 MG tablet Take 2 tablets (1,000 mg total) by mouth in the morning, at noon, and at bedtime. 02/14/22   Eustaquio Boyden, MD  atenolol (TENORMIN) 50 MG tablet Take 1 tablet (50 mg total) by mouth daily. 08/10/22   Eustaquio Boyden, MD  azelastine (ASTELIN) 0.1 % nasal spray Place 1-2 sprays into both nostrils 2 (two) times daily. Use in each nostril as directed 11/07/22   Eustaquio Boyden, MD  Cholecalciferol (VITAMIN D3) 25 MCG (1000 UT) capsule Take 1 capsule (1,000  Units total) by mouth daily. 08/15/22   Eustaquio Boyden, MD  fluticasone furoate-vilanterol (BREO ELLIPTA) 200-25 MCG/ACT AEPB Inhale 1 puff into the lungs daily. 10/12/22   Eustaquio Boyden, MD  isosorbide mononitrate (IMDUR) 30 MG 24 hr tablet Take 30 mg by mouth daily.    [provider]  linaclotide (LINZESS) 72 MCG capsule Take 1 capsule (72 mcg total) by mouth every other day. 06/10/22   Eustaquio Boyden, MD    Social History   Socioeconomic History   Marital status: Widowed    Spouse name: Not on file   Number of children: Not on file   Years of education: Not on file   Highest education level: Not on file  Occupational History   Not on file  Tobacco Use   Smoking status: Never   Smokeless tobacco: Never  Vaping Use   Vaping status: Never Used  Substance and Sexual Activity   Alcohol use: Not Currently    Comment: very rare(Holidays)   Drug use: No   Sexual activity: Not on file  Other Topics Concern   Not on file  Social History Narrative   Lives with wife, daughter [name]) and her husband and son   From [place], [place]   Occ: retired, previously worked Visual merchandiser    Social Determinants of Corporate investment banker Strain: Low Risk  (07/15/2021)   Overall Financial Resource Strain (CARDIA)    Difficulty of Paying Living Expenses: Not hard at all  Food Insecurity: No Food Insecurity (12/07/2022)   Hunger Vital Sign    Worried About Running Out of Food in the Last Year: Never true    Ran Out of Food in the Last Year: Never true  Transportation Needs: No Transportation Needs (12/07/2022)   PRAPARE - Administrator, Civil Service (Medical): No    Lack of Transportation (Non-Medical): No  Physical Activity: Insufficiently Active (07/15/2021)   Exercise Vital Sign    Days of Exercise per Week: 4 days    Minutes of Exercise per Session: 30 min  Stress: No Stress Concern Present (07/15/2021)   Joseph Osborn of Occupational Health - Occupational  Stress Questionnaire    Feeling of Stress : Only a little  Social Connections: Moderately Isolated (07/15/2021)   Social Connection and Isolation Panel [NHANES]    Frequency of Communication with Friends and Family: More than three times a week    Frequency of Social Gatherings with Friends and Family: More than three times a week    Attends Religious Services: Never    Database administrator or Organizations: No    Attends Banker Meetings: Never    Marital Status: Married  Catering manager Violence: Not At Risk (12/07/2022)   Humiliation, Afraid, Rape, and Kick questionnaire    Fear of Current or Ex-Partner: No    Emotionally Abused: No    Physically Abused: No    Sexually Abused: No     Family History  Problem Relation Age of Onset   Heart disease Mother    Heart attack Mother    Liver cancer Cousin    Diabetes Neg Hx     ROS: Otherwise negative unless mentioned in HPI  Physical Examination  Vitals:   12/08/22 0842 12/08/22 1151  BP: (!) 121/56 125/66  Pulse: 88 88  Resp: 16 16  Temp: 97.9 F (36.6 C) 98.3 F (36.8 C)  SpO2: 92% 90%   Body mass index is 30.04 kg/m.  General:  WDWN in NAD Gait: Not observed HENT: WNL, normocephalic Pulmonary: normal non-labored breathing, without Rales, rhonchi,  wheezing Cardiac: regular, without  Murmurs, rubs or gallops; without carotid bruits Abdomen: Positive bowel sounds, soft, NT/ND, no masses Skin: without rashes Vascular Exam/Pulses: Palpable pulses throughout. Extremities: without ischemic changes, without Gangrene , without cellulitis; without open wounds;  Musculoskeletal: no muscle wasting or atrophy  Neurologic: A&O X 3;  No focal weakness or paresthesias are detected; speech is fluent/normal Psychiatric:  The pt has Normal affect. Lymph:  Unremarkable  CBC    Component Value Date/Time   WBC 5.3 12/08/2022 0444   RBC 4.55 12/08/2022 0444   HGB 14.3 12/08/2022 0444   HCT 41.5 12/08/2022 0444    PLT 139 (L) 12/08/2022 0444   MCV 91.2 12/08/2022 0444   MCH 31.4 12/08/2022 0444   MCHC 34.5 12/08/2022 0444   RDW 12.7 12/08/2022 0444   LYMPHSABS 1.5 10/06/2022 1135   MONOABS 0.3 10/06/2022 1135   EOSABS 0.2 10/06/2022 1135   BASOSABS 0.1 10/06/2022 1135    BMET    Component Value Date/Time   NA 139 12/08/2022 0444   K 4.7 12/08/2022 0444   CL 104 12/08/2022 0444   CO2 26 12/08/2022 0444   GLUCOSE 98 12/08/2022 0444   BUN 15 12/08/2022 0444   CREATININE 0.87 12/08/2022 0444   CALCIUM 9.0 12/08/2022 0444   GFRNONAA >60 12/08/2022 0444   GFRAA >60 09/08/2017 2300    COAGS: Lab Results  Component Value Date   INR 1.1 12/07/2022   INR 1.3 (H) 10/06/2022     Non-Invasive Vascular Imaging:    EXAM: CT ANGIOGRAPHY CHEST  FINDINGS: CTA CHEST FINDINGS   Cardiovascular: This is a technically adequate evaluation of the pulmonary vasculature. On the left, there are central and segmental pulmonary emboli extending from the left main pulmonary artery into the upper and lower lobe branches. On the right, there are central and segmental right middle and right lower lobe pulmonary emboli. Moderate clot burden, with RV/LV ratio measuring 1.3. Findings are consistent with right heart strain.   There is prominent right atrial dilatation as well. No pericardial effusion.   No evidence of thoracic aortic aneurysm or dissection. Soft atheromatous plaque is seen within the distal aortic arch and proximal descending thoracic aorta, slightly more pronounced since prior study. There is no flow limiting stenosis within the aorta however.   Mediastinum/Nodes: No enlarged mediastinal, hilar, or axillary lymph nodes. Thyroid gland, trachea, and esophagus demonstrate no significant findings.   Lungs/Pleura: Peripheral wedge-shaped areas of consolidation are seen within the bilateral lower lobes, which may be hypoventilatory or related to developing pulmonary infarcts given the  associated pulmonary emboli. Continued follow-up is recommended to document resolution. No acute airspace disease, effusion, or pneumothorax. Left lower lobe bronchial wall thickening. Central airways are patent.   Musculoskeletal: There are no acute or destructive bony abnormalities. Reconstructed images demonstrate no additional findings.   Review of the MIP images confirms the above  findings.  Statin:  No. Beta Blocker:  Yes.   Aspirin:  No. ACEI:  No. ARB:  No. CCB use:  No Other antiplatelets/anticoagulants:  Yes.   Eliquis but stopped it a month ago.    ASSESSMENT/PLAN: This is a 89 y.o. male who presents to [facility] emergency department with intermittent dizziness for couple of months.  He was currently on Eliquis and stopped it due to the dizziness.  Patient's dizziness has a room spinning while he was in the bathroom washing his hands.  He then fell onto the toilet hitting the toilet with his left chest and flank area.  Upon workup patient was noted to have on CTA of the chest right central and segmental right middle and right lower lobe pulmonary emboli.  Patient had an echocardiogram done on 12/07/2022 and that was normal.  PLAN: I had a long discussion with the patient and his wife and his daughter at the bedside this afternoon.  We discussed pulmonary thrombectomy as a treatment option for his pulmonary embolisms.  We discussed returning to anticoagulation therapy only as a treatment modality.  They were informed that at the minimum patient would be required to return to anticoagulation therapy most likely for the rest of his life.  Patient's daughter wishes to think about a treatment option overnight and discuss it with the rest of the family prior to proceeding.  I will follow-up with the patient and his daughter tomorrow to determine future plans.   -I discussed in detail the plan with Dr. Vilinda Flake MD and he agrees with the plan.   Marcie Bal [facility] 12/08/2022 1:25 PM

## 2022-12-08 NOTE — Consult Note (Signed)
ANTICOAGULATION CONSULT NOTE  Pharmacy Consult for heparin infusion Indication: pulmonary embolus  Allergies  Allergen Reactions   Shellfish Allergy Anaphylaxis   Oxybutynin Other (See Comments)    Doesn't remember reaction    Patient Measurements: Height: 5\' 4"  (162.6 cm) Weight: 79.4 kg (175 lb) IBW/kg (Calculated) : 59.2 Heparin Dosing Weight: 75.6 kg  Vital Signs: Temp: 99.2 F (37.3 C) (08/01 1930) Temp Source: Oral (08/01 1930) BP: 153/83 (08/01 1930) Pulse Rate: 105 (08/01 1930)  Labs: Recent Labs    12/07/22 1159 12/07/22 1456 12/07/22 1619 12/08/22 0019 12/08/22 0444 12/08/22 0836 12/08/22 1903  HGB 14.8  --   --   --  14.3  --   --   HCT 45.1  --   --   --  41.5  --   --   PLT 154  --   --   --  139*  --   --   APTT  --   --  27  --   --   --   --   LABPROT  --   --  14.6  --   --   --   --   INR  --   --  1.1  --   --   --   --   HEPARINUNFRC  --   --   --  0.62  --  0.78* 0.70  CREATININE 1.03  --   --   --  0.87  --   --   TROPONINIHS 5 6  --   --   --   --   --     Estimated Creatinine Clearance: 48.3 mL/min (by C-G formula based on SCr of 0.87 mg/dL).   Medical History: Past Medical History:  Diagnosis Date   Arthritis    Basal cell carcinoma 02/04/2021   L groin, excised 03/23/21   BPH (benign prostatic hyperplasia)    CAD (coronary artery disease)    Chronic kidney disease    Stage III   Community acquired pneumonia 04/21/2021   Post COVID CAP s/p hospitalization   COPD (chronic obstructive pulmonary disease) (HCC)    COVID-19 04/13/2021   DDD (degenerative disc disease), cervical    DVT (deep venous thrombosis) (HCC) 05/30/2022   Left leg   Hypertension    Lipoma 2017   Lower back pain    Myocardial infarction (HCC)    approx 2000   Thyroid disease    Hypothyroidism   Wears dentures    partial upper    Medications:  Patient was on Eliquis for DVT but this was stopped in June.  Assessment: 89 yo male presented to the ED  after syncopal event. Patient has history of DVT that was treatment with Eliquis which was stopped in June. CT positive for bilateral pulmonary embolus.  Pharmacy consulted to start heparin infusion.    Goal of Therapy:  Heparin level 0.3-0.7 units/ml Monitor platelets by anticoagulation protocol: Yes    8/1@0019 , HL=0.62, therapeutic X 1 8/01 0836 HL 0.78 8/01 1903 HL 0.70, therapeutic x 1  Plan:  - Continue heparin infusion at 1100 units/hr - Recheck HL in w/ AM lab to confirm - CBC/H&H daily  Otelia Sergeant, PharmD, [facility] 12/08/2022 8:01 PM

## 2022-12-08 NOTE — Consult Note (Signed)
ANTICOAGULATION CONSULT NOTE  Pharmacy Consult for heparin infusion Indication: pulmonary embolus  Allergies  Allergen Reactions   Shellfish Allergy Anaphylaxis   Oxybutynin Other (See Comments)    Doesn't remember reaction    Patient Measurements: Height: 5\' 4"  (162.6 cm) Weight: 79.4 kg (175 lb) IBW/kg (Calculated) : 59.2 Heparin Dosing Weight: 75.6 kg  Vital Signs: Temp: 98 F (36.7 C) (07/31 2309) Temp Source: Oral (07/31 2309) BP: 125/73 (07/31 2309) Pulse Rate: 71 (07/31 2309)  Labs: Recent Labs    12/07/22 1159 12/07/22 1456 12/07/22 1619 12/08/22 0019  HGB 14.8  --   --   --   HCT 45.1  --   --   --   PLT 154  --   --   --   APTT  --   --  27  --   LABPROT  --   --  14.6  --   INR  --   --  1.1  --   HEPARINUNFRC  --   --   --  0.62  CREATININE 1.03  --   --   --   TROPONINIHS 5 6  --   --     Estimated Creatinine Clearance: 40.8 mL/min (by C-G formula based on SCr of 1.03 mg/dL).   Medical History: Past Medical History:  Diagnosis Date   Arthritis    Basal cell carcinoma 02/04/2021   L groin, excised 03/23/21   BPH (benign prostatic hyperplasia)    CAD (coronary artery disease)    Chronic kidney disease    Stage III   Community acquired pneumonia 04/21/2021   Post COVID CAP s/p hospitalization   COPD (chronic obstructive pulmonary disease) (HCC)    COVID-19 04/13/2021   DDD (degenerative disc disease), cervical    DVT (deep venous thrombosis) (HCC) 05/30/2022   Left leg   Hypertension    Lipoma 2017   Lower back pain    Myocardial infarction (HCC)    approx 2000   Thyroid disease    Hypothyroidism   Wears dentures    partial upper    Medications:  Patient was on Eliquis for DVT but this was stopped in June.  Assessment: 89 yo male presented to the ED after syncopal event. Patient has history of DVT that was treatment with Eliquis which was stopped in June. CT positive for bilateral pulmonary embolus.  Pharmacy consulted to start  heparin infusion.    Goal of Therapy:  Heparin level 0.3-0.7 units/ml Monitor platelets by anticoagulation protocol: Yes   Plan:  8/1:  HL @ 0019 = 0.62, therapeutic X 1 - Will continue pt on current rate and draw confirmation HL in  8 hrs on 8/1 @ 0800   Genetta Fiero D, PharmD 12/08/2022,12:57 AM

## 2022-12-08 NOTE — Progress Notes (Addendum)
Transition of Care [facility]) - Inpatient Brief Assessment   Patient Details  Name: Joseph Osborn MRN: [id number] Date of Birth: [birth year]  [facility] CM/SW Contact:    Darolyn Rua, LCSW Phone Number: 12/08/2022, 10:27 AM   Clinical Narrative:  CSW notes patient coming from home with daughter, presented to ED with dizziness contributing to recent fall.   PCP: Dr. Sharen Hones Insurance: Medicare A & B  Please consult toc should discharge planning needs arise.   Transition of Care Asessment: Insurance and Status: Insurance coverage has been reviewed Patient has primary care physician: Yes Home environment has been reviewed: from home Prior level of function:: independent Prior/Current Home Services: No current home services Social Determinants of Health Reivew: SDOH reviewed no interventions necessary Readmission risk has been reviewed: Yes Transition of care needs: no transition of care needs at this time

## 2022-12-08 NOTE — Plan of Care (Signed)

## 2022-12-08 NOTE — Consult Note (Signed)
ANTICOAGULATION CONSULT NOTE  Pharmacy Consult for heparin infusion Indication: pulmonary embolus  Allergies  Allergen Reactions   Shellfish Allergy Anaphylaxis   Oxybutynin Other (See Comments)    Doesn't remember reaction    Patient Measurements: Height: 5\' 4"  (162.6 cm) Weight: 79.4 kg (175 lb) IBW/kg (Calculated) : 59.2 Heparin Dosing Weight: 75.6 kg  Vital Signs: Temp: 97.9 F (36.6 C) (08/01 0842) Temp Source: Oral (08/01 0357) BP: 121/56 (08/01 0842) Pulse Rate: 88 (08/01 0842)  Labs: Recent Labs    12/07/22 1159 12/07/22 1456 12/07/22 1619 12/08/22 0019 12/08/22 0444 12/08/22 0836  HGB 14.8  --   --   --  14.3  --   HCT 45.1  --   --   --  41.5  --   PLT 154  --   --   --  139*  --   APTT  --   --  27  --   --   --   LABPROT  --   --  14.6  --   --   --   INR  --   --  1.1  --   --   --   HEPARINUNFRC  --   --   --  0.62  --  0.78*  CREATININE 1.03  --   --   --  0.87  --   TROPONINIHS 5 6  --   --   --   --     Estimated Creatinine Clearance: 48.3 mL/min (by C-G formula based on SCr of 0.87 mg/dL).   Medical History: Past Medical History:  Diagnosis Date   Arthritis    Basal cell carcinoma 02/04/2021   L groin, excised 03/23/21   BPH (benign prostatic hyperplasia)    CAD (coronary artery disease)    Chronic kidney disease    Stage III   Community acquired pneumonia 04/21/2021   Post COVID CAP s/p hospitalization   COPD (chronic obstructive pulmonary disease) (HCC)    COVID-19 04/13/2021   DDD (degenerative disc disease), cervical    DVT (deep venous thrombosis) (HCC) 05/30/2022   Left leg   Hypertension    Lipoma 2017   Lower back pain    Myocardial infarction (HCC)    approx 2000   Thyroid disease    Hypothyroidism   Wears dentures    partial upper    Medications:  Patient was on Eliquis for DVT but this was stopped in June.  Assessment: 89 yo male presented to the ED after syncopal event. Patient has history of DVT that was  treatment with Eliquis which was stopped in June. CT positive for bilateral pulmonary embolus.  Pharmacy consulted to start heparin infusion.    Goal of Therapy:  Heparin level 0.3-0.7 units/ml Monitor platelets by anticoagulation protocol: Yes    8/1@0019 , HL=0.62, therapeutic X 1  Plan:  8/1@0836 , HL=0.78, SUPRAtherapeutic - Will decrease heparin infusion to 1100 units/hr - Recheck HL in 8 hours after rate change - CBC/H&H daily  Bettey Costa, PharmD 12/08/2022,9:58 AM

## 2022-12-08 NOTE — Progress Notes (Signed)
PT Cancellation Note  Patient Details Name: Joseph Osborn MRN: [id number] DOB: [birth year]   Cancelled Treatment:    Reason Eval/Treat Not Completed: Patient not medically ready. Patient on heparin for 24 hours. MD asked to hold until tomorrow.     Wynona Duhamel 12/08/2022, 12:37 PM

## 2022-12-08 NOTE — Progress Notes (Addendum)
Progress Note    Trampas Ravens  ZOX:[id number] DOB: 08-Oct-1925  DOA: 12/07/2022 PCP: Eustaquio Boyden, MD      Brief Narrative:    Medical records reviewed and are as summarized below:  Joseph Osborn is a 89 y.o. male with medical history significant for recent DVT (took Eliquis from January 2024 to June 2024), COPD, hypertension, CAD (coronary stent in 2000), CKD stage IIIa, hypothyroidism, chronic venous insufficiency, chronic intermittent dizziness (daughter said he stopped Eliquis because he attributed dizziness to Eliquis), recurrent falls at home.  He presented to the hospital after a fall at home.  He fell onto the toilet and hit the toilet with his left chest and flank area.   Workup in the hospital revealed acute bilateral pulmonary embolism. He was treated with IV heparin drip.      Assessment/Plan:   Principal Problem:   Pulmonary embolism and infarction [facility]) Active Problems:   Hydronephrosis, left   Fall at home, initial encounter   CKD (chronic kidney disease), stage III (HCC)   CAD (coronary artery disease)   COPD (chronic obstructive pulmonary disease) (HCC)   Chronic constipation    Body mass index is 30.04 kg/m.  (Obesity)   Acute bilateral pulmonary embolism, pulmonary infarct: He is tolerating room air.  Continue IV heparin drip and heparin level per protocol.  He does not want to use Eliquis again because he thinks Eliquis was making him dizzy.  He is agreeable to use Xarelto for long-term anticoagulation. 2D echo showed normal EF, normal RV size and function, grade 1 diastolic dysfunction.  Follow-up with vascular surgeon.   Chronic left hydronephrosis, enlarged prostate: Urologist recommended outpatient follow-up.   CAD with coronary stent in 2000: Continue aspirin.  Myocardial perfusion stress test in 2019 was normal.  Patient is intolerant of statins. Atenolol (provide mononitrate on hold.   S/p falls at home, left  flank pain from fall: Analgesics as needed for pain.  Consult PT and OT.   Chronic intermittent dizziness: Chart review showed that he was seen on 06/04/2020 for dizziness and differential diagnoses at that time included postural changes versus effect of tamsulosin. Check orthostatic vital signs.   Diet Order             Diet NPO time specified  Diet effective midnight                            Consultants: Vascular surgeon  Procedures: None    Medications:    fluticasone furoate-vilanterol  1 puff Inhalation Daily   lidocaine  1 patch Transdermal Q24H   linaclotide  72 mcg Oral QAC breakfast   multivitamin with minerals  1 tablet Oral Daily   sodium chloride flush  3 mL Intravenous Q12H   Continuous Infusions:  heparin 1,200 Units/hr (12/07/22 1627)     Anti-infectives (From admission, onward)    None              Family Communication/Anticipated D/C date and plan/Code Status   DVT prophylaxis:      Code Status: DNR  Family Communication: Plan of care was discussed with his wife and daughter at the bedside Disposition Plan: Plan to discharge to SNF   Status is: Inpatient Remains inpatient appropriate because: Acute pulmonary embolism       Subjective:   Interval events noted. No chest pain or shortness of breath.  Wife and [name], daughter, at the  bedside  Objective:    Vitals:   12/07/22 2016 12/07/22 2309 12/08/22 0357 12/08/22 0842  BP: 134/66 125/73 128/68 (!) 121/56  Pulse: 83 71 69 88  Resp: 16 18 18 16   Temp: 98.7 F (37.1 C) 98 F (36.7 C) 97.6 F (36.4 C) 97.9 F (36.6 C)  TempSrc:  Oral Oral   SpO2: 92% 94% 94% 92%  Weight:      Height:       Orthostatic VS for the past 24 hrs:  BP- Lying Pulse- Lying BP- Sitting Pulse- Sitting BP- Standing at 0 minutes Pulse- Standing at 0 minutes  12/07/22 1445 147/75 77 153/81 76 141/82 78     Intake/Output Summary (Last 24 hours) at 12/08/2022 1610 Last data  filed at 12/07/2022 2200 Gross per 24 hour  Intake 820 ml  Output 500 ml  Net 320 ml   Filed Weights   12/07/22 1157  Weight: 79.4 kg    Exam:  GEN: NAD SKIN: Warm and dry EYES: No pallor or icterus ENT: MMM CV: RRR PULM: CTA B ABD: soft, ND, +BS, mild left flank tenderness which he attributes to recent fall CNS: AAO x 3, non focal EXT: No edema or tenderness       Data Reviewed:   I have personally reviewed following labs and imaging studies:  Labs: Labs show the following:   Basic Metabolic Panel: Recent Labs  Lab 12/07/22 1159 12/08/22 0444  NA 134* 139  K 4.7 4.7  CL 102 104  CO2 25 26  GLUCOSE 111* 98  BUN 17 15  CREATININE 1.03 0.87  CALCIUM 8.7* 9.0   GFR Estimated Creatinine Clearance: 48.3 mL/min (by C-G formula based on SCr of 0.87 mg/dL). Liver Function Tests: No results for input(s): "AST", "ALT", "ALKPHOS", "BILITOT", "PROT", "ALBUMIN" in the last 168 hours. No results for input(s): "LIPASE", "AMYLASE" in the last 168 hours. No results for input(s): "AMMONIA" in the last 168 hours. Coagulation profile Recent Labs  Lab 12/07/22 1619  INR 1.1    CBC: Recent Labs  Lab 12/07/22 1159 12/08/22 0444  WBC 5.9 5.3  HGB 14.8 14.3  HCT 45.1 41.5  MCV 93.8 91.2  PLT 154 139*   Cardiac Enzymes: No results for input(s): "CKTOTAL", "CKMB", "CKMBINDEX", "TROPONINI" in the last 168 hours. BNP (last 3 results) Recent Labs    02/21/22 1226  PROBNP 98.0   CBG: No results for input(s): "GLUCAP" in the last 168 hours. D-Dimer: No results for input(s): "DDIMER" in the last 72 hours. Hgb A1c: No results for input(s): "HGBA1C" in the last 72 hours. Lipid Profile: No results for input(s): "CHOL", "HDL", "LDLCALC", "TRIG", "CHOLHDL", "LDLDIRECT" in the last 72 hours. Thyroid function studies: No results for input(s): "TSH", "T4TOTAL", "T3FREE", "THYROIDAB" in the last 72 hours.  Invalid input(s): "FREET3" Anemia work up: No results for  input(s): "VITAMINB12", "FOLATE", "FERRITIN", "TIBC", "IRON", "RETICCTPCT" in the last 72 hours. Sepsis Labs: Recent Labs  Lab 12/07/22 1159 12/08/22 0444  WBC 5.9 5.3    Microbiology No results found for this or any previous visit (from the past 240 hour(s)).  Procedures and diagnostic studies:  CT Angio Chest PE W/Cm &/Or Wo Cm  Result Date: 12/07/2022 CLINICAL DATA:  Multiple falls within the last week, dizziness, block left-sided abdominal trauma EXAM: CT ANGIOGRAPHY CHEST CT ABDOMEN AND PELVIS WITH CONTRAST TECHNIQUE: Multidetector CT imaging of the chest was performed using the standard protocol during bolus administration of intravenous contrast. Multiplanar CT image reconstructions and  MIPs were obtained to evaluate the vascular anatomy. Multidetector CT imaging of the abdomen and pelvis was performed using the standard protocol during bolus administration of intravenous contrast. RADIATION DOSE REDUCTION: This exam was performed according to the departmental dose-optimization program which includes automated exposure control, adjustment of the mA and/or kV according to patient size and/or use of iterative reconstruction technique. CONTRAST:  OMNIPAQUE IOHEXOL 350 MG/ML SOLN COMPARISON:  09/09/2017, 02/02/2016 FINDINGS: CTA CHEST FINDINGS Cardiovascular: This is a technically adequate evaluation of the pulmonary vasculature. On the left, there are central and segmental pulmonary emboli extending from the left main pulmonary artery into the upper and lower lobe branches. On the right, there are central and segmental right middle and right lower lobe pulmonary emboli. Moderate clot burden, with RV/LV ratio measuring 1.3. Findings are consistent with right heart strain. There is prominent right atrial dilatation as well. No pericardial effusion. No evidence of thoracic aortic aneurysm or dissection. Soft atheromatous plaque is seen within the distal aortic arch and proximal descending  thoracic aorta, slightly more pronounced since prior study. There is no flow limiting stenosis within the aorta however. Mediastinum/Nodes: No enlarged mediastinal, hilar, or axillary lymph nodes. Thyroid gland, trachea, and esophagus demonstrate no significant findings. Lungs/Pleura: Peripheral wedge-shaped areas of consolidation are seen within the bilateral lower lobes, which may be hypoventilatory or related to developing pulmonary infarcts given the associated pulmonary emboli. Continued follow-up is recommended to document resolution. No acute airspace disease, effusion, or pneumothorax. Left lower lobe bronchial wall thickening. Central airways are patent. Musculoskeletal: There are no acute or destructive bony abnormalities. Reconstructed images demonstrate no additional findings. Review of the MIP images confirms the above findings. CT ABDOMEN and PELVIS FINDINGS Hepatobiliary: No focal liver abnormality is seen. Status post cholecystectomy. No biliary dilatation. Pancreas: Unremarkable. No pancreatic ductal dilatation or surrounding inflammatory changes. Spleen: No splenic injury or perisplenic hematoma. Adrenals/Urinary Tract: There is moderate to severe left hydronephrosis and hydroureter, with abrupt change in caliber involving the distal left ureter just proximal to the UVJ, reference image 70/11. There is no discrete mass or obstructing calculus in this region. Ureteroscopy may be useful for further evaluation. There is normal excretion of contrast identified in the left kidney on delayed imaging. The right kidney is unremarkable. The adrenals and bladder are unremarkable. Stomach/Bowel: No bowel obstruction or ileus. Normal appendix right lower quadrant. No bowel wall thickening or inflammatory change. Vascular/Lymphatic: Aortic atherosclerosis. No enlarged abdominal or pelvic lymph nodes. Reproductive: Stable marked enlargement of the prostate, which measures 5.3 x 6.4 cm. Other: No free fluid or  free intraperitoneal gas. There are multiple fat containing midline supraumbilical ventral hernias which are stable since prior study. Stable fat containing inguinal hernias. No bowel herniation. Musculoskeletal: No acute or destructive bony abnormalities. Reconstructed images demonstrate no additional findings. Review of the MIP images confirms the above findings. IMPRESSION: Chest: 1. Bilateral central and segmental pulmonary emboli, with CT evidence of right heart strain (RV/LV Ratio = 1.3) consistent with at least submassive (intermediate risk) PE. The presence of right heart strain has been associated with an increased risk of morbidity and mortality. Please refer to the "Code PE Focused" order set in EPIC. 2. Stable soft mural thrombus within the distal aortic arch and proximal descending thoracic aorta, without flow limiting stenosis. No aneurysm or dissection. 3. Bibasilar peripheral wedge-shaped consolidation. Differential diagnosis would include developing infarct versus pneumonia. Follow-up is recommended to ensure resolution and exclude underlying neoplasm. Abdomen/pelvis: 1. Prominent left-sided hydronephrosis and hydroureter,  with abrupt change in caliber of the distal left ureter just proximal to the left UVJ. I do not see any obstructing mass or calculus, and stenosis at the left UV a could be considered. Ureteroscopy may be useful for further evaluation. 2. Marked enlargement the prostate. 3. Fat containing midline ventral and bilateral inguinal hernias. No bowel herniation. Critical Value/emergent results were called by telephone at the time of interpretation on 12/07/2022 at 3:27 pm to provider [facility] , who verbally acknowledged these results. Electronically Signed   By: Sharlet Salina M.D.   On: 12/07/2022 15:27   CT ABDOMEN PELVIS W CONTRAST  Result Date: 12/07/2022 CLINICAL DATA:  Multiple falls within the last week, dizziness, block left-sided abdominal trauma EXAM: CT ANGIOGRAPHY  CHEST CT ABDOMEN AND PELVIS WITH CONTRAST TECHNIQUE: Multidetector CT imaging of the chest was performed using the standard protocol during bolus administration of intravenous contrast. Multiplanar CT image reconstructions and MIPs were obtained to evaluate the vascular anatomy. Multidetector CT imaging of the abdomen and pelvis was performed using the standard protocol during bolus administration of intravenous contrast. RADIATION DOSE REDUCTION: This exam was performed according to the departmental dose-optimization program which includes automated exposure control, adjustment of the mA and/or kV according to patient size and/or use of iterative reconstruction technique. CONTRAST:  OMNIPAQUE IOHEXOL 350 MG/ML SOLN COMPARISON:  09/09/2017, 02/02/2016 FINDINGS: CTA CHEST FINDINGS Cardiovascular: This is a technically adequate evaluation of the pulmonary vasculature. On the left, there are central and segmental pulmonary emboli extending from the left main pulmonary artery into the upper and lower lobe branches. On the right, there are central and segmental right middle and right lower lobe pulmonary emboli. Moderate clot burden, with RV/LV ratio measuring 1.3. Findings are consistent with right heart strain. There is prominent right atrial dilatation as well. No pericardial effusion. No evidence of thoracic aortic aneurysm or dissection. Soft atheromatous plaque is seen within the distal aortic arch and proximal descending thoracic aorta, slightly more pronounced since prior study. There is no flow limiting stenosis within the aorta however. Mediastinum/Nodes: No enlarged mediastinal, hilar, or axillary lymph nodes. Thyroid gland, trachea, and esophagus demonstrate no significant findings. Lungs/Pleura: Peripheral wedge-shaped areas of consolidation are seen within the bilateral lower lobes, which may be hypoventilatory or related to developing pulmonary infarcts given the associated pulmonary emboli.  Continued follow-up is recommended to document resolution. No acute airspace disease, effusion, or pneumothorax. Left lower lobe bronchial wall thickening. Central airways are patent. Musculoskeletal: There are no acute or destructive bony abnormalities. Reconstructed images demonstrate no additional findings. Review of the MIP images confirms the above findings. CT ABDOMEN and PELVIS FINDINGS Hepatobiliary: No focal liver abnormality is seen. Status post cholecystectomy. No biliary dilatation. Pancreas: Unremarkable. No pancreatic ductal dilatation or surrounding inflammatory changes. Spleen: No splenic injury or perisplenic hematoma. Adrenals/Urinary Tract: There is moderate to severe left hydronephrosis and hydroureter, with abrupt change in caliber involving the distal left ureter just proximal to the UVJ, reference image 70/11. There is no discrete mass or obstructing calculus in this region. Ureteroscopy may be useful for further evaluation. There is normal excretion of contrast identified in the left kidney on delayed imaging. The right kidney is unremarkable. The adrenals and bladder are unremarkable. Stomach/Bowel: No bowel obstruction or ileus. Normal appendix right lower quadrant. No bowel wall thickening or inflammatory change. Vascular/Lymphatic: Aortic atherosclerosis. No enlarged abdominal or pelvic lymph nodes. Reproductive: Stable marked enlargement of the prostate, which measures 5.3 x 6.4 cm. Other: No free  fluid or free intraperitoneal gas. There are multiple fat containing midline supraumbilical ventral hernias which are stable since prior study. Stable fat containing inguinal hernias. No bowel herniation. Musculoskeletal: No acute or destructive bony abnormalities. Reconstructed images demonstrate no additional findings. Review of the MIP images confirms the above findings. IMPRESSION: Chest: 1. Bilateral central and segmental pulmonary emboli, with CT evidence of right heart strain (RV/LV  Ratio = 1.3) consistent with at least submassive (intermediate risk) PE. The presence of right heart strain has been associated with an increased risk of morbidity and mortality. Please refer to the "Code PE Focused" order set in EPIC. 2. Stable soft mural thrombus within the distal aortic arch and proximal descending thoracic aorta, without flow limiting stenosis. No aneurysm or dissection. 3. Bibasilar peripheral wedge-shaped consolidation. Differential diagnosis would include developing infarct versus pneumonia. Follow-up is recommended to ensure resolution and exclude underlying neoplasm. Abdomen/pelvis: 1. Prominent left-sided hydronephrosis and hydroureter, with abrupt change in caliber of the distal left ureter just proximal to the left UVJ. I do not see any obstructing mass or calculus, and stenosis at the left UV a could be considered. Ureteroscopy may be useful for further evaluation. 2. Marked enlargement the prostate. 3. Fat containing midline ventral and bilateral inguinal hernias. No bowel herniation. Critical Value/emergent results were called by telephone at the time of interpretation on 12/07/2022 at 3:27 pm to provider [facility] , who verbally acknowledged these results. Electronically Signed   By: Sharlet Salina M.D.   On: 12/07/2022 15:27   DG Chest 2 View  Result Date: 12/07/2022 CLINICAL DATA:  Multiple falls. EXAM: CHEST - 2 VIEW COMPARISON:  02/21/2022 FINDINGS: The cardio pericardial silhouette is enlarged. Interstitial markings are diffusely coarsened with chronic features. Basilar atelectasis or scarring with small bilateral pleural effusions. Bones are diffusely demineralized. IMPRESSION: Chronic interstitial coarsening with small bilateral pleural effusions and basilar atelectasis or scarring. Electronically Signed   By: Kennith Center M.D.   On: 12/07/2022 13:01   CT HEAD WO CONTRAST ( )  Result Date: 12/07/2022 CLINICAL DATA:  Multiple falls. EXAM: CT HEAD WITHOUT CONTRAST  TECHNIQUE: Contiguous axial images were obtained from the base of the skull through the vertex without intravenous contrast. RADIATION DOSE REDUCTION: This exam was performed according to the departmental dose-optimization program which includes automated exposure control, adjustment of the mA and/or kV according to patient size and/or use of iterative reconstruction technique. COMPARISON:  10/06/2022 FINDINGS: Brain: There is no evidence for acute hemorrhage, hydrocephalus, mass lesion, or abnormal extra-axial fluid collection. No definite CT evidence for acute infarction. Diffuse loss of parenchymal volume is consistent with atrophy. Patchy low attenuation in the deep hemispheric and periventricular white matter is nonspecific, but likely reflects chronic microvascular ischemic demyelination. Vascular: No hyperdense vessel or unexpected calcification. Skull: No evidence for fracture. No worrisome lytic or sclerotic lesion. Sinuses/Orbits: The visualized paranasal sinuses and mastoid air cells are clear. Visualized portions of the globes and intraorbital fat are unremarkable. Other: None. IMPRESSION: 1. No acute intracranial abnormality. 2. Atrophy with chronic small vessel white matter ischemic disease. Electronically Signed   By: Kennith Center M.D.   On: 12/07/2022 13:01               LOS: 1 day   Zeina Akkerman  [facility]   Pager on [email or web address]. If 7PM-7AM, please contact night-coverage at [email or web address]     12/08/2022, 9:18 AM

## 2022-12-09 ENCOUNTER — Other Ambulatory Visit (HOSPITAL_COMMUNITY): Payer: Self-pay

## 2022-12-09 DIAGNOSIS — I2699 Other pulmonary embolism without acute cor pulmonale: Secondary | ICD-10-CM | POA: Diagnosis not present

## 2022-12-09 MED ORDER — RIVAROXABAN 20 MG PO TABS: 20.0000 mg | ORAL_TABLET | Freq: Every day | ORAL | Status: DC

## 2022-12-09 MED ORDER — RIVAROXABAN 15 MG PO TABS
15.0000 mg | ORAL_TABLET | Freq: Two times a day (BID) | ORAL | Status: DC
  Filled 2022-12-09: qty 1

## 2022-12-09 MED ORDER — RIVAROXABAN (XARELTO) VTE STARTER PACK (15 & 20 MG): ORAL_TABLET | ORAL | 0 refills | Status: DC

## 2022-12-09 MED ORDER — RIVAROXABAN 15 MG PO TABS
15.0000 mg | ORAL_TABLET | Freq: Two times a day (BID) | ORAL | Status: DC
  Administered 2022-12-09: 15 mg via ORAL
  Filled 2022-12-09: qty 1

## 2022-12-09 NOTE — Progress Notes (Signed)
Progress Note    12/09/2022 9:55 AM * No surgery found *  Subjective:   Joseph Osborn is a 89 y.o. 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   Upon further workup patient was noted to have central and segmental right middle and right lower lobe pulmonary emboli.   Sam this morning patient resting comfortably in bed with his wife and his daughter at the bedside.  Long discussion about treatment therapies for pulmonary embolisms with DVT again this morning.  Family informs me that they would like to pursue just placing the patient back on anticoagulation medication at this time.  Surgery agrees with this at this time.  I was informed by the nurse from yesterday that the patient walked 180 feet around the unit without having to sit down and without being in any major distress.  Patient still remains on room air oxygen.  Daughter informs me there was a conversation between the family and the hospitalist and they have decided to place the patient on Xarelto 15 mg twice daily.  Daughter feels that the patient did not want to go back on Eliquis as he was prior due to the dizziness.  I informed his daughter as well as the patient that I did not believe his dizziness was related to the anticoagulation therapy and he needed continued workup either with his primary care physician or cardiology for his this.  Daughter endorses that the patient has a follow-up appointment with cardiology here at [facility] with Dr. Juliann Pares on August 12th 2024.  This is appropriate at this time.  Will need a 1 month follow-up with vein and vascular for an overall checkup and reevaluation of his DVT.   Vitals:   12/09/22 0401 12/09/22 0913  BP: (!) 156/85 (!) 118/100  Pulse: 95 89  Resp: 20 20  Temp: 98.4 F (36.9 C) 98 F (36.7 C)  SpO2:  90% 90%   Physical Exam: Cardiac:  RRR, S1, S2.  No murmurs appreciated. Lungs: All lung fields clear on auscultation, normal respiratory effort, patient on room air.  No rales, rhonchi or wheezing noted. Incisions: None Extremities: Palpable pulses throughout all extremities. Abdomen: Bowel sounds throughout, soft, nontender and nondistended. Neurologic: Patient is Spanish-speaking only but through an interpreter patient answers all questions appropriately and follows commands.  CBC    Component Value Date/Time   WBC 5.7 12/09/2022 0457   RBC 4.64 12/09/2022 0457   HGB 14.5 12/09/2022 0457   HCT 42.3 12/09/2022 0457   PLT 180 12/09/2022 0457   MCV 91.2 12/09/2022 0457   MCH 31.3 12/09/2022 0457   MCHC 34.3 12/09/2022 0457   RDW 12.7 12/09/2022 0457   LYMPHSABS 1.5 10/06/2022 1135   MONOABS 0.3 10/06/2022 1135   EOSABS 0.2 10/06/2022 1135   BASOSABS 0.1 10/06/2022 1135    BMET    Component Value Date/Time   NA 139 12/08/2022 0444   K 4.7 12/08/2022 0444   CL 104 12/08/2022 0444   CO2 26 12/08/2022 0444   GLUCOSE 98 12/08/2022 0444   BUN 15 12/08/2022 0444   CREATININE 0.87 12/08/2022 0444   CALCIUM 9.0 12/08/2022 0444   GFRNONAA >60 12/08/2022 0444   GFRAA >60 09/08/2017 2300    INR    Component Value Date/Time   INR 1.1 12/07/2022 1619     Intake/Output  Summary (Last 24 hours) at 12/09/2022 0955 Last data filed at 12/08/2022 1800 Gross per 24 hour  Intake --  Output 200 ml  Net -200 ml     Assessment/Plan:  89 y.o. male is s/p fall at home with workup showing pulmonary embolism with DVT.* No surgery found *   PLAN: Convert patient from heparin infusion to oral anticoagulation.  Patient and family decided to be placed on Xarelto 15 mg twice daily indefinitely. Vascular surgery okay patient to discharge today once converted to oral anticoagulation.  Patient to follow-up with vein and vascular as scheduled.  DVT prophylaxis:  Heparin Infusion   Marcie [facility] 12/09/2022 9:55 AM

## 2022-12-09 NOTE — Discharge Summary (Signed)
Physician Discharge Summary   Patient: Joseph Osborn MRN: [id number] DOB: [birth year]  Admit date:     12/07/2022  Discharge date: 12/09/22  Discharge Physician: Lurene Shadow   PCP: Eustaquio Boyden, MD   Recommendations at discharge:   Follow-up with PCP in 1 week Follow-up with Dr. Vanna Scotland, urologist, on 12/21/2022.  Discharge Diagnoses: Principal Problem:   Pulmonary embolism and infarction [facility]) Active Problems:   Hydronephrosis, left   Fall at home, initial encounter   CKD (chronic kidney disease), stage III (HCC)   CAD (coronary artery disease)   COPD (chronic obstructive pulmonary disease) (HCC)   Chronic constipation  Resolved Problems:   * No resolved hospital problems. *  Hospital Course:  Joseph Osborn is a 89 y.o. male with medical history significant for recent DVT (took Eliquis from January 2024 to June 2024), COPD, hypertension, CAD (coronary stent in 2000), CKD stage IIIa, hypothyroidism, chronic venous insufficiency, chronic intermittent dizziness (daughter said he stopped Eliquis because he attributed dizziness to Eliquis), recurrent falls at home.  He presented to the hospital after a fall at home.  He fell onto the toilet and hit the toilet with his left chest and flank area.     Workup in the hospital revealed acute bilateral pulmonary embolism. He was treated with IV heparin drip.      Assessment and Plan:   Acute bilateral pulmonary embolism, pulmonary infarct: He is tolerating room air.   Patient and family have decided against any surgical procedure/thrombectomy. He will be discharged on Xarelto.  He has been advised to take Xarelto with food.  Outpatient follow-up with PCP for refills 2D echo showed normal EF, normal RV size and function, grade 1 diastolic dysfunction.  Follow-up with vascular surgeon.     Chronic left hydronephrosis, enlarged prostate: Urologist recommended outpatient follow-up.  Follow-up with Dr.  Vanna Scotland on 12/21/2022.     CAD with coronary stent in 2000: Continue aspirin.  Myocardial perfusion stress test in 2019 was normal.  Patient is intolerant of statins. Resume atenolol and isosorbide mononitrate.     S/p falls at home, left flank pain from fall: Analgesics as needed for pain.  No skilled PT or OT required at discharge.     Chronic intermittent dizziness: Chart review showed that he was seen on 06/04/2020 for dizziness and differential diagnoses at that time included postural changes versus effect of tamsulosin. No evidence of orthostatic hypotension at this time. Orthostatic vital signs: Lying BP 131/80, sitting BP 130/82, standing BP 120/68.   His condition has improved and he is deemed stable for discharge to home today.  Discharge plan was discussed with the patient, his wife and daughter at the bedside.          Consultants: Vascular surgeon Procedures performed: None  Disposition: Home Diet recommendation:  Discharge Diet Orders (From admission, onward)     Start     Ordered   12/09/22 0000  Diet - low sodium heart healthy        12/09/22 1521           Cardiac diet DISCHARGE MEDICATION: Allergies as of 12/09/2022       Reactions   Shellfish Allergy Anaphylaxis   Oxybutynin Other (See Comments)   Doesn't remember reaction        Medication List     TAKE these medications    acetaminophen 500 MG tablet Commonly known as: TYLENOL Take 2 tablets (1,000 mg total) by mouth in  the morning, at noon, and at bedtime.   atenolol 50 MG tablet Commonly known as: TENORMIN Take 1 tablet (50 mg total) by mouth daily.   azelastine 0.1 % nasal spray Commonly known as: ASTELIN Place 1-2 sprays into both nostrils 2 (two) times daily. Use in each nostril as directed   fluticasone furoate-vilanterol 200-25 MCG/ACT Aepb Commonly known as: Breo Ellipta Inhale 1 puff into the lungs daily.   isosorbide mononitrate 30 MG 24 hr tablet Commonly  known as: IMDUR Take 30 mg by mouth daily.   linaclotide 72 MCG capsule Commonly known as: LINZESS Take 1 capsule (72 mcg total) by mouth every other day.   Rivaroxaban Starter Pack (15 mg and 20 mg) Commonly known as: XARELTO STARTER PACK Follow package directions: Take one 15mg  tablet by mouth twice a day. On day 22, switch to one 20mg  tablet once a day. Take with food.   Vitamin D3 25 MCG (1000 UT) Caps Take 1 capsule (1,000 Units total) by mouth daily.        Discharge Exam: Filed Weights   12/07/22 1157  Weight: 79.4 kg   GEN: NAD SKIN: Warm and dry EYES: EOMI ENT: MMM CV: RRR PULM: CTA B ABD: soft, ND, NT, +BS CNS: AAO x 3, non focal EXT: No edema or tenderness   Condition at discharge: good  The results of significant diagnostics from this hospitalization (including imaging, microbiology, ancillary and laboratory) are listed below for reference.   Imaging Studies: ECHOCARDIOGRAM COMPLETE  Result Date: 12/08/2022    ECHOCARDIOGRAM REPORT   Patient Name:   Joseph Osborn [facility] Date of Exam: 12/07/2022 Medical Rec #:  [id number]              Height:       64.0 in Accession #:    [id number]             Weight:       175.0 lb Date of Birth:  Mar 21, 1926              BSA:          1.848 m Patient Age:    89 years               BP:           125/69 mmHg Patient Gender: M                      HR:           81 bpm. Exam Location:  [facility] Procedure: 2D Echo, Cardiac Doppler and Color Doppler Indications:     I26.09 Pulmonary embolus  History:         Patient has no prior history of Echocardiogram examinations.                  CAD and Previous Myocardial Infarction, COPD; Risk                  Factors:Hypertension.  Sonographer:     Daphine Deutscher RDCS Referring Phys:  [id number] Verdene Lennert Diagnosing Phys: Marcina Millard MD IMPRESSIONS  1. Left ventricular ejection fraction, by estimation, is 65 to 70%. The left ventricle has normal function. The left ventricle has no  regional wall motion abnormalities. Left ventricular diastolic parameters are consistent with Grade I diastolic dysfunction (impaired relaxation).  2. Right ventricular systolic function is normal. The right ventricular size is normal.  3. The mitral valve is normal in structure.  Mild mitral valve regurgitation. No evidence of mitral stenosis.  4. The aortic valve is normal in structure. Aortic valve regurgitation is not visualized. No aortic stenosis is present.  5. The inferior vena cava is normal in size with greater than 50% respiratory variability, suggesting right atrial pressure of 3 mmHg. FINDINGS  Left Ventricle: Left ventricular ejection fraction, by estimation, is 65 to 70%. The left ventricle has normal function. The left ventricle has no regional wall motion abnormalities. The left ventricular internal cavity size was normal in size. There is  no left ventricular hypertrophy. Left ventricular diastolic parameters are consistent with Grade I diastolic dysfunction (impaired relaxation). Right Ventricle: The right ventricular size is normal. No increase in right ventricular wall thickness. Right ventricular systolic function is normal. Left Atrium: Left atrial size was normal in size. Right Atrium: Right atrial size was normal in size. Pericardium: There is no evidence of pericardial effusion. Mitral Valve: The mitral valve is normal in structure. Mild mitral valve regurgitation. No evidence of mitral valve stenosis. Tricuspid Valve: The tricuspid valve is normal in structure. Tricuspid valve regurgitation is mild . No evidence of tricuspid stenosis. Aortic Valve: The aortic valve is normal in structure. Aortic valve regurgitation is not visualized. No aortic stenosis is present. Pulmonic Valve: The pulmonic valve was normal in structure. Pulmonic valve regurgitation is not visualized. No evidence of pulmonic stenosis. Aorta: The aortic root is normal in size and structure. Venous: The inferior vena cava  is normal in size with greater than 50% respiratory variability, suggesting right atrial pressure of 3 mmHg. IAS/Shunts: No atrial level shunt detected by color flow Doppler.  LEFT VENTRICLE PLAX 2D LVIDd:         3.80 cm   Diastology LVIDs:         2.40 cm   LV e' medial:    8.98 cm/s LV PW:         1.00 cm   LV E/e' medial:  7.4 LV IVS:        1.00 cm   LV e' lateral:   10.30 cm/s LVOT diam:     2.30 cm   LV E/e' lateral: 6.4 LV SV:         98 LV SV Index:   53 LVOT Area:     4.15 cm  RIGHT VENTRICLE RV Basal diam:  3.80 cm RV S prime:     15.20 cm/s TAPSE (M-mode): 2.8 cm LEFT ATRIUM             Index        RIGHT ATRIUM          Index LA diam:        4.40 cm 2.38 cm/m   RA Area:     9.93 cm LA Vol (A2C):   26.6 ml 14.39 ml/m  RA Volume:   22.80 ml 12.33 ml/m LA Vol (A4C):   27.9 ml 15.09 ml/m LA Biplane Vol: 28.7 ml 15.53 ml/m  AORTIC VALVE LVOT Vmax:   115.50 cm/s LVOT Vmean:  82.650 cm/s LVOT VTI:    0.237 m  AORTA Ao Root diam: 3.40 cm MITRAL VALVE               TRICUSPID VALVE MV Area (PHT): 3.81 cm    TR Peak grad:   37.2 mmHg MV Decel Time: 199 msec    TR Vmax:        305.00 cm/s MV E velocity: 66.05 cm/s MV A velocity: 91.45  cm/s  SHUNTS MV E/A ratio:  0.72        Systemic VTI:  0.24 m                            Systemic Diam: 2.30 cm Marcina Millard MD Electronically signed by Marcina Millard MD Signature Date/Time: 12/08/2022/1:29:14 PM    Final    CT Angio Chest PE W/Cm &/Or Wo Cm  Result Date: 12/07/2022 CLINICAL DATA:  Multiple falls within the last week, dizziness, block left-sided abdominal trauma EXAM: CT ANGIOGRAPHY CHEST CT ABDOMEN AND PELVIS WITH CONTRAST TECHNIQUE: Multidetector CT imaging of the chest was performed using the standard protocol during bolus administration of intravenous contrast. Multiplanar CT image reconstructions and MIPs were obtained to evaluate the vascular anatomy. Multidetector CT imaging of the abdomen and pelvis was performed using the standard  protocol during bolus administration of intravenous contrast. RADIATION DOSE REDUCTION: This exam was performed according to the departmental dose-optimization program which includes automated exposure control, adjustment of the mA and/or kV according to patient size and/or use of iterative reconstruction technique. CONTRAST:  OMNIPAQUE IOHEXOL 350 MG/ML SOLN COMPARISON:  09/09/2017, 02/02/2016 FINDINGS: CTA CHEST FINDINGS Cardiovascular: This is a technically adequate evaluation of the pulmonary vasculature. On the left, there are central and segmental pulmonary emboli extending from the left main pulmonary artery into the upper and lower lobe branches. On the right, there are central and segmental right middle and right lower lobe pulmonary emboli. Moderate clot burden, with RV/LV ratio measuring 1.3. Findings are consistent with right heart strain. There is prominent right atrial dilatation as well. No pericardial effusion. No evidence of thoracic aortic aneurysm or dissection. Soft atheromatous plaque is seen within the distal aortic arch and proximal descending thoracic aorta, slightly more pronounced since prior study. There is no flow limiting stenosis within the aorta however. Mediastinum/Nodes: No enlarged mediastinal, hilar, or axillary lymph nodes. Thyroid gland, trachea, and esophagus demonstrate no significant findings. Lungs/Pleura: Peripheral wedge-shaped areas of consolidation are seen within the bilateral lower lobes, which may be hypoventilatory or related to developing pulmonary infarcts given the associated pulmonary emboli. Continued follow-up is recommended to document resolution. No acute airspace disease, effusion, or pneumothorax. Left lower lobe bronchial wall thickening. Central airways are patent. Musculoskeletal: There are no acute or destructive bony abnormalities. Reconstructed images demonstrate no additional findings. Review of the MIP images confirms the above findings. CT  ABDOMEN and PELVIS FINDINGS Hepatobiliary: No focal liver abnormality is seen. Status post cholecystectomy. No biliary dilatation. Pancreas: Unremarkable. No pancreatic ductal dilatation or surrounding inflammatory changes. Spleen: No splenic injury or perisplenic hematoma. Adrenals/Urinary Tract: There is moderate to severe left hydronephrosis and hydroureter, with abrupt change in caliber involving the distal left ureter just proximal to the UVJ, reference image 70/11. There is no discrete mass or obstructing calculus in this region. Ureteroscopy may be useful for further evaluation. There is normal excretion of contrast identified in the left kidney on delayed imaging. The right kidney is unremarkable. The adrenals and bladder are unremarkable. Stomach/Bowel: No bowel obstruction or ileus. Normal appendix right lower quadrant. No bowel wall thickening or inflammatory change. Vascular/Lymphatic: Aortic atherosclerosis. No enlarged abdominal or pelvic lymph nodes. Reproductive: Stable marked enlargement of the prostate, which measures 5.3 x 6.4 cm. Other: No free fluid or free intraperitoneal gas. There are multiple fat containing midline supraumbilical ventral hernias which are stable since prior study. Stable fat containing inguinal hernias. No bowel herniation.  Musculoskeletal: No acute or destructive bony abnormalities. Reconstructed images demonstrate no additional findings. Review of the MIP images confirms the above findings. IMPRESSION: Chest: 1. Bilateral central and segmental pulmonary emboli, with CT evidence of right heart strain (RV/LV Ratio = 1.3) consistent with at least submassive (intermediate risk) PE. The presence of right heart strain has been associated with an increased risk of morbidity and mortality. Please refer to the "Code PE Focused" order set in EPIC. 2. Stable soft mural thrombus within the distal aortic arch and proximal descending thoracic aorta, without flow limiting stenosis. No  aneurysm or dissection. 3. Bibasilar peripheral wedge-shaped consolidation. Differential diagnosis would include developing infarct versus pneumonia. Follow-up is recommended to ensure resolution and exclude underlying neoplasm. Abdomen/pelvis: 1. Prominent left-sided hydronephrosis and hydroureter, with abrupt change in caliber of the distal left ureter just proximal to the left UVJ. I do not see any obstructing mass or calculus, and stenosis at the left UV a could be considered. Ureteroscopy may be useful for further evaluation. 2. Marked enlargement the prostate. 3. Fat containing midline ventral and bilateral inguinal hernias. No bowel herniation. Critical Value/emergent results were called by telephone at the time of interpretation on 12/07/2022 at 3:27 pm to provider [facility] , who verbally acknowledged these results. Electronically Signed   By: Sharlet Salina M.D.   On: 12/07/2022 15:27   CT ABDOMEN PELVIS W CONTRAST  Result Date: 12/07/2022 CLINICAL DATA:  Multiple falls within the last week, dizziness, block left-sided abdominal trauma EXAM: CT ANGIOGRAPHY CHEST CT ABDOMEN AND PELVIS WITH CONTRAST TECHNIQUE: Multidetector CT imaging of the chest was performed using the standard protocol during bolus administration of intravenous contrast. Multiplanar CT image reconstructions and MIPs were obtained to evaluate the vascular anatomy. Multidetector CT imaging of the abdomen and pelvis was performed using the standard protocol during bolus administration of intravenous contrast. RADIATION DOSE REDUCTION: This exam was performed according to the departmental dose-optimization program which includes automated exposure control, adjustment of the mA and/or kV according to patient size and/or use of iterative reconstruction technique. CONTRAST:  OMNIPAQUE IOHEXOL 350 MG/ML SOLN COMPARISON:  09/09/2017, 02/02/2016 FINDINGS: CTA CHEST FINDINGS Cardiovascular: This is a technically adequate evaluation of  the pulmonary vasculature. On the left, there are central and segmental pulmonary emboli extending from the left main pulmonary artery into the upper and lower lobe branches. On the right, there are central and segmental right middle and right lower lobe pulmonary emboli. Moderate clot burden, with RV/LV ratio measuring 1.3. Findings are consistent with right heart strain. There is prominent right atrial dilatation as well. No pericardial effusion. No evidence of thoracic aortic aneurysm or dissection. Soft atheromatous plaque is seen within the distal aortic arch and proximal descending thoracic aorta, slightly more pronounced since prior study. There is no flow limiting stenosis within the aorta however. Mediastinum/Nodes: No enlarged mediastinal, hilar, or axillary lymph nodes. Thyroid gland, trachea, and esophagus demonstrate no significant findings. Lungs/Pleura: Peripheral wedge-shaped areas of consolidation are seen within the bilateral lower lobes, which may be hypoventilatory or related to developing pulmonary infarcts given the associated pulmonary emboli. Continued follow-up is recommended to document resolution. No acute airspace disease, effusion, or pneumothorax. Left lower lobe bronchial wall thickening. Central airways are patent. Musculoskeletal: There are no acute or destructive bony abnormalities. Reconstructed images demonstrate no additional findings. Review of the MIP images confirms the above findings. CT ABDOMEN and PELVIS FINDINGS Hepatobiliary: No focal liver abnormality is seen. Status post cholecystectomy. No biliary dilatation. Pancreas: Unremarkable.  No pancreatic ductal dilatation or surrounding inflammatory changes. Spleen: No splenic injury or perisplenic hematoma. Adrenals/Urinary Tract: There is moderate to severe left hydronephrosis and hydroureter, with abrupt change in caliber involving the distal left ureter just proximal to the UVJ, reference image 70/11. There is no discrete  mass or obstructing calculus in this region. Ureteroscopy may be useful for further evaluation. There is normal excretion of contrast identified in the left kidney on delayed imaging. The right kidney is unremarkable. The adrenals and bladder are unremarkable. Stomach/Bowel: No bowel obstruction or ileus. Normal appendix right lower quadrant. No bowel wall thickening or inflammatory change. Vascular/Lymphatic: Aortic atherosclerosis. No enlarged abdominal or pelvic lymph nodes. Reproductive: Stable marked enlargement of the prostate, which measures 5.3 x 6.4 cm. Other: No free fluid or free intraperitoneal gas. There are multiple fat containing midline supraumbilical ventral hernias which are stable since prior study. Stable fat containing inguinal hernias. No bowel herniation. Musculoskeletal: No acute or destructive bony abnormalities. Reconstructed images demonstrate no additional findings. Review of the MIP images confirms the above findings. IMPRESSION: Chest: 1. Bilateral central and segmental pulmonary emboli, with CT evidence of right heart strain (RV/LV Ratio = 1.3) consistent with at least submassive (intermediate risk) PE. The presence of right heart strain has been associated with an increased risk of morbidity and mortality. Please refer to the "Code PE Focused" order set in EPIC. 2. Stable soft mural thrombus within the distal aortic arch and proximal descending thoracic aorta, without flow limiting stenosis. No aneurysm or dissection. 3. Bibasilar peripheral wedge-shaped consolidation. Differential diagnosis would include developing infarct versus pneumonia. Follow-up is recommended to ensure resolution and exclude underlying neoplasm. Abdomen/pelvis: 1. Prominent left-sided hydronephrosis and hydroureter, with abrupt change in caliber of the distal left ureter just proximal to the left UVJ. I do not see any obstructing mass or calculus, and stenosis at the left UV a could be considered. Ureteroscopy  may be useful for further evaluation. 2. Marked enlargement the prostate. 3. Fat containing midline ventral and bilateral inguinal hernias. No bowel herniation. Critical Value/emergent results were called by telephone at the time of interpretation on 12/07/2022 at 3:27 pm to provider [facility] , who verbally acknowledged these results. Electronically Signed   By: Sharlet Salina M.D.   On: 12/07/2022 15:27   DG Chest 2 View  Result Date: 12/07/2022 CLINICAL DATA:  Multiple falls. EXAM: CHEST - 2 VIEW COMPARISON:  02/21/2022 FINDINGS: The cardio pericardial silhouette is enlarged. Interstitial markings are diffusely coarsened with chronic features. Basilar atelectasis or scarring with small bilateral pleural effusions. Bones are diffusely demineralized. IMPRESSION: Chronic interstitial coarsening with small bilateral pleural effusions and basilar atelectasis or scarring. Electronically Signed   By: Kennith Center M.D.   On: 12/07/2022 13:01   CT HEAD WO CONTRAST ( )  Result Date: 12/07/2022 CLINICAL DATA:  Multiple falls. EXAM: CT HEAD WITHOUT CONTRAST TECHNIQUE: Contiguous axial images were obtained from the base of the skull through the vertex without intravenous contrast. RADIATION DOSE REDUCTION: This exam was performed according to the departmental dose-optimization program which includes automated exposure control, adjustment of the mA and/or kV according to patient size and/or use of iterative reconstruction technique. COMPARISON:  10/06/2022 FINDINGS: Brain: There is no evidence for acute hemorrhage, hydrocephalus, mass lesion, or abnormal extra-axial fluid collection. No definite CT evidence for acute infarction. Diffuse loss of parenchymal volume is consistent with atrophy. Patchy low attenuation in the deep hemispheric and periventricular white matter is nonspecific, but likely reflects chronic microvascular ischemic demyelination.  Vascular: No hyperdense vessel or unexpected calcification.  Skull: No evidence for fracture. No worrisome lytic or sclerotic lesion. Sinuses/Orbits: The visualized paranasal sinuses and mastoid air cells are clear. Visualized portions of the globes and intraorbital fat are unremarkable. Other: None. IMPRESSION: 1. No acute intracranial abnormality. 2. Atrophy with chronic small vessel white matter ischemic disease. Electronically Signed   By: Kennith Center M.D.   On: 12/07/2022 13:01    Microbiology: Results for orders placed or performed during the hospital encounter of 04/21/21  Resp Panel by RT-PCR (Flu A&B, Covid)     Status: Abnormal   Collection Time: 04/21/21  2:58 PM  Result Value Ref Range Status   SARS Coronavirus 2 by RT PCR POSITIVE (A) NEGATIVE Final    Comment: (NOTE) SARS-CoV-2 target nucleic acids are DETECTED.  The SARS-CoV-2 RNA is generally detectable in upper respiratory specimens during the acute phase of infection. Positive results are indicative of the presence of the identified virus, but do not rule out bacterial infection or co-infection with other pathogens not detected by the test. Clinical correlation with patient history and other diagnostic information is necessary to determine patient infection status. The expected result is Negative.  Fact Sheet for Patients: BloggerCourse.com  Fact Sheet for Healthcare Providers: SeriousBroker.it  This test is not yet approved or cleared by the [place] FDA and  has been authorized for detection and/or diagnosis of SARS-CoV-2 by FDA under an Emergency Use Authorization (EUA).  This EUA will remain in effect (meaning this test can be used) for the duration of  the COVID-19 declaration under Section 564(b)(1) of the A ct, 21 U.S.C. section 360bbb-3(b)(1), unless the authorization is terminated or revoked sooner.     Influenza A by PCR NEGATIVE NEGATIVE Final   Influenza B by PCR NEGATIVE NEGATIVE Final    Comment:  (NOTE) The Xpert Xpress SARS-CoV-2/FLU/RSV plus assay is intended as an aid in the diagnosis of influenza from Nasopharyngeal swab specimens and should not be used as a sole basis for treatment. Nasal washings and aspirates are unacceptable for Xpert Xpress SARS-CoV-2/FLU/RSV testing.  Fact Sheet for Patients: BloggerCourse.com  Fact Sheet for Healthcare Providers: SeriousBroker.it  This test is not yet approved or cleared by the [place] FDA and has been authorized for detection and/or diagnosis of SARS-CoV-2 by FDA under an Emergency Use Authorization (EUA). This EUA will remain in effect (meaning this test can be used) for the duration of the COVID-19 declaration under Section 564(b)(1) of the Act, 21 U.S.C. section 360bbb-3(b)(1), unless the authorization is terminated or revoked.  Performed at [facility], [address] [place], [place] [address]   MRSA Next Gen by PCR, Nasal     Status: None   Collection Time: 04/22/21  1:50 PM   Specimen: Nasal Mucosa; Nasal Swab  Result Value Ref Range Status   MRSA by PCR Next Gen NOT DETECTED NOT DETECTED Final    Comment: (NOTE) The GeneXpert MRSA Assay (FDA approved for NASAL specimens only), is one component of a comprehensive MRSA colonization surveillance program. It is not intended to diagnose MRSA infection nor to guide or monitor treatment for MRSA infections. Test performance is not FDA approved in patients less than 3 years old. Performed at [facility], [address] [place], [place] [address]     Labs: CBC: Recent Labs  Lab 12/07/22 1159 12/08/22 0444 12/09/22 0457  WBC 5.9 5.3 5.7  HGB 14.8 14.3 14.5  HCT 45.1 41.5 42.3  MCV  93.8 91.2 91.2  PLT 154 139* 180   Basic Metabolic Panel: Recent Labs  Lab 12/07/22 1159 12/08/22 0444  NA 134* 139  K 4.7 4.7  CL 102 104  CO2 25 26  GLUCOSE 111* 98  BUN 17 15  CREATININE 1.03  0.87  CALCIUM 8.7* 9.0   Liver Function Tests: No results for input(s): "AST", "ALT", "ALKPHOS", "BILITOT", "PROT", "ALBUMIN" in the last 168 hours. CBG: No results for input(s): "GLUCAP" in the last 168 hours.  Discharge time spent: greater than 30 minutes.  Signed: Lurene Shadow, MD [facility] 12/09/2022

## 2022-12-09 NOTE — Evaluation (Signed)
Physical Therapy Evaluation Patient Details Name: Joseph Osborn MRN: [id number] DOB: Sep 21, 1925 Today's Date: 12/09/2022  History of Present Illness  89 y.o. male medical history significant for recent DVT (took Eliquis from January 2024 to June 2024), COPD, hypertension, CAD (coronary stent in 2000), CKD stage IIIa, hypothyroidism, chronic venous insufficiency, chronic intermittent dizziness, who presents to the ED following an episode of syncope and fall. Workup revealed acute bilateral pulmonary embolism.  Clinical Impression  Pt received in bed, he is agreeable to PT session. Pt performed independent bed mobility, transfers, and amb with supA for safety. Pt able to amb ~300' with RW with HR 66bpm, SpO2% 97% and no reports of SOB, dizziness, or chest pain. Pt will benefit from mobility specialist to work on increasing gait distance. No skilled PT needed and discharging today 12/09/22.         If plan is discharge home, recommend the following: A little help with walking and/or transfers;A little help with bathing/dressing/bathroom;Assistance with cooking/housework;Assist for transportation;Help with stairs or ramp for entrance   Can travel by private vehicle        Equipment Recommendations None recommended by PT  Recommendations for Other Services       Functional Status Assessment Patient has not had a recent decline in their functional status     Precautions / Restrictions Precautions Precautions: Fall Restrictions Weight Bearing Restrictions: No      Mobility  Bed Mobility Overal bed mobility: Modified Independent                  Transfers Overall transfer level: Needs assistance Equipment used: Rolling walker (2 wheels) Transfers: Sit to/from Stand Sit to Stand: Supervision           General transfer comment: Pt required sup A for transfer and overall mobility for safety    Ambulation/Gait Ambulation/Gait assistance: Modified independent  (Device/Increase time), Supervision Gait Distance (Feet): 300 Feet Assistive device: Rolling walker (2 wheels) Gait Pattern/deviations: WFL(Within Functional Limits), Step-through pattern, Decreased stride length Gait velocity: WNL     General Gait Details: Cues for proper use of RW to promote safety, SpO2 97% with HR 66 bpm  Stairs            Wheelchair Mobility     Tilt Bed    Modified Rankin (Stroke Patients Only)       Balance Overall balance assessment: History of Falls, Needs assistance Sitting-balance support: No upper extremity supported, Feet supported Sitting balance-Leahy Scale: Good     Standing balance support: Reliant on assistive device for balance Standing balance-Leahy Scale: Good                               Pertinent Vitals/Pain Pain Assessment Pain Assessment: Faces Faces Pain Scale: Hurts a little bit Pain Location: L side Pain Descriptors / Indicators: Aching Pain Intervention(s): Monitored during session, Premedicated before session, Other (comment) (Pt reports having patch on L lumbar near paraspinals for pain control)    Home Living Family/patient expects to be discharged to:: Private residence Living Arrangements: Children;Spouse/significant other Available Help at Discharge: Family;Available 24 hours/day Type of Home: House Home Access: Ramped entrance       Home Layout: One level Home Equipment: Agricultural consultant (2 wheels);Rollator (4 wheels);Shower seat - built in;Shower seat      Prior Function Prior Level of Function : Independent/Modified Independent;History of Falls (last six months)  Mobility Comments: Pt was ambulatory in the home wiht only PRN RW recently,  typically using a rollator for community mobility. Daughter endorses pt has had 3-4 falls in past 75mo all resulting from dizziness but does not lose consciousness. ADLs Comments: Daughter confirms Pt was independent with ADL and medication  mgt.     Hand Dominance        Extremity/Trunk Assessment   Upper Extremity Assessment Upper Extremity Assessment: Overall WFL for tasks assessed    Lower Extremity Assessment Lower Extremity Assessment: Overall WFL for tasks assessed       Communication   Communication: Prefers language other than [place];Other (comment) (Spanish speaking)  Cognition Arousal/Alertness: Awake/alert Behavior During Therapy: WFL for tasks assessed/performed Overall Cognitive Status: Within Functional Limits for tasks assessed                                          General Comments      Exercises     Assessment/Plan    PT Assessment Patient does not need any further PT services  PT Problem List         PT Treatment Interventions      PT Goals (Current goals can be found in the Care Plan section)  Acute Rehab PT Goals Patient Stated Goal: get better to go home PT Goal Formulation: With patient Time For Goal Achievement: 12/23/22 Potential to Achieve Goals: Good    Frequency       Co-evaluation PT/OT/SLP Co-Evaluation/Treatment: Yes Reason for Co-Treatment: To address functional/ADL transfers PT goals addressed during session: Mobility/safety with mobility OT goals addressed during session: ADL's and self-care       AM-PAC PT "6 Clicks" Mobility  Outcome Measure Help needed turning from your back to your side while in a flat bed without using bedrails?: None Help needed moving from lying on your back to sitting on the side of a flat bed without using bedrails?: None Help needed moving to and from a bed to a chair (including a wheelchair)?: None Help needed standing up from a chair using your arms (e.g., wheelchair or bedside chair)?: None Help needed to walk in hospital room?: None Help needed climbing 3-5 steps with a railing? : A Little 6 Click Score: 23    End of Session Equipment Utilized During Treatment: Gait belt Activity Tolerance: Patient  tolerated treatment well Patient left: in chair;with call bell/phone within reach;with family/visitor present Nurse Communication: Mobility status PT Visit Diagnosis: Repeated falls (R29.6);Pain;History of falling (Z91.81) Pain - Right/Left: Left Pain - part of body:  (back)    Time: 1610-9604 PT Time Calculation (min) (ACUTE ONLY): 12 min   Charges:   PT Evaluation $PT Eval Low Complexity: 1 Low   PT General Charges $$ ACUTE PT VISIT: 1 Visit          , PT, GCS 12/09/22,12:31 PM

## 2022-12-09 NOTE — Consult Note (Signed)
ANTICOAGULATION CONSULT NOTE  Pharmacy Consult for heparin infusion Indication: pulmonary embolus  Allergies  Allergen Reactions   Shellfish Allergy Anaphylaxis   Oxybutynin Other (See Comments)    Doesn't remember reaction    Patient Measurements: Height: 5\' 4"  (162.6 cm) Weight: 79.4 kg (175 lb) IBW/kg (Calculated) : 59.2 Heparin Dosing Weight: 75.6 kg  Vital Signs: Temp: 98.4 F (36.9 C) (08/02 0401) Temp Source: Oral (08/02 0401) BP: 156/85 (08/02 0401) Pulse Rate: 95 (08/02 0401)  Labs: Recent Labs    12/07/22 1159 12/07/22 1456 12/07/22 1619 12/08/22 0019 12/08/22 0444 12/08/22 0836 12/08/22 1903 12/09/22 0457  HGB 14.8  --   --   --  14.3  --   --  14.5  HCT 45.1  --   --   --  41.5  --   --  42.3  PLT 154  --   --   --  139*  --   --  180  APTT  --   --  27  --   --   --   --   --   LABPROT  --   --  14.6  --   --   --   --   --   INR  --   --  1.1  --   --   --   --   --   HEPARINUNFRC  --   --   --    < >  --  0.78* 0.70 0.64  CREATININE 1.03  --   --   --  0.87  --   --   --   TROPONINIHS 5 6  --   --   --   --   --   --    < > = values in this interval not displayed.    Estimated Creatinine Clearance: 48.3 mL/min (by C-G formula based on SCr of 0.87 mg/dL).   Medical History: Past Medical History:  Diagnosis Date   Arthritis    Basal cell carcinoma 02/04/2021   L groin, excised 03/23/21   BPH (benign prostatic hyperplasia)    CAD (coronary artery disease)    Chronic kidney disease    Stage III   Community acquired pneumonia 04/21/2021   Post COVID CAP s/p hospitalization   COPD (chronic obstructive pulmonary disease) (HCC)    COVID-19 04/13/2021   DDD (degenerative disc disease), cervical    DVT (deep venous thrombosis) (HCC) 05/30/2022   Left leg   Hypertension    Lipoma 2017   Lower back pain    Myocardial infarction (HCC)    approx 2000   Thyroid disease    Hypothyroidism   Wears dentures    partial upper    Medications:   Patient was on Eliquis for DVT but this was stopped in June.  Assessment: 89 yo male presented to the ED after syncopal event. Patient has history of DVT that was treatment with Eliquis which was stopped in June. CT positive for bilateral pulmonary embolus.  Pharmacy consulted to start heparin infusion.    Goal of Therapy:  Heparin level 0.3-0.7 units/ml Monitor platelets by anticoagulation protocol: Yes    8/1@0019 , HL=0.62, therapeutic X 1 8/01 0836 HL 0.78 8/01 1903 HL 0.70, therapeutic x 1 8/02 0457 HL 0.64, therapeutic X 2   Plan:  - Continue heparin infusion at 1100 units/hr - Recheck HL in w/ AM lab to confirm - CBC/H&H daily  , D 12/09/2022 5:49 AM

## 2022-12-09 NOTE — TOC Benefit Eligibility Note (Signed)
Pharmacy Patient Advocate Encounter  Insurance verification completed.    The patient is insured through [facility] Medicare Part D  Ran test claim for Xarelto 20 mg and the current 30 day co-pay is $4.60.   This test claim was processed through [facility]- copay amounts may vary at other pharmacies due to pharmacy/plan contracts, or as the patient moves through the different stages of their insurance plan.    Roland Earl, CPHT Pharmacy Patient Advocate Specialist [facility] Patient Advocate Team Direct Number: [phone]  Fax: [phone]

## 2022-12-09 NOTE — Evaluation (Signed)
Occupational Therapy Evaluation Patient Details Name: Joseph Osborn MRN: [id number] DOB: Nov 25, 1925 Today's Date: 12/09/2022   History of Present Illness 89 y.o. male medical history significant for recent DVT (took Eliquis from January 2024 to June 2024), COPD, hypertension, CAD (coronary stent in 2000), CKD stage IIIa, hypothyroidism, chronic venous insufficiency, chronic intermittent dizziness, who presents to the ED following an episode of syncope and fall. Workup revealed acute bilateral pulmonary embolism.   Clinical Impression   Pt was seen for OT evaluation this date. Prior to hospital admission, pt was independent with ADL, using rollator for community mobility and more recently using a 2WW in the home PRN 2/2 falls. Pt lives with his spouse and his daughter. Pt presents to acute OT demonstrating impaired ADL performance and functional mobility 2/2 mild balance deficits but functionally near baseline modified independence for ADL and mobility (See OT problem list for additional functional deficits). Pt/family deny concerns. No acute OT needs identified. Do not anticipate the need for follow up OT services upon acute hospital DC.    Recommendations for follow up therapy are one component of a multi-disciplinary discharge planning process, led by the attending physician.  Recommendations may be updated based on patient status, additional functional criteria and insurance authorization.   Assistance Recommended at Discharge PRN  Patient can return home with the following A little help with walking and/or transfers;A little help with bathing/dressing/bathroom;Assistance with cooking/housework;Assist for transportation;Help with stairs or ramp for entrance    Functional Status Assessment  Patient has not had a recent decline in their functional status  Equipment Recommendations  None recommended by OT    Recommendations for Other Services       Precautions / Restrictions  Precautions Precautions: Fall Restrictions Weight Bearing Restrictions: No      Mobility Bed Mobility Overal bed mobility: Modified Independent                  Transfers Overall transfer level: Needs assistance Equipment used: Rolling walker (2 wheels) Transfers: Sit to/from Stand Sit to Stand: Supervision, Min guard                  Balance Overall balance assessment: History of Falls, Needs assistance Sitting-balance support: No upper extremity supported, Feet supported Sitting balance-Leahy Scale: Good     Standing balance support: Reliant on assistive device for balance Standing balance-Leahy Scale: Good                             ADL either performed or assessed with clinical judgement   ADL Overall ADL's : Modified independent                                             Vision         Perception     Praxis      Pertinent Vitals/Pain Pain Assessment Pain Assessment: Faces Faces Pain Scale: Hurts a little bit Pain Location: L side Pain Descriptors / Indicators: Aching Pain Intervention(s): Monitored during session, Repositioned     Hand Dominance     Extremity/Trunk Assessment Upper Extremity Assessment Upper Extremity Assessment: Overall WFL for tasks assessed   Lower Extremity Assessment Lower Extremity Assessment: Overall WFL for tasks assessed       Communication Communication Communication: Prefers language other than [place];Other (comment) (daughter interpreted)  Cognition Arousal/Alertness: Awake/alert Behavior During Therapy: WFL for tasks assessed/performed Overall Cognitive Status: Within Functional Limits for tasks assessed                                       General Comments       Exercises Other Exercises Other Exercises: Pt/family educated in falls prevention and use of shower chair for seated shower   Shoulder Instructions      Home Living Family/patient  expects to be discharged to:: Private residence Living Arrangements: Children;Spouse/significant other (wife and daughter) Available Help at Discharge: Family;Available 24 hours/day Type of Home: House Home Access: Ramped entrance     Home Layout: One level     Bathroom Shower/Tub: Producer, television/film/video: Standard     Home Equipment: Agricultural consultant (2 wheels);Rollator (4 wheels);Shower seat - built in;Shower seat          Prior Functioning/Environment Prior Level of Function : Independent/Modified Independent;History of Falls (last six months)             Mobility Comments: Pt was ambulatory in the home wiht only PRN RW recently,  typically using a rollator for community mobility. Dtr endorses pt has had 3-4 falls in past 92mo all resulting from dizziness but does not lose consciousness. ADLs Comments: Dtr confirms pt was independent with ADL and medication mgt.        OT Problem List: Impaired balance (sitting and/or standing)      OT Treatment/Interventions:      OT Goals(Current goals can be found in the care plan section) Acute Rehab OT Goals Patient Stated Goal: go home OT Goal Formulation: All assessment and education complete, DC therapy  OT Frequency:      Co-evaluation PT/OT/SLP Co-Evaluation/Treatment: Yes Reason for Co-Treatment: To address functional/ADL transfers PT goals addressed during session: Mobility/safety with mobility OT goals addressed during session: ADL's and self-care      AM-PAC OT "6 Clicks" Daily Activity     Outcome Measure Help from another person eating meals?: None Help from another person taking care of personal grooming?: None Help from another person toileting, which includes using toliet, bedpan, or urinal?: A Little Help from another person bathing (including washing, rinsing, drying)?: A Little Help from another person to put on and taking off regular upper body clothing?: None Help from another person to put on  and taking off regular lower body clothing?: None 6 Click Score: 22   End of Session Equipment Utilized During Treatment: Gait belt;Rolling walker (2 wheels)  Activity Tolerance: Patient tolerated treatment well Patient left: in chair;with call bell/phone within reach;with family/visitor present  OT Visit Diagnosis: Other abnormalities of gait and mobility (R26.89);Repeated falls (R29.6)                Time: 5784-6962 OT Time Calculation (min): 11 min Charges:  OT General Charges $OT Visit: 1 Visit OT Evaluation $OT Eval Low Complexity: 1 Low  Arman Filter., MPH, MS, OTR/L ascom [phone] 12/09/22, 11:53 AM

## 2022-12-09 NOTE — Consult Note (Signed)
ANTICOAGULATION CONSULT NOTE  Pharmacy Consult for: Transition from heparin infusion to Xarelto Indication: acute pulmonary embolus  Allergies  Allergen Reactions   Shellfish Allergy Anaphylaxis   Oxybutynin Other (See Comments)    Doesn't remember reaction    Patient Measurements: Height: 5\' 4"  (162.6 cm) Weight: 79.4 kg (175 lb) IBW/kg (Calculated) : 59.2  Vital Signs: Temp: 98.3 F (36.8 C) (08/02 1145) Temp Source: Oral (08/02 0401) BP: 129/69 (08/02 1145) Pulse Rate: 96 (08/02 1145)  Labs: Recent Labs    12/07/22 1159 12/07/22 1456 12/07/22 1619 12/08/22 0019 12/08/22 0444 12/08/22 0836 12/08/22 1903 12/09/22 0457  HGB 14.8  --   --   --  14.3  --   --  14.5  HCT 45.1  --   --   --  41.5  --   --  42.3  PLT 154  --   --   --  139*  --   --  180  APTT  --   --  27  --   --   --   --   --   LABPROT  --   --  14.6  --   --   --   --   --   INR  --   --  1.1  --   --   --   --   --   HEPARINUNFRC  --   --   --    < >  --  0.78* 0.70 0.64  CREATININE 1.03  --   --   --  0.87  --   --   --   TROPONINIHS 5 6  --   --   --   --   --   --    < > = values in this interval not displayed.    Estimated Creatinine Clearance: 48.3 mL/min (by C-G formula based on SCr of 0.87 mg/dL).   Medical History: Past Medical History:  Diagnosis Date   Arthritis    Basal cell carcinoma 02/04/2021   L groin, excised 03/23/21   BPH (benign prostatic hyperplasia)    CAD (coronary artery disease)    Chronic kidney disease    Stage III   Community acquired pneumonia 04/21/2021   Post COVID CAP s/p hospitalization   COPD (chronic obstructive pulmonary disease) (HCC)    COVID-19 04/13/2021   DDD (degenerative disc disease), cervical    DVT (deep venous thrombosis) (HCC) 05/30/2022   Left leg   Hypertension    Lipoma 2017   Lower back pain    Myocardial infarction (HCC)    approx 2000   Thyroid disease    Hypothyroidism   Wears dentures    partial upper    Medications:   Patient was on Eliquis for DVT but this was stopped in June.  Assessment: 89 yo male presented to the ED after syncopal event. Patient has history of DVT that was treatment with Eliquis which was stopped in June. CT positive for bilateral pulmonary embolus.  Pharmacy consulted to transition from heparin infusion to Xarelto.  Plan:  Heparin infusion already DC by provider - Star Xarelto 15mg  BID x 21 day (1st dsoe now), then xarelto 20mg  daily with supper.   Rodriguez-Guzman PharmD, BCPS 12/09/2022 2:32 PM

## 2022-12-12 ENCOUNTER — Telehealth: Payer: Self-pay | Admitting: *Deleted

## 2022-12-12 NOTE — Transitions of Care (Post Inpatient/ED Visit) (Signed)
12/12/2022  Name: Joseph Osborn MRN: [id number] DOB: 10/14/1925  Today's TOC FU Call Status: Today's TOC FU Call Status:: Successful TOC FU Call Completed TOC FU Call Complete Date: 12/12/22 RN used [facility]  Spanish Interpreter [phone] Transition Care Management Follow-up Telephone Call Date of Discharge: 12/09/22 Discharge Facility: [facility]) Type of Discharge: Inpatient Admission Primary Inpatient Discharge Diagnosis:: Pulmonary embolism and infarction How have you been since you were released from the hospital?: Better Any questions or concerns?: No  Items Reviewed: Did you receive and understand the discharge instructions provided?: Yes Medications obtained,verified, and reconciled?: Yes (Medications Reviewed) Any new allergies since your discharge?: No Dietary orders reviewed?: No Do you have support at home?: Yes People in Home: child(ren), adult, spouse Name of Support/Comfort Primary Source: Joseph Osborn  Medications Reviewed Today: Medications Reviewed Today     Reviewed by Luella Cook, RN (Case Manager) on 12/12/22 at 1626  Med List Status: <None>   Medication Order Taking? Sig Documenting Provider Last Dose Status Informant  acetaminophen (TYLENOL) 500 MG tablet [id number] Yes Take 2 tablets (1,000 mg total) by mouth in the morning, at noon, and at bedtime. Eustaquio Boyden, MD Taking Active   atenolol (TENORMIN) 50 MG tablet [id number] Yes Take 1 tablet (50 mg total) by mouth daily. Eustaquio Boyden, MD Taking Active   azelastine (ASTELIN) 0.1 % nasal spray [id number] Yes Place 1-2 sprays into both nostrils 2 (two) times daily. Use in each nostril as directed Eustaquio Boyden, MD Taking Active   Cholecalciferol (VITAMIN D3) 25 MCG (1000 UT) capsule [id number] Yes Take 1 capsule (1,000 Units total) by mouth daily. Eustaquio Boyden, MD Taking Active   fluticasone furoate-vilanterol (BREO ELLIPTA) 200-25 MCG/ACT AEPB [id number]  Inhale  1 puff into the lungs daily. Eustaquio Boyden, MD  Active            Med Note Carlena Sax, Lise Auer   Thu Dec 08, 2022 10:38 AM) Patient self discontinued-says its not helping and making him feel worse   isosorbide mononitrate (IMDUR) 30 MG 24 hr tablet [id number] Yes Take 30 mg by mouth daily. [provider] Taking Active   linaclotide Karlene Einstein) 72 MCG capsule [id number] Yes Take 1 capsule (72 mcg total) by mouth every other day. Eustaquio Boyden, MD Taking Active   RIVAROXABAN Carlena Hurl) VTE STARTER PACK [phone] & 20 MG) [id number] Yes Follow package directions: Take one 15mg  tablet by mouth twice a day. On day 22, switch to one 20mg  tablet once a day. Take with food. Lurene Shadow, MD Taking Active             Home Care and Equipment/Supplies: Were Home Health Services Ordered?: NA Any new equipment or medical supplies ordered?: NA  Functional Questionnaire: Do you need assistance with bathing/showering or dressing?: Yes Do you need assistance with meal preparation?: Yes Do you need assistance with eating?: No Do you have difficulty maintaining continence: No Do you need assistance with getting out of bed/getting out of a chair/moving?: No Do you have difficulty managing or taking your medications?: Yes  Follow up appointments reviewed: PCP Follow-up appointment confirmed?: Yes Date of PCP follow-up appointment?: 12/14/22 Follow-up Provider: Dr Sharen Hones [facility] Follow-up appointment confirmed?: Yes Date of Specialist follow-up appointment?: 12/21/22 Follow-Up Specialty Provider:: Dr Reece Agar Do you need transportation to your follow-up appointment?: No Do you understand care options if your condition(s) worsen?: Yes-patient verbalized understanding  SDOH Interventions Today    Flowsheet Row Most Recent Value  SDOH Interventions  Food Insecurity Interventions Intervention Not Indicated  Housing Interventions Intervention Not Indicated  Transportation  Interventions Intervention Not Indicated, Patient Resources (Friends/Family)  [Daughter takes him to visits]      Interventions Today    Flowsheet Row Most Recent Value  General Interventions   General Interventions Discussed/Reviewed General Interventions Discussed, General Interventions Reviewed, Doctor Visits  Doctor Visits Discussed/Reviewed Doctor Visits Discussed, Doctor Visits Reviewed  [Daughter made aware of the urology visit.]  Pharmacy Interventions   Pharmacy Dicussed/Reviewed Pharmacy Topics Discussed, Pharmacy Topics Reviewed  [Daughter was able to pick up medications today]      TOC Interventions Today    Flowsheet Row Most Recent Value  TOC Interventions   TOC Interventions Discussed/Reviewed TOC Interventions Discussed, TOC Interventions Reviewed        Gean Maidens BSN RN [facility] [phone]

## 2022-12-14 ENCOUNTER — Encounter: Payer: Self-pay | Admitting: Family Medicine

## 2022-12-14 ENCOUNTER — Ambulatory Visit (INDEPENDENT_AMBULATORY_CARE_PROVIDER_SITE_OTHER): Payer: Medicare Other | Admitting: Family Medicine

## 2022-12-14 VITALS — BP 138/82 | HR 75 | Temp 97.9°F | Ht 64.0 in | Wt 173.1 lb

## 2022-12-14 DIAGNOSIS — R2681 Unsteadiness on feet: Secondary | ICD-10-CM

## 2022-12-14 DIAGNOSIS — N401 Enlarged prostate with lower urinary tract symptoms: Secondary | ICD-10-CM | POA: Diagnosis not present

## 2022-12-14 DIAGNOSIS — R3912 Poor urinary stream: Secondary | ICD-10-CM

## 2022-12-14 DIAGNOSIS — J310 Chronic rhinitis: Secondary | ICD-10-CM

## 2022-12-14 DIAGNOSIS — N1330 Unspecified hydronephrosis: Secondary | ICD-10-CM | POA: Diagnosis not present

## 2022-12-14 DIAGNOSIS — M1711 Unilateral primary osteoarthritis, right knee: Secondary | ICD-10-CM

## 2022-12-14 DIAGNOSIS — I2699 Other pulmonary embolism without acute cor pulmonale: Secondary | ICD-10-CM

## 2022-12-14 DIAGNOSIS — Z86718 Personal history of other venous thrombosis and embolism: Secondary | ICD-10-CM

## 2022-12-14 DIAGNOSIS — R296 Repeated falls: Secondary | ICD-10-CM

## 2022-12-14 NOTE — Patient Instructions (Addendum)
Lo remitiremos a terapia fiscia en casa.  Siga Xarelto diario indefinitivamente.  Esperaremos evaluacion de Paediatric nurse.  Gusto verlo hoy.

## 2022-12-14 NOTE — Progress Notes (Signed)
Ph: [phone] Fax: [phone]   Patient ID: Joseph Osborn, male    DOB: April 28, 1926, 89 y.o.   MRN: [id number]  This visit was conducted in person.  BP 138/82   Pulse 75   Temp 97.9 F (36.6 C) (Temporal)   Ht 5\' 4"  (1.626 m)   Wt 173 lb 2 oz (78.5 kg)   SpO2 95%   BMI 29.72 kg/m   BP Readings from Last 3 Encounters:  12/14/22 138/82  12/09/22 135/78  11/07/22 124/76    Pulse Readings from Last 3 Encounters:  12/14/22 75  12/09/22 91  11/07/22 63  Orthostatic Vitals for the past 48 hrs (Last 6 readings):  Patient Position Orthostatic BP BP Pulse  12/14/22 1518 -- -- 138/82 75  12/14/22 1619 Sitting 150/76 -- --  12/14/22 1623 Standing 144/74 -- --   CC: hosp f/u visit  Subjective:   HPI: Joseph Osborn is a 89 y.o. male presenting on 12/14/2022 for Hospitalization Follow-up (Admitted on 12/07/22 at [facility], dx fall; dizzy. Pt accompanied by daughter, Joseph Osborn.)   Primary [phone] Judie Petit) daughter Joseph Osborn - request to be called for all appointments  Recent hospitalization for dizziness with fall at home, hit toilet with L chest and flank area. Hospital evaluation revealed bilateral pulm embolism with evidence of pulm infarct, treated with IV heparin drip. They declined surgical treatment of PEs. Discharged on xarelto 15mg  BID x 3 wks then 20mg  daily with food. Also found to have chronic L hydronephrosis with enlarged prostate, planned outpatient urology follow up.  Hospital records reviewed. Med rec performed.  Echocardiogram reassuringly without R heart strain.   3rd fall at home in last few weeks.  Continues very unsteady with ambulation. Using walker at home.  Using lidocaine patches for back pain after fall.  Predominant trouble is ambulation - no trouble when seated or supine.   Late to fill Xarelto - just restarted today - was taking leftover eliquis, 10mg  BID over weekend prior to starting xarelto.   Upcoming urology evaluation for incidental  finding of chronic left hydroureter on imaging during recent hospital visit.   Home health not set up.  Other follow up appointments scheduled: 12/19/2022 Juliann Pares), 12/21/2022 Apolinar Junes) ______________________________________________________________________ Hospital admission: 12/07/2022 Hospital discharge: 12/09/2022 TCM f/u phone call:  performed on 12/12/2022.   Recommendations at discharge:  Follow-up with PCP in 1 week Follow-up with Dr. Vanna Scotland, urologist, on 12/21/2022.   Discharge Diagnoses: Principal Problem:   Pulmonary embolism and infarction [facility]) Active Problems:   Hydronephrosis, left   Fall at home, initial encounter   CKD (chronic kidney disease), stage III (HCC)   CAD (coronary artery disease)   COPD (chronic obstructive pulmonary disease) (HCC)   Chronic constipation     Relevant past medical, surgical, family and social history reviewed and updated as indicated. Interim medical history since our last visit reviewed. Allergies and medications reviewed and updated. Outpatient Medications Prior to Visit  Medication Sig Dispense Refill   acetaminophen (TYLENOL) 500 MG tablet Take 2 tablets (1,000 mg total) by mouth in the morning, at noon, and at bedtime.     Cholecalciferol (VITAMIN D3) 25 MCG (1000 UT) capsule Take 1 capsule (1,000 Units total) by mouth daily. 30 capsule    isosorbide mononitrate (IMDUR) 30 MG 24 hr tablet Take 30 mg by mouth daily.     linaclotide (LINZESS) 72 MCG capsule Take 1 capsule (72 mcg total) by mouth every other day. 45 capsule 1   RIVAROXABAN (  XARELTO) VTE STARTER PACK (15 & 20 MG) Follow package directions: Take one 15mg  tablet by mouth twice a day. On day 22, switch to one 20mg  tablet once a day. Take with food. 51 each 0   atenolol (TENORMIN) 50 MG tablet Take 1 tablet (50 mg total) by mouth daily. 90 tablet 3   fluticasone furoate-vilanterol (BREO ELLIPTA) 200-25 MCG/ACT AEPB Inhale 1 puff into the lungs daily. 28 each 3   atenolol  (TENORMIN) 50 MG tablet Take 0.5 tablets (25 mg total) by mouth daily.     azelastine (ASTELIN) 0.1 % nasal spray Place 1-2 sprays into both nostrils 2 (two) times daily. Use in each nostril as directed 30 mL 0   No facility-administered medications prior to visit.     Per HPI unless specifically indicated in ROS section below Review of Systems  Objective:  BP 138/82   Pulse 75   Temp 97.9 F (36.6 C) (Temporal)   Ht 5\' 4"  (1.626 m)   Wt 173 lb 2 oz (78.5 kg)   SpO2 95%   BMI 29.72 kg/m   Wt Readings from Last 3 Encounters:  12/14/22 173 lb 2 oz (78.5 kg)  12/07/22 175 lb (79.4 kg)  11/07/22 175 lb 4 oz (79.5 kg)      Physical Exam Vitals and nursing note reviewed.  Constitutional:      Appearance: Normal appearance. He is not ill-appearing.     Comments:  Ambulates with cane Using R knee brace   HENT:     Head: Normocephalic and atraumatic.     Right Ear: Ear canal and external ear normal. There is impacted cerumen.     Left Ear: Ear canal and external ear normal. There is impacted cerumen.     Nose: Rhinorrhea present. No congestion.     Right Turbinates: Pale. Not enlarged or swollen.     Left Turbinates: Pale. Not enlarged or swollen.     Mouth/Throat:     Mouth: Mucous membranes are moist.     Pharynx: Oropharynx is clear. No oropharyngeal exudate or posterior oropharyngeal erythema.  Eyes:     Extraocular Movements: Extraocular movements intact.     Conjunctiva/sclera: Conjunctivae normal.     Pupils: Pupils are equal, round, and reactive to light.  Cardiovascular:     Rate and Rhythm: Normal rate and regular rhythm.     Pulses: Normal pulses.     Heart sounds: Normal heart sounds. No murmur heard. Pulmonary:     Effort: Pulmonary effort is normal. No respiratory distress.     Breath sounds: Normal breath sounds. No wheezing, rhonchi or rales.  Musculoskeletal:     Cervical back: Normal range of motion and neck supple.  Skin:    General: Skin is warm and  dry.     Findings: Bruising present. No rash.     Comments: Bruising to left posterior ribcage  Neurological:     General: No focal deficit present.     Mental Status: He is alert.     Cranial Nerves: Cranial nerves 2-12 are intact.     Sensory: Sensation is intact.     Motor: Motor function is intact.     Comments:  CN 2-12 intact FTN intact EOMI Decreased hearing noted Unsteady on feet  Psychiatric:        Mood and Affect: Mood normal.        Behavior: Behavior normal.    Bilateral cerumen irrigation performed, without significant benefit.  Lab Results  Component Value Date   NA 139 12/08/2022   CL 104 12/08/2022   K 4.7 12/08/2022   CO2 26 12/08/2022   BUN 15 12/08/2022   CREATININE 0.87 12/08/2022   GFRNONAA >60 12/08/2022   CALCIUM 9.0 12/08/2022   PHOS 3.2 08/02/2022   ALBUMIN 4.1 10/06/2022   GLUCOSE 98 12/08/2022   Lab Results  Component Value Date   ALT 22 10/06/2022   AST 30 10/06/2022   ALKPHOS 73 10/06/2022   BILITOT 0.8 10/06/2022    Lab Results  Component Value Date   WBC 5.7 12/09/2022   HGB 14.5 12/09/2022   HCT 42.3 12/09/2022   MCV 91.2 12/09/2022   PLT 180 12/09/2022   Assessment & Plan:   Problem List Items Addressed This Visit     Benign prostatic hyperplasia    H/o this s/p TURP 2009.  Flomax may have caused dizziness when previously tried.       Chronic rhinitis    Chronic issue.  Did not tolerated intranasal steroid, oral antihistamines ineffective, did not tolerate atrovent nasal spray (tinnitus). Also states astelin caused tinnitus. Offered ENT eval - they will consider.       Primary osteoarthritis of right knee    Continues chronic knee brace. Not interested in surgical intervention.       Relevant Orders   Ambulatory referral to Home Health   General unsteadiness    Chronic issue.  Non-focal neurological exam. Recent head CT without acute process. Consider MRI vs neuro referral. Orthostasis could contribute  given symptoms predominantly present when standing. Consider dropping atenolol dose however hesitant given already borderline high readings. Will await cardiology evaluation.       Relevant Orders   Ambulatory referral to Home Health   History of deep vein thrombosis (DVT) of lower extremity   Relevant Orders   Ambulatory referral to Home Health   Recurrent falls    Agrees to HHPT eval for fall prevention       Relevant Orders   Ambulatory referral to Home Health   Pulmonary embolism and infarction [facility]) - Primary    Found to have bilateral pulmonary emboli, fortunately without R heart strain. Treated with iV heparin, discharged on Xarelto starting dose 15gm BID x3 wks then planned switch to 20mg  daily.  Likely needs lifelong anticoagulation, balancing this with fall risk.       Relevant Medications   atenolol (TENORMIN) 50 MG tablet   Other Relevant Orders   Ambulatory referral to Home Health   Hydronephrosis, left    Incidental finding on imaging. Continues urinating overall well.  H/o BPH s/p TURP 2009 [facility]) as well as bladder polyp removal.  Has upcoming urology appointment scheduled next week.         No orders of the defined types were placed in this encounter.   Orders Placed This Encounter  Procedures   Ambulatory referral to Home Health    Referral Priority:   Routine    Referral Type:   Home Health Care    Referral Reason:   Specialty Services Required    Requested Specialty:   Home Health Services    Number of Visits Requested:   1    Patient Instructions  Lo remitiremos a terapia fiscia en casa.  Siga Xarelto diario indefinitivamente.  Esperaremos evaluacion de Paediatric nurse.  Gusto verlo hoy.   Follow up plan: Return if symptoms worsen or fail to improve.  Eustaquio Boyden, MD

## 2022-12-16 ENCOUNTER — Encounter: Payer: Self-pay | Admitting: Family Medicine

## 2022-12-16 NOTE — Assessment & Plan Note (Signed)
Agrees to HHPT eval for fall prevention

## 2022-12-16 NOTE — Assessment & Plan Note (Signed)
Continues chronic knee brace. Not interested in surgical intervention.

## 2022-12-16 NOTE — Assessment & Plan Note (Signed)
Chronic issue.  Non-focal neurological exam. Recent head CT without acute process. Consider MRI vs neuro referral. Orthostasis could contribute given symptoms predominantly present when standing. Consider dropping atenolol dose however hesitant given already borderline high readings. Will await cardiology evaluation.

## 2022-12-16 NOTE — Assessment & Plan Note (Addendum)
Incidental finding on imaging. Continues urinating overall well.  H/o BPH s/p TURP 2009 [facility]) as well as bladder polyp removal.  Has upcoming urology appointment scheduled next week.

## 2022-12-16 NOTE — Assessment & Plan Note (Signed)
H/o this s/p TURP 2009.  Flomax may have caused dizziness when previously tried.

## 2022-12-16 NOTE — Assessment & Plan Note (Signed)
Found to have bilateral pulmonary emboli, fortunately without R heart strain. Treated with iV heparin, discharged on Xarelto starting dose 15gm BID x3 wks then planned switch to 20mg  daily.  Likely needs lifelong anticoagulation, balancing this with fall risk.

## 2022-12-16 NOTE — Assessment & Plan Note (Signed)
Chronic issue.  Did not tolerated intranasal steroid, oral antihistamines ineffective, did not tolerate atrovent nasal spray (tinnitus). Also states astelin caused tinnitus. Offered ENT eval - they will consider.

## 2022-12-19 ENCOUNTER — Telehealth: Payer: Self-pay | Admitting: Family Medicine

## 2022-12-19 NOTE — Telephone Encounter (Signed)
Trish returned call from [place], requested a call back when able

## 2022-12-19 NOTE — Telephone Encounter (Signed)
Agree with this. Thanks.  

## 2022-12-19 NOTE — Telephone Encounter (Signed)
Spoke with Trish, of [facility], informing her Dr. Reece Agar is giving verbal orders for requested services for pt.

## 2022-12-19 NOTE — Telephone Encounter (Signed)
Home Health verbal orders Caller Name: Rosann Auerbach  Agency Name: [facility]   Callback number: [phone]  Requesting OT/PT/Skilled nursing/Social Work/Speech: PT, OT eval   Reason:  Frequency: PT - once a week for one week, twice a week for two weeks, once a week for four week OT - eval   Please forward to [facility] pool or providers CMA

## 2022-12-19 NOTE — Telephone Encounter (Signed)
Lvm asking Rosann Auerbach, of [facility], to call back. Need to inform her Dr. Reece Agar is giving verbal orders for services requested.

## 2022-12-21 ENCOUNTER — Ambulatory Visit (INDEPENDENT_AMBULATORY_CARE_PROVIDER_SITE_OTHER): Payer: Medicare Other | Admitting: Urology

## 2022-12-21 ENCOUNTER — Encounter: Payer: Self-pay | Admitting: Urology

## 2022-12-21 VITALS — BP 150/84 | HR 97 | Wt 172.0 lb

## 2022-12-21 DIAGNOSIS — N135 Crossing vessel and stricture of ureter without hydronephrosis: Secondary | ICD-10-CM

## 2022-12-21 DIAGNOSIS — Z125 Encounter for screening for malignant neoplasm of prostate: Secondary | ICD-10-CM

## 2022-12-21 DIAGNOSIS — R3912 Poor urinary stream: Secondary | ICD-10-CM | POA: Diagnosis not present

## 2022-12-21 DIAGNOSIS — Z87438 Personal history of other diseases of male genital organs: Secondary | ICD-10-CM | POA: Diagnosis not present

## 2022-12-21 DIAGNOSIS — N131 Hydronephrosis with ureteral stricture, not elsewhere classified: Secondary | ICD-10-CM

## 2022-12-21 DIAGNOSIS — N401 Enlarged prostate with lower urinary tract symptoms: Secondary | ICD-10-CM

## 2022-12-21 LAB — MICROSCOPIC EXAMINATION

## 2022-12-21 LAB — URINALYSIS, COMPLETE
Bilirubin, UA: NEGATIVE
Glucose, UA: NEGATIVE
Ketones, UA: NEGATIVE
Nitrite, UA: NEGATIVE
Protein,UA: NEGATIVE
RBC, UA: NEGATIVE
Specific Gravity, UA: 1.015 (ref 1.005–1.030)
Urobilinogen, Ur: 0.2 mg/dL (ref 0.2–1.0)
pH, UA: 5.5 (ref 5.0–7.5)
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
## 2022-12-28 ENCOUNTER — Ambulatory Visit (INDEPENDENT_AMBULATORY_CARE_PROVIDER_SITE_OTHER): Payer: Medicare Other | Admitting: Family Medicine

## 2022-12-28 ENCOUNTER — Encounter: Payer: Self-pay | Admitting: Family Medicine

## 2022-12-28 VITALS — BP 142/84 | HR 74 | Temp 97.5°F | Wt 172.6 lb

## 2022-12-28 DIAGNOSIS — N401 Enlarged prostate with lower urinary tract symptoms: Secondary | ICD-10-CM | POA: Diagnosis not present

## 2022-12-28 DIAGNOSIS — R2681 Unsteadiness on feet: Secondary | ICD-10-CM

## 2022-12-28 DIAGNOSIS — R3912 Poor urinary stream: Secondary | ICD-10-CM

## 2022-12-28 DIAGNOSIS — R42 Dizziness and giddiness: Secondary | ICD-10-CM

## 2022-12-28 DIAGNOSIS — R109 Unspecified abdominal pain: Secondary | ICD-10-CM | POA: Diagnosis not present

## 2022-12-28 DIAGNOSIS — N1330 Unspecified hydronephrosis: Secondary | ICD-10-CM

## 2022-12-28 DIAGNOSIS — I2699 Other pulmonary embolism without acute cor pulmonale: Secondary | ICD-10-CM

## 2022-12-28 DIAGNOSIS — N1831 Chronic kidney disease, stage 3a: Secondary | ICD-10-CM

## 2022-12-28 NOTE — Progress Notes (Unsigned)
Ph: [phone] Fax: [phone]   Patient ID: Joseph Osborn, male    DOB: Sep 10, 1925, 89 y.o.   MRN: [id number]  This visit was conducted in person.  BP (!) 142/84   Pulse 74   Temp (!) 97.5 F (36.4 C) (Temporal)   Wt 172 lb 9.6 oz (78.3 kg)   SpO2 94%   BMI 29.63 kg/m    CC: ongoing L sided pain  Subjective:   HPI: Joseph Osborn is a 89 y.o. male presenting on 12/28/2022 for Pain (Still having pain on left side )   See prior note for details. Hospitalization 12/07/2022 for fall with dizziness, found to have bilateral PE with pulm infarct, started on xarelto. Had L flank pain and evidence of hematomas after fall. No fractures noted on imaging studies.   Notes ongoing left flank pain, managing with tylenol 500mg  once daily. He's also been using OTC topical cream which is more effective than lidocaine patches.  Has been sleeping on right side - now developing R shoulder and arm pain.   Saw urology for incidentally noted chronic L UVJ obstruction - not thought related to flank pain after fall. Planning conservative management for this with rpt imaging 3 months.   Referred to home health - [facility] has started, OT pending.   Saw cardiology - atenolol and isosorbide stopped.  Dizziness has improved after this.      Relevant past medical, surgical, family and social history reviewed and updated as indicated. Interim medical history since our last visit reviewed. Allergies and medications reviewed and updated. Outpatient Medications Prior to Visit  Medication Sig Dispense Refill   acetaminophen (TYLENOL) 500 MG tablet Take 2 tablets (1,000 mg total) by mouth in the morning, at noon, and at bedtime.     Cholecalciferol (VITAMIN D3) 25 MCG (1000 UT) capsule Take 1 capsule (1,000 Units total) by mouth daily. 30 capsule    linaclotide (LINZESS) 72 MCG capsule Take 1 capsule (72 mcg total) by mouth every other day. 45 capsule 1   RIVAROXABAN (XARELTO) VTE  STARTER PACK (15 & 20 MG) Follow package directions: Take one 15mg  tablet by mouth twice a day. On day 22, switch to one 20mg  tablet once a day. Take with food. 51 each 0   No facility-administered medications prior to visit.     Per HPI unless specifically indicated in ROS section below Review of Systems  Objective:  BP (!) 142/84   Pulse 74   Temp (!) 97.5 F (36.4 C) (Temporal)   Wt 172 lb 9.6 oz (78.3 kg)   SpO2 94%   BMI 29.63 kg/m   Wt Readings from Last 3 Encounters:  12/28/22 172 lb 9.6 oz (78.3 kg)  12/21/22 172 lb (78 kg)  12/14/22 173 lb 2 oz (78.5 kg)      Physical Exam Vitals and nursing note reviewed.  Constitutional:      Appearance: Normal appearance. He is not ill-appearing.  HENT:     Head: Normocephalic and atraumatic.  Eyes:     Extraocular Movements: Extraocular movements intact.     Conjunctiva/sclera: Conjunctivae normal.     Pupils: Pupils are equal, round, and reactive to light.  Cardiovascular:     Rate and Rhythm: Normal rate and regular rhythm.     Pulses: Normal pulses.     Heart sounds: Normal heart sounds. No murmur heard. Pulmonary:     Effort: Pulmonary effort is normal. No respiratory distress.  Breath sounds: Normal breath sounds. No wheezing, rhonchi or rales.       Comments: Tender area, bruising has resolved Chest:     Chest wall: Tenderness present.  Abdominal:     General: Bowel sounds are normal. There is no distension.     Palpations: Abdomen is soft. There is no mass.     Tenderness: There is no abdominal tenderness. There is left CVA tenderness. There is no right CVA tenderness, guarding or rebound.     Hernia: No hernia is present.  Musculoskeletal:     Right lower leg: No edema.     Left lower leg: No edema.     Comments: Wearing R knee brace  Neurological:     Mental Status: He is alert.  Psychiatric:        Mood and Affect: Mood normal.        Behavior: Behavior normal.       Results for orders placed or  performed in visit on 12/21/22  Microscopic Examination   Urine  Result Value Ref Range   WBC, UA 11-30 (A) 0 - 5 /hpf   RBC, Urine 0-2 0 - 2 /hpf   Epithelial Cells (non renal) 0-10 0 - 10 /hpf   Mucus, UA Present (A) Not Estab.   Bacteria, UA Moderate (A) None seen/Few  Urinalysis, Complete  Result Value Ref Range   Specific Gravity, UA 1.015 1.005 - 1.030   pH, UA 5.5 5.0 - 7.5   Color, UA Yellow Yellow   Appearance Ur Hazy (A) Clear   Leukocytes,UA Trace (A) Negative   Protein,UA Negative Negative/Trace   Glucose, UA Negative Negative   Ketones, UA Negative Negative   RBC, UA Negative Negative   Bilirubin, UA Negative Negative   Urobilinogen, Ur 0.2 0.2 - 1.0 mg/dL   Nitrite, UA Negative Negative   Microscopic Examination See below:    Lab Results  Component Value Date   NA 139 12/08/2022   CL 104 12/08/2022   K 4.7 12/08/2022   CO2 26 12/08/2022   BUN 15 12/08/2022   CREATININE 0.87 12/08/2022   GFRNONAA >60 12/08/2022   CALCIUM 9.0 12/08/2022   PHOS 3.2 08/02/2022   ALBUMIN 4.1 10/06/2022   GLUCOSE 98 12/08/2022    Assessment & Plan:   Problem List Items Addressed This Visit   None    No orders of the defined types were placed in this encounter.   No orders of the defined types were placed in this encounter.   Patient Instructions  Tome tylenol 500mg  todas las noches por las proximas 1-2 semanas.  Puede usar crema como icy hot o aspercream para dolor de espalda.  Puede usar almohada caliente para dolor de espalda.   Follow up plan: Return if symptoms worsen or fail to improve.  Eustaquio Boyden, MD

## 2022-12-28 NOTE — Patient Instructions (Addendum)
Tome tylenol 500mg  todas las noches por las proximas 1-2 semanas.  Puede usar crema como icy hot o aspercream para dolor de espalda.  Puede usar almohada caliente para dolor de espalda.

## 2022-12-29 ENCOUNTER — Other Ambulatory Visit: Payer: Self-pay

## 2022-12-29 ENCOUNTER — Emergency Department
Admission: EM | Admit: 2022-12-29 | Discharge: 2022-12-29 | Disposition: A | Discharge status: Home / Self Care | Payer: Medicare Other | Attending: Emergency Medicine | Admitting: Emergency Medicine

## 2022-12-29 ENCOUNTER — Emergency Department: Payer: Medicare Other

## 2022-12-29 DIAGNOSIS — Z7901 Long term (current) use of anticoagulants: Secondary | ICD-10-CM | POA: Insufficient documentation

## 2022-12-29 DIAGNOSIS — J449 Chronic obstructive pulmonary disease, unspecified: Secondary | ICD-10-CM | POA: Insufficient documentation

## 2022-12-29 DIAGNOSIS — R109 Unspecified abdominal pain: Secondary | ICD-10-CM | POA: Insufficient documentation

## 2022-12-29 DIAGNOSIS — N183 Chronic kidney disease, stage 3 unspecified: Secondary | ICD-10-CM | POA: Insufficient documentation

## 2022-12-29 DIAGNOSIS — K625 Hemorrhage of anus and rectum: Secondary | ICD-10-CM | POA: Insufficient documentation

## 2022-12-29 DIAGNOSIS — K648 Other hemorrhoids: Secondary | ICD-10-CM | POA: Insufficient documentation

## 2022-12-29 LAB — CBC
HCT: 46.5 % (ref 39.0–52.0)
Hemoglobin: 15.2 g/dL (ref 13.0–17.0)
MCH: 30.6 pg (ref 26.0–34.0)
MCHC: 32.7 g/dL (ref 30.0–36.0)
MCV: 93.8 fL (ref 80.0–100.0)
Platelets: 240 10*3/uL (ref 150–400)
RBC: 4.96 MIL/uL (ref 4.22–5.81)
RDW: 13.1 % (ref 11.5–15.5)
WBC: 4.6 10*3/uL (ref 4.0–10.5)
nRBC: 0 % (ref 0.0–0.2)

## 2022-12-29 LAB — COMPREHENSIVE METABOLIC PANEL
ALT: 29 U/L (ref 0–44)
AST: 32 U/L (ref 15–41)
Albumin: 4.3 g/dL (ref 3.5–5.0)
Alkaline Phosphatase: 109 U/L (ref 38–126)
Anion gap: 10 (ref 5–15)
BUN: 17 mg/dL (ref 8–23)
CO2: 25 mmol/L (ref 22–32)
Calcium: 9.4 mg/dL (ref 8.9–10.3)
Chloride: 103 mmol/L (ref 98–111)
Creatinine, Ser: 1 mg/dL (ref 0.61–1.24)
GFR, Estimated: >60 mL/min (ref >60)
Glucose, Bld: 96 mg/dL (ref 70–99)
Potassium: 4.2 mmol/L (ref 3.5–5.1)
Sodium: 138 mmol/L (ref 135–145)
Total Bilirubin: 0.8 mg/dL (ref 0.3–1.2)
Total Protein: 8.1 g/dL (ref 6.5–8.1)

## 2022-12-29 LAB — TYPE AND SCREEN
ABO/RH(D): O POS
Antibody Screen: NEGATIVE

## 2022-12-29 LAB — LIPASE, BLOOD: Lipase: 32 U/L (ref 11–51)

## 2022-12-29 MED ORDER — RIVAROXABAN 20 MG PO TABS: 20.0000 mg | ORAL_TABLET | Freq: Every day | ORAL | 11 refills | Status: DC

## 2022-12-29 MED ORDER — IOHEXOL 350 MG/ML SOLN
100.0000 mL | Freq: Once | INTRAVENOUS | Status: AC | PRN
  Administered 2022-12-29: 100 mL via INTRAVENOUS

## 2022-12-29 NOTE — Assessment & Plan Note (Addendum)
Now on xarelto 20mg  daily.  Will need lifelong anticoagulation given 2nd blood clot.  Will refill.

## 2022-12-29 NOTE — Assessment & Plan Note (Addendum)
Ongoing left flank pain at site of injury during prior fall - predominant pain localizes to left posterior lower ribcage. Incidentally noted chronic appearing L hydroureter and hydronephrosis, has seen uro, deemed not related to pain.  He does note discomfort is improving daily.  Discussed tylenol use. Avoiding muscle relaxant and stronger pain med (opiate) in fall risk, avoiding NSAIDs in CKD and now anticoagulated.  Discussed imaging to eval hematoma/bleed as he's now on chronic anticoagulation, however given daily improvement noted will defer further eval at this time.

## 2022-12-29 NOTE — ED Provider Notes (Signed)
[facility] Provider Note    Event Date/Time   First MD Initiated Contact with Patient 12/29/22 1327     (approximate)   History   Rectal Bleeding   HPI KEAGEN ADRAGNA [name] is a 89 y.o. male CKD stage III, COPD, history of DVT and PE on Xarelto who presents today for rectal bleeding.  Patient reports with daughter and they state that patient had an episode where he was on the toilet earlier today and looked down after having a bowel movement and noticed bright red blood in the toilet.  He then was in the shower and noticed blood streaming down his leg which she described as significant.  No recent history of hemorrhoids but did have them back 30-40 years ago.  He has vague lower abdominal pressure symptoms.  Otherwise denies fever, lightheadedness, chest pain, shortness of breath, nausea, vomiting, diarrhea, constipation.  No missed doses of his Xarelto recently.  In person Spanish interpreter was used for this visit     Physical Exam   Triage Vital Signs: ED Triage Vitals [12/29/22 1259]  Encounter Vitals Group     BP (!) 147/86     Systolic BP Percentile      Diastolic BP Percentile      Pulse Rate 92     Resp 18     Temp 98.1 F (36.7 C)     Temp Source Oral     SpO2 94 %     Weight      Height      Head Circumference      Peak Flow      Pain Score 0     Pain Loc      Pain Education      Exclude from Growth Chart     Most recent vital signs: Vitals:   12/29/22 1259  BP: (!) 147/86  Pulse: 92  Resp: 18  Temp: 98.1 F (36.7 C)  SpO2: 94%   Physical Exam: I have reviewed the vital signs and nursing notes. General: Awake, alert, no acute distress.  Nontoxic appearing. Head:  Atraumatic, normocephalic.   ENT:  EOM intact, PERRL. Oral mucosa is pink and moist with no lesions. Neck: Neck is supple with full range of motion, No meningeal signs. Cardiovascular:  RRR, No murmurs. Peripheral pulses palpable and equal  bilaterally. Respiratory:  Symmetrical chest wall expansion.  No rhonchi, rales, or wheezes.  Good air movement throughout.  No use of accessory muscles.   Musculoskeletal:  No cyanosis or edema. Moving extremities with full ROM Abdomen:  Soft, minimal tenderness to palpation/pressure in lower abdominal region, nondistended.  Anal exam shows no external hemorrhoids but can palpate internal hemorrhoid.  No evidence of active bleeding at this time. Neuro:  GCS 15, moving all four extremities, interacting appropriately. Speech clear. Psych:  Calm, appropriate.   Skin:  Warm, dry, no rash.     ED Results / Procedures / Treatments   Labs (all labs ordered are listed, but only abnormal results are displayed) Labs Reviewed  COMPREHENSIVE METABOLIC PANEL  CBC  LIPASE, BLOOD  POC OCCULT BLOOD, ED  TYPE AND SCREEN     EKG    RADIOLOGY CTA abdomen/pelvis pending at time of signout   PROCEDURES:  Critical Care performed: No  Procedures   MEDICATIONS ORDERED IN ED: Medications  iohexol (OMNIPAQUE) 350 MG/ML injection 100 mL (100 mLs Intravenous Contrast Given 12/29/22 1513)     IMPRESSION / MDM / ASSESSMENT AND  PLAN / ED COURSE  I reviewed the triage vital signs and the nursing notes.                              Differential diagnosis includes, but is not limited to, external hemorrhoid, internal hemorrhoid, anal fissure, diverticular bleed, anastomotic bleed.  Patient's presentation is most consistent with acute presentation with potential threat to life or bodily function.  Patient is a 89 year old male presenting today with acute bright red blood per rectum.  No evidence of anemia and other laboratory workup otherwise unremarkable at this time.  Anal exam does show concern for possible internal hemorrhoid but difficult to visualize it entirely.  Given patient's mild lower abdominal pain symptoms as well as being on a blood thinner, we will get a CTA abdomen/pelvis to rule  out any signs of active GI bleed.  Patient was signed out to Dr. Marisa Severin while awaiting results of CT scan.  If this is normal, patient can likely be discharged with outpatient follow-up.  The patient is on the cardiac monitor to evaluate for evidence of arrhythmia and/or significant heart rate changes. Clinical Course as of 12/29/22 1531  Thu Dec 29, 2022  1351 Lipase: 32 [DW]  1351 CBC No acute anemia [DW]  1351 Comprehensive metabolic panel unremarkable [DW]    Clinical Course User Index [DW] Janith Lima, MD     FINAL CLINICAL IMPRESSION(S) / ED DIAGNOSES   Final diagnoses:  Rectal bleeding  Internal hemorrhoid     Rx / DC Orders   ED Discharge Orders     None        Note:  This document was prepared using Dragon voice recognition software and may include unintentional dictation errors.   Janith Lima, MD 12/29/22 [phone]

## 2022-12-29 NOTE — Assessment & Plan Note (Signed)
Latest GFR >60. Will continue to monitor.

## 2022-12-29 NOTE — Assessment & Plan Note (Addendum)
Atenolol, imdur stopped by cardiology - will reassess dizziness off antihypertensives.

## 2022-12-29 NOTE — ED Notes (Signed)
Pt ambulated to the bedside commode with a steady gait. Pt back to bed. Denies further needs.

## 2022-12-29 NOTE — Assessment & Plan Note (Signed)
Has established with [facility] PT, OT pending.

## 2022-12-29 NOTE — ED Provider Notes (Signed)
-----------------------------------------   5:12 PM on 12/29/2022 -----------------------------------------  I took over care of this patient from Dr. Anner Crete.  CTA shows no evidence of acute hemorrhage.  IMPRESSION:  VASCULAR    1. No evidence for active gastrointestinal bleeding.    Aortic Atherosclerosis (ICD10-I70.0).    NON-VASCULAR    1. No acute localizing process in the abdomen or pelvis.  2. Colonic diverticulosis.  3. Stable moderate left-sided hydroureteronephrosis with abrupt  caliber change in the distal left ureter. Findings may be related to  stricture or neoplasm.  4. Stable prostatomegaly.   The patient has not any further bleeding.  I counseled him and his family member on the results of the workup.  I did consider whether this patient may benefit from inpatient admission given his age, and the fact that he is on Xarelto.  However given the negative CTA, resolved symptoms, and normal hemoglobin, I think that discharge is reasonable.  The patient feels well and would like to go home.  Family members feels comfortable with this plan.  I gave strict return precautions and they expressed understanding.  We did discuss risks and benefits of holding Xarelto, however they feel strongly about continuing given his recent PE.  They understand to return if he has any recurrent or worsening bleeding.  I have given GI referral and they will follow-up with the PMD as well.   Dionne Bucy, MD 12/29/22 [phone]

## 2022-12-29 NOTE — Assessment & Plan Note (Signed)
Established with urology, planning to repeat imaging in 3 months.

## 2022-12-29 NOTE — ED Triage Notes (Signed)
Pt to ED via POV from home. Pt accompanied by daughter. Daughter states this morning pt used the bathroom and filled the toilet bowl with bright red blood. Daughter states pt was then in the showing and started bleeding from rectum again. Pt is on Xarelto from PE

## 2022-12-29 NOTE — Assessment & Plan Note (Addendum)
Chronic issue, s/p TURP 2009, however imaging shows enlarged prostate again. Has established with urology, planned close monitoring with rpt imaging in 3 months. He notes he's overall voiding well.

## 2022-12-30 ENCOUNTER — Telehealth: Payer: Self-pay | Admitting: Family Medicine

## 2022-12-30 ENCOUNTER — Telehealth: Payer: Self-pay

## 2022-12-30 NOTE — Telephone Encounter (Signed)
Called and spoke to daughter (on dpr)  States today patient is having improvement on symptoms. He did have some bleeding when he passed gas last night but was very small amount. No bleeding today.   Advised daughter that I will check and see how you want to move forward with the small amount of bleeding last night. She has not given any at all today.

## 2022-12-30 NOTE — Telephone Encounter (Signed)
Patient was seen in ED on yesterday 12/29/2022.He was told to stop rivaroxaban (XARELTO) 20 MG TABS tablet ,daughter would like to know if she can give him one today?Please advise.

## 2022-12-30 NOTE — Telephone Encounter (Signed)
Duplicate see other open message for documentation.

## 2022-12-30 NOTE — Telephone Encounter (Signed)
Sending note to Dr Esau Grew pool and will speak with CMA working with Dr Reece Agar today.

## 2022-12-30 NOTE — Telephone Encounter (Signed)
Called patient reviewed all information and repeated back to me. Will call if any questions.  He is having issues with constipation and thinks that is what started the bleeding. Reviewed with Dr. Sharen Hones and advised to take on cap full of Murelax daily and let us know if no improvement.

## 2022-12-30 NOTE — Telephone Encounter (Signed)
Seen at ER with rectal bleed.  Was recommended hold xarelto 15mg  overnight.  Please call for update on symptoms.  How many more days of Xarelto 15mg  BID dose do they have before starting 20mg  once daily? If bleeding has stopped, may recommend transitioning to 20mg  once daily dosing (overall lower total daily dose).

## 2022-12-30 NOTE — Telephone Encounter (Addendum)
Since bleeding is better, let's start xarelto 20mg  starting tonight, take nightly. New Rx should be ready at pharmacy.  Stop and let us know if bleeding returns.

## 2022-12-30 NOTE — Addendum Note (Signed)
Addended by: Eustaquio Boyden on: 12/30/2022 02:11 PM   Modules accepted: Orders

## 2023-01-03 ENCOUNTER — Telehealth: Payer: Self-pay | Admitting: Family Medicine

## 2023-01-03 NOTE — Telephone Encounter (Signed)
error 

## 2023-01-03 NOTE — Telephone Encounter (Signed)
Patient dropped off document Home Health Certificate (Order ID  ), to be filled out by provider. Patient requested to send it back via Fax within 5-days. Document is located in providers tray at front office.Please advise at Mobile [phone] (mobile)   Home health came through fax. I put in the dr box

## 2023-01-04 ENCOUNTER — Ambulatory Visit: Payer: Medicare Other | Admitting: Family Medicine

## 2023-01-04 ENCOUNTER — Encounter: Payer: Self-pay | Admitting: Family Medicine

## 2023-01-04 VITALS — BP 140/78 | HR 88 | Temp 97.8°F | Wt 171.6 lb

## 2023-01-04 DIAGNOSIS — K625 Hemorrhage of anus and rectum: Secondary | ICD-10-CM

## 2023-01-04 DIAGNOSIS — R109 Unspecified abdominal pain: Secondary | ICD-10-CM

## 2023-01-04 DIAGNOSIS — Z23 Encounter for immunization: Secondary | ICD-10-CM

## 2023-01-04 DIAGNOSIS — N1831 Chronic kidney disease, stage 3a: Secondary | ICD-10-CM | POA: Diagnosis not present

## 2023-01-04 DIAGNOSIS — K5909 Other constipation: Secondary | ICD-10-CM

## 2023-01-04 DIAGNOSIS — I2699 Other pulmonary embolism without acute cor pulmonale: Secondary | ICD-10-CM

## 2023-01-04 MED ORDER — POLYETHYLENE GLYCOL 3350 17 GM/SCOOP PO POWD: 17.0000 g | Freq: Every day | ORAL | Status: DC | PRN

## 2023-01-04 MED ORDER — LINACLOTIDE 72 MCG PO CAPS: 72.0000 ug | ORAL_CAPSULE | Freq: Every day | ORAL | 3 refills | Status: DC

## 2023-01-04 NOTE — Patient Instructions (Signed)
Puede seguir linzess diario, puede usar miralax tambien regularmente como necesite para evitar estrenimiento.  Tome mucha agua, fibra en la dieta.  Siga xarelto 20mg  diarios.  Si vuelve a Dispensing optician, lo mandaremos a [employer].

## 2023-01-04 NOTE — Progress Notes (Signed)
Ph: [phone] Fax: [phone]   Patient ID: Joseph Osborn, male    DOB: [birth year], 89 y.o.   MRN: [id number]  This visit was conducted in person.  BP (!) 140/78   Pulse 88   Temp 97.8 F (36.6 C) (Temporal)   Wt 171 lb 9.6 oz (77.8 kg)   SpO2 95%   BMI 29.46 kg/m    CC: ER f/u visit  Subjective:   HPI: Joseph Osborn is a 89 y.o. male presenting on 01/04/2023 for Follow-up [facility] follow up)   See prior note for details.  After seen in office last week, developed episode of rectal bleeding (BRBPR) while taking a shower on 12/29/2022 associated with constipation. Presented to ER, records reviewed. Labs reassuring including CBC< CMP, lipase. CTA abd/pelvis without active bleed. Stable mod hydroureteronephrosis to proximal left ureter. Was completing xarelto 15mg  bid loading dose, this was held.   Some persistent constipation - this is despite one 1 packet of miralax daily. He took Linzess 1 tablet today with benefit.   Also notes persistent left flank discomfort - planned urological f/u in 3 months.      Relevant past medical, surgical, family and social history reviewed and updated as indicated. Interim medical history since our last visit reviewed. Allergies and medications reviewed and updated. Outpatient Medications Prior to Visit  Medication Sig Dispense Refill   acetaminophen (TYLENOL) 500 MG tablet Take 2 tablets (1,000 mg total) by mouth in the morning, at noon, and at bedtime.     Cholecalciferol (VITAMIN D3) 25 MCG (1000 UT) capsule Take 1 capsule (1,000 Units total) by mouth daily. 30 capsule    rivaroxaban (XARELTO) 20 MG TABS tablet Take 1 tablet (20 mg total) by mouth daily with supper. 30 tablet 11   linaclotide (LINZESS) 72 MCG capsule Take 1 capsule (72 mcg total) by mouth every other day. 45 capsule 1   No facility-administered medications prior to visit.     Per HPI unless specifically indicated in ROS section below Review of  Systems  Objective:  BP (!) 140/78   Pulse 88   Temp 97.8 F (36.6 C) (Temporal)   Wt 171 lb 9.6 oz (77.8 kg)   SpO2 95%   BMI 29.46 kg/m   Wt Readings from Last 3 Encounters:  01/04/23 171 lb 9.6 oz (77.8 kg)  12/28/22 172 lb 9.6 oz (78.3 kg)  12/21/22 172 lb (78 kg)      Physical Exam Vitals and nursing note reviewed.  Constitutional:      Appearance: Normal appearance. He is not ill-appearing.  HENT:     Head: Normocephalic and atraumatic.     Mouth/Throat:     Mouth: Mucous membranes are moist.     Pharynx: Oropharynx is clear. No oropharyngeal exudate or posterior oropharyngeal erythema.  Eyes:     Conjunctiva/sclera: Conjunctivae normal.     Pupils: Pupils are equal, round, and reactive to light.  Cardiovascular:     Rate and Rhythm: Normal rate and regular rhythm.     Pulses: Normal pulses.     Heart sounds: Normal heart sounds. No murmur heard. Pulmonary:     Effort: Pulmonary effort is normal. No respiratory distress.     Breath sounds: Normal breath sounds. No wheezing, rhonchi or rales.       Comments: Persistent discomfort to palpation at left lateral posterior ribcage Musculoskeletal:     Right lower leg: No edema.     Left lower leg: No  edema.  Skin:    General: Skin is warm and dry.     Findings: No bruising or rash.     Comments: No further flank bruising or ecchymosis  Neurological:     Mental Status: He is alert.  Psychiatric:        Mood and Affect: Mood normal.        Behavior: Behavior normal.       Results for orders placed or performed during the hospital encounter of 12/29/22  Comprehensive metabolic panel  Result Value Ref Range   Sodium 138 135 - 145 mmol/L   Potassium 4.2 3.5 - 5.1 mmol/L   Chloride 103 98 - 111 mmol/L   CO2 25 22 - 32 mmol/L   Glucose, Bld 96 70 - 99 mg/dL   BUN 17 8 - 23 mg/dL   Creatinine, Ser 1.61 0.61 - 1.24 mg/dL   Calcium 9.4 8.9 - 09.6 mg/dL   Total Protein 8.1 6.5 - 8.1 g/dL   Albumin 4.3 3.5 - 5.0  g/dL   AST 32 15 - 41 U/L   ALT 29 0 - 44 U/L   Alkaline Phosphatase 109 38 - 126 U/L   Total Bilirubin 0.8 0.3 - 1.2 mg/dL   GFR, Estimated >04 >54 mL/min   Anion gap 10 5 - 15  CBC  Result Value Ref Range   WBC 4.6 4.0 - 10.5 K/uL   RBC 4.96 4.22 - 5.81 MIL/uL   Hemoglobin 15.2 13.0 - 17.0 g/dL   HCT 09.8 11.9 - 14.7 %   MCV 93.8 80.0 - 100.0 fL   MCH 30.6 26.0 - 34.0 pg   MCHC 32.7 30.0 - 36.0 g/dL   RDW 82.9 56.2 - 13.0 %   Platelets 240 150 - 400 K/uL   nRBC 0.0 0.0 - 0.2 %  Lipase, blood  Result Value Ref Range   Lipase 32 11 - 51 U/L  Type and screen [facility]  Result Value Ref Range   ABO/RH(D) O POS    Antibody Screen NEG    Sample Expiration      01/01/2023,2359 Performed at [facility], [address] [place], [place] [address]     Assessment & Plan:   Problem List Items Addressed This Visit     CKD (chronic kidney disease), stage III (HCC)    Latest Cr 1.0, GFR >60. Will continue to monitor.       Chronic constipation    Discussed bowel regimen - will increase linzess to daily dosing, discussed PRN miralax use with milk of magnesium for breakthrough constipation. Discussed water/fiber intake.       Relevant Medications   polyethylene glycol powder (GLYCOLAX/MIRALAX) 17 GM/SCOOP powder   Pulmonary embolism and infarction (HCC)    Continue indefinite xarelto 20mg , monitoring for recurrent rectal bleed. Discussed switch to eliquis - but pt prefers to stay with once daily xarelto dosing.       Left flank pain    Ongoing flank discomfort after fall. This is also side of hydroureteronephrosis has urology f/u planned 03/2023.       BRBPR (bright red blood per rectum) - Primary    Recent isolated episode of rectal bleed, CTA abd/pelvis non-localizing. Presumed diverticular vs int hemorrhoidal source, in setting of xraelto 15mg  BID use. This was held for 24 hours, and lower 20mg  xarelto daily dose was restarted.  Labs  reassuring. No recurrent bleed since ER visit.  ER had recommended GI referral however given  bleeding has stopped, they would like to defer GI f/u at this time. Given age/comorbidities, reasonable to monitor for recurrence and if rpt bleed develops, will refer to GI. If needed, they would like to go to [facility].       Other Visit Diagnoses     Need for influenza vaccination       Relevant Orders   Flu Vaccine Trivalent High Dose (Fluad) (Completed)        Meds ordered this encounter  Medications   linaclotide (LINZESS) 72 MCG capsule    Sig: Take 1 capsule (72 mcg total) by mouth daily before breakfast.    Dispense:  90 capsule    Refill:  3    Note new sig   polyethylene glycol powder (GLYCOLAX/MIRALAX) 17 GM/SCOOP powder    Sig: Take 17 g by mouth daily as needed for moderate constipation.    Orders Placed This Encounter  Procedures   Flu Vaccine Trivalent High Dose (Fluad)    Patient Instructions  Puede seguir linzess diario, puede usar miralax tambien regularmente como necesite para evitar estrenimiento.  Tome mucha agua, fibra en la dieta.  Siga xarelto 20mg  diarios.  Si vuelve a Dispensing optician, lo mandaremos a [employer].   Follow up plan: Return if symptoms worsen or fail to improve.  Eustaquio Boyden, MD

## 2023-01-06 ENCOUNTER — Telehealth: Payer: Self-pay | Admitting: Family Medicine

## 2023-01-06 ENCOUNTER — Encounter: Payer: Self-pay | Admitting: Family Medicine

## 2023-01-06 DIAGNOSIS — K625 Hemorrhage of anus and rectum: Secondary | ICD-10-CM | POA: Insufficient documentation

## 2023-01-06 NOTE — Assessment & Plan Note (Addendum)
Ongoing flank discomfort after fall. This is also side of hydroureteronephrosis has urology f/u planned 03/2023.

## 2023-01-06 NOTE — Assessment & Plan Note (Addendum)
Recent isolated episode of rectal bleed, CTA abd/pelvis non-localizing. Presumed diverticular vs int hemorrhoidal source, in setting of xraelto 15mg  BID use. This was held for 24 hours, and lower 20mg  xarelto daily dose was restarted.  Labs reassuring. No recurrent bleed since ER visit.  ER had recommended GI referral however given bleeding has stopped, they would like to defer GI f/u at this time. Given age/comorbidities, reasonable to monitor for recurrence and if rpt bleed develops, will refer to GI. If needed, they would like to go to [facility].

## 2023-01-06 NOTE — Assessment & Plan Note (Addendum)
Discussed bowel regimen - will increase linzess to daily dosing, discussed PRN miralax use with milk of magnesium for breakthrough constipation. Discussed water/fiber intake.

## 2023-01-06 NOTE — Telephone Encounter (Signed)
FYI: This call has been transferred to Access Nurse. Once the result note has been entered staff can address the message at that time.  Patient called in with the following symptoms:  Red Word: bodyaches,chills after flu shot,fluctuating b/p and pulse    Please advise at Mobile [phone] (mobile)  Message is routed to Provider Pool and [facility] Triage

## 2023-01-06 NOTE — Assessment & Plan Note (Signed)
Latest Cr 1.0, GFR >60. Will continue to monitor.

## 2023-01-06 NOTE — Assessment & Plan Note (Addendum)
Continue indefinite xarelto 20mg , monitoring for recurrent rectal bleed. Discussed switch to eliquis - but pt prefers to stay with once daily xarelto dosing.

## 2023-01-07 ENCOUNTER — Other Ambulatory Visit: Payer: Self-pay

## 2023-01-07 ENCOUNTER — Inpatient Hospital Stay
Admission: EM | Admit: 2023-01-07 | Discharge: 2023-01-09 | DRG: 177 | Disposition: A | Discharge status: Home Health | Payer: Medicare Other | Attending: Internal Medicine | Admitting: Internal Medicine

## 2023-01-07 ENCOUNTER — Emergency Department: Payer: Medicare Other

## 2023-01-07 DIAGNOSIS — Z888 Allergy status to other drugs, medicaments and biological substances status: Secondary | ICD-10-CM

## 2023-01-07 DIAGNOSIS — J441 Chronic obstructive pulmonary disease with (acute) exacerbation: Secondary | ICD-10-CM | POA: Diagnosis not present

## 2023-01-07 DIAGNOSIS — R0682 Tachypnea, not elsewhere classified: Secondary | ICD-10-CM | POA: Diagnosis present

## 2023-01-07 DIAGNOSIS — Z961 Presence of intraocular lens: Secondary | ICD-10-CM | POA: Diagnosis present

## 2023-01-07 DIAGNOSIS — E871 Hypo-osmolality and hyponatremia: Secondary | ICD-10-CM | POA: Diagnosis present

## 2023-01-07 DIAGNOSIS — Z955 Presence of coronary angioplasty implant and graft: Secondary | ICD-10-CM

## 2023-01-07 DIAGNOSIS — J440 Chronic obstructive pulmonary disease with acute lower respiratory infection: Secondary | ICD-10-CM | POA: Diagnosis present

## 2023-01-07 DIAGNOSIS — Z79899 Other long term (current) drug therapy: Secondary | ICD-10-CM

## 2023-01-07 DIAGNOSIS — Z66 Do not resuscitate: Secondary | ICD-10-CM | POA: Diagnosis present

## 2023-01-07 DIAGNOSIS — Z86711 Personal history of pulmonary embolism: Secondary | ICD-10-CM

## 2023-01-07 DIAGNOSIS — N1831 Chronic kidney disease, stage 3a: Secondary | ICD-10-CM | POA: Diagnosis present

## 2023-01-07 DIAGNOSIS — I2699 Other pulmonary embolism without acute cor pulmonale: Secondary | ICD-10-CM | POA: Diagnosis not present

## 2023-01-07 DIAGNOSIS — Z8249 Family history of ischemic heart disease and other diseases of the circulatory system: Secondary | ICD-10-CM

## 2023-01-07 DIAGNOSIS — U071 COVID-19: Secondary | ICD-10-CM

## 2023-01-07 DIAGNOSIS — Z9079 Acquired absence of other genital organ(s): Secondary | ICD-10-CM

## 2023-01-07 DIAGNOSIS — I2510 Atherosclerotic heart disease of native coronary artery without angina pectoris: Secondary | ICD-10-CM | POA: Diagnosis present

## 2023-01-07 DIAGNOSIS — N400 Enlarged prostate without lower urinary tract symptoms: Secondary | ICD-10-CM | POA: Diagnosis present

## 2023-01-07 DIAGNOSIS — I872 Venous insufficiency (chronic) (peripheral): Secondary | ICD-10-CM | POA: Diagnosis present

## 2023-01-07 DIAGNOSIS — Z9049 Acquired absence of other specified parts of digestive tract: Secondary | ICD-10-CM

## 2023-01-07 DIAGNOSIS — J1282 Pneumonia due to coronavirus disease 2019: Secondary | ICD-10-CM | POA: Diagnosis present

## 2023-01-07 DIAGNOSIS — N183 Chronic kidney disease, stage 3 unspecified: Secondary | ICD-10-CM | POA: Diagnosis present

## 2023-01-07 DIAGNOSIS — I252 Old myocardial infarction: Secondary | ICD-10-CM

## 2023-01-07 DIAGNOSIS — Z85828 Personal history of other malignant neoplasm of skin: Secondary | ICD-10-CM

## 2023-01-07 DIAGNOSIS — Z7901 Long term (current) use of anticoagulants: Secondary | ICD-10-CM

## 2023-01-07 DIAGNOSIS — Z9841 Cataract extraction status, right eye: Secondary | ICD-10-CM

## 2023-01-07 DIAGNOSIS — Z8616 Personal history of COVID-19: Secondary | ICD-10-CM

## 2023-01-07 DIAGNOSIS — Z9842 Cataract extraction status, left eye: Secondary | ICD-10-CM

## 2023-01-07 DIAGNOSIS — Z91013 Allergy to seafood: Secondary | ICD-10-CM

## 2023-01-07 DIAGNOSIS — N182 Chronic kidney disease, stage 2 (mild): Secondary | ICD-10-CM | POA: Diagnosis present

## 2023-01-07 DIAGNOSIS — E039 Hypothyroidism, unspecified: Secondary | ICD-10-CM | POA: Diagnosis present

## 2023-01-07 DIAGNOSIS — I129 Hypertensive chronic kidney disease with stage 1 through stage 4 chronic kidney disease, or unspecified chronic kidney disease: Secondary | ICD-10-CM | POA: Diagnosis present

## 2023-01-07 HISTORY — DX: COVID-19: U07.1

## 2023-01-07 HISTORY — DX: Pneumonia due to coronavirus disease 2019: J12.82

## 2023-01-07 LAB — CBC WITH DIFFERENTIAL/PLATELET
Abs Immature Granulocytes: 0.05 10*3/uL (ref 0.00–0.07)
Basophils Absolute: 0 10*3/uL (ref 0.0–0.1)
Basophils Relative: 1 %
Eosinophils Absolute: 0.1 10*3/uL (ref 0.0–0.5)
Eosinophils Relative: 1 %
HCT: 44 % (ref 39.0–52.0)
Hemoglobin: 14.7 g/dL (ref 13.0–17.0)
Immature Granulocytes: 1 %
Lymphocytes Relative: 14 %
Lymphs Abs: 1.1 10*3/uL (ref 0.7–4.0)
MCH: 30.6 pg (ref 26.0–34.0)
MCHC: 33.4 g/dL (ref 30.0–36.0)
MCV: 91.7 fL (ref 80.0–100.0)
Monocytes Absolute: 0.7 10*3/uL (ref 0.1–1.0)
Monocytes Relative: 9 %
Neutro Abs: 6 10*3/uL (ref 1.7–7.7)
Neutrophils Relative %: 74 %
Platelets: 168 10*3/uL (ref 150–400)
RBC: 4.8 MIL/uL (ref 4.22–5.81)
RDW: 13.2 % (ref 11.5–15.5)
WBC: 7.9 10*3/uL (ref 4.0–10.5)
nRBC: 0 % (ref 0.0–0.2)

## 2023-01-07 LAB — PROTIME-INR
INR: 2.2 — ABNORMAL HIGH (ref 0.8–1.2)
Prothrombin Time: 24.4 seconds — ABNORMAL HIGH (ref 11.4–15.2)

## 2023-01-07 LAB — COMPREHENSIVE METABOLIC PANEL
ALT: 29 U/L (ref 0–44)
AST: 39 U/L (ref 15–41)
Albumin: 4.1 g/dL (ref 3.5–5.0)
Alkaline Phosphatase: 89 U/L (ref 38–126)
Anion gap: 13 (ref 5–15)
BUN: 16 mg/dL (ref 8–23)
CO2: 22 mmol/L (ref 22–32)
Calcium: 8.9 mg/dL (ref 8.9–10.3)
Chloride: 98 mmol/L (ref 98–111)
Creatinine, Ser: 1.16 mg/dL (ref 0.61–1.24)
GFR, Estimated: 58 mL/min — ABNORMAL LOW (ref >60)
Glucose, Bld: 125 mg/dL — ABNORMAL HIGH (ref 70–99)
Potassium: 3.8 mmol/L (ref 3.5–5.1)
Sodium: 133 mmol/L — ABNORMAL LOW (ref 135–145)
Total Bilirubin: 1.9 mg/dL — ABNORMAL HIGH (ref 0.3–1.2)
Total Protein: 7.9 g/dL (ref 6.5–8.1)

## 2023-01-07 LAB — APTT: aPTT: 45 seconds — ABNORMAL HIGH (ref 24–36)

## 2023-01-07 LAB — LACTIC ACID, PLASMA: Lactic Acid, Venous: 1.8 mmol/L (ref 0.5–1.9)

## 2023-01-07 LAB — SARS CORONAVIRUS 2 BY RT PCR: SARS Coronavirus 2 by RT PCR: POSITIVE — AB

## 2023-01-07 MED ORDER — POLYETHYLENE GLYCOL 3350 17 GM/SCOOP PO POWD: 17.0000 g | Freq: Every day | ORAL | Status: DC | PRN

## 2023-01-07 MED ORDER — LINACLOTIDE 72 MCG PO CAPS
72.0000 ug | ORAL_CAPSULE | Freq: Every day | ORAL | Status: DC
  Administered 2023-01-08 – 2023-01-09 (×2): 72 ug via ORAL
  Filled 2023-01-07 (×2): qty 1

## 2023-01-07 MED ORDER — IPRATROPIUM-ALBUTEROL 20-100 MCG/ACT IN AERS
1.0000 | INHALATION_SPRAY | Freq: Four times a day (QID) | RESPIRATORY_TRACT | Status: DC
  Administered 2023-01-07 – 2023-01-09 (×6): 1 via RESPIRATORY_TRACT
  Filled 2023-01-07: qty 4

## 2023-01-07 MED ORDER — ONDANSETRON HCL 4 MG/2ML IJ SOLN
4.0000 mg | Freq: Four times a day (QID) | INTRAMUSCULAR | Status: DC | PRN
  Administered 2023-01-08: 4 mg via INTRAVENOUS
  Filled 2023-01-07: qty 2

## 2023-01-07 MED ORDER — RIVAROXABAN 20 MG PO TABS
20.0000 mg | ORAL_TABLET | Freq: Every day | ORAL | Status: DC
  Administered 2023-01-08: 20 mg via ORAL
  Filled 2023-01-07 (×3): qty 1

## 2023-01-07 MED ORDER — IPRATROPIUM-ALBUTEROL 0.5-2.5 (3) MG/3ML IN SOLN: 3.0000 mL | Freq: Four times a day (QID) | RESPIRATORY_TRACT | Status: DC

## 2023-01-07 MED ORDER — PREDNISONE 20 MG PO TABS
40.0000 mg | ORAL_TABLET | Freq: Every day | ORAL | Status: DC
  Administered 2023-01-08 – 2023-01-09 (×2): 40 mg via ORAL
  Filled 2023-01-07 (×2): qty 2

## 2023-01-07 MED ORDER — SODIUM CHLORIDE 0.9 % IV SOLN: Freq: Once | INTRAVENOUS | Status: AC

## 2023-01-07 MED ORDER — ALBUTEROL SULFATE (2.5 MG/3ML) 0.083% IN NEBU
2.5000 mg | INHALATION_SOLUTION | Freq: Once | RESPIRATORY_TRACT | Status: AC
  Administered 2023-01-07: 2.5 mg via RESPIRATORY_TRACT
  Filled 2023-01-07: qty 3

## 2023-01-07 MED ORDER — LACTATED RINGERS IV SOLN: INTRAVENOUS | Status: AC

## 2023-01-07 MED ORDER — BENZONATATE 100 MG PO CAPS: 200.0000 mg | ORAL_CAPSULE | Freq: Three times a day (TID) | ORAL | Status: DC | PRN

## 2023-01-07 MED ORDER — ACETAMINOPHEN 650 MG RE SUPP: 650.0000 mg | Freq: Four times a day (QID) | RECTAL | Status: DC | PRN

## 2023-01-07 MED ORDER — SODIUM CHLORIDE 0.9 % IV SOLN
200.0000 mg | Freq: Once | INTRAVENOUS | Status: AC
  Administered 2023-01-07: 200 mg via INTRAVENOUS
  Filled 2023-01-07: qty 40

## 2023-01-07 MED ORDER — ONDANSETRON HCL 4 MG PO TABS: 4.0000 mg | ORAL_TABLET | Freq: Four times a day (QID) | ORAL | Status: DC | PRN

## 2023-01-07 MED ORDER — SODIUM CHLORIDE 0.9 % IV SOLN
100.0000 mg | Freq: Every day | INTRAVENOUS | Status: AC
  Administered 2023-01-08 – 2023-01-09 (×2): 100 mg via INTRAVENOUS
  Filled 2023-01-07 (×2): qty 20

## 2023-01-07 MED ORDER — METHYLPREDNISOLONE SODIUM SUCC 40 MG IJ SOLR
40.0000 mg | Freq: Once | INTRAMUSCULAR | Status: AC
  Administered 2023-01-07: 40 mg via INTRAVENOUS
  Filled 2023-01-07: qty 1

## 2023-01-07 MED ORDER — GUAIFENESIN ER 600 MG PO TB12
600.0000 mg | ORAL_TABLET | Freq: Two times a day (BID) | ORAL | Status: DC
  Administered 2023-01-07 – 2023-01-09 (×5): 600 mg via ORAL
  Filled 2023-01-07 (×5): qty 1

## 2023-01-07 MED ORDER — ADULT MULTIVITAMIN W/MINERALS CH
1.0000 | ORAL_TABLET | Freq: Every day | ORAL | Status: DC
  Administered 2023-01-07 – 2023-01-09 (×3): 1 via ORAL
  Filled 2023-01-07 (×3): qty 1

## 2023-01-07 MED ORDER — SODIUM CHLORIDE 0.9% FLUSH
3.0000 mL | Freq: Two times a day (BID) | INTRAVENOUS | Status: DC
  Administered 2023-01-07 – 2023-01-09 (×4): 3 mL via INTRAVENOUS

## 2023-01-07 MED ORDER — ACETAMINOPHEN 325 MG PO TABS: 650.0000 mg | ORAL_TABLET | Freq: Four times a day (QID) | ORAL | Status: DC | PRN

## 2023-01-07 NOTE — ED Notes (Signed)
Advised nurse that patient has ready bed 

## 2023-01-07 NOTE — Assessment & Plan Note (Deleted)
Patient is presenting with 2-day history of generalized malaise, weakness, fever found to be COVID-19 positive.  No new opacities seen on chest x-ray.  - Continuous pulse oximetry - Continue supplemental oxygen to maintain oxygen saturation above 88% - Will discuss with pharmacy regarding Paxlovid versus remdesivir

## 2023-01-07 NOTE — Assessment & Plan Note (Signed)
In the setting of COVID-19.  - S/p Solu-Medrol 125 mg once - Start prednisone 40 mg tomorrow to complete a 5-day course - DuoNebs every 6 hours - Continue home bronchodilators

## 2023-01-07 NOTE — ED Provider Notes (Signed)
[facility] Provider Note    Event Date/Time   First MD Initiated Contact with Patient 01/07/23 1048     (approximate)   History   Weakness and Nausea   HPI  Joseph Osborn is a 89 y.o. male presents to the ER for evaluation of generalized malaise fatigue inability to walk due to myalgias as well as fevers at home to 100.4.  Discussed his flu shot few days ago.  Denies any abdominal pain.  Denies any chest pain.     Physical Exam   Triage Vital Signs: ED Triage Vitals  Encounter Vitals Group     BP 01/07/23 1051 119/75     Systolic BP Percentile --      Diastolic BP Percentile --      Pulse Rate 01/07/23 1051 (!) 102     Resp 01/07/23 1051 (!) 27     Temp 01/07/23 1051 98.9 F (37.2 C)     Temp Source 01/07/23 1051 Oral     SpO2 01/07/23 1049 94 %     Weight 01/07/23 1053 171 lb 8.3 oz (77.8 kg)     Height 01/07/23 1053 5\' 4"  (1.626 m)     Head Circumference --      Peak Flow --      Pain Score 01/07/23 1052 0     Pain Loc --      Pain Education --      Exclude from Growth Chart --     Most recent vital signs: Vitals:   01/07/23 1130 01/07/23 1200  BP: 122/74 116/72  Pulse: 94 95  Resp: (!) 24 (!) 25  Temp:    SpO2: 93% 92%     Constitutional: Alert  Eyes: Conjunctivae are normal.  Head: Atraumatic. Nose: No congestion/rhinnorhea. Mouth/Throat: Mucous membranes are moist.   Neck: Painless ROM.  Cardiovascular:   Good peripheral circulation. Respiratory: Normal respiratory effort.  No retractions.  Gastrointestinal: Soft and nontender.  Musculoskeletal:  no deformity Neurologic:  MAE spontaneously. No gross focal neurologic deficits are appreciated.  Skin:  Skin is warm, dry and intact. No rash noted. Psychiatric: Mood and affect are normal. Speech and behavior are normal.    ED Results / Procedures / Treatments   Labs (all labs ordered are listed, but only abnormal results are displayed) Labs Reviewed  SARS  CORONAVIRUS 2 BY RT PCR - Abnormal; Notable for the following components:      Result Value   SARS Coronavirus 2 by RT PCR POSITIVE (*)    All other components within normal limits  COMPREHENSIVE METABOLIC PANEL - Abnormal; Notable for the following components:   Sodium 133 (*)    Glucose, Bld 125 (*)    Total Bilirubin 1.9 (*)    GFR, Estimated 58 (*)    All other components within normal limits  PROTIME-INR - Abnormal; Notable for the following components:   Prothrombin Time 24.4 (*)    INR 2.2 (*)    All other components within normal limits  APTT - Abnormal; Notable for the following components:   aPTT 45 (*)    All other components within normal limits  CULTURE, BLOOD (ROUTINE X 2)  CULTURE, BLOOD (ROUTINE X 2)  LACTIC ACID, PLASMA  CBC WITH DIFFERENTIAL/PLATELET  URINALYSIS, W/ REFLEX TO CULTURE (INFECTION SUSPECTED)     EKG  ED ECG REPORT I, Willy Eddy, the attending physician, personally viewed and interpreted this ECG.   Date: 01/07/2023  EKG Time: 10:54  Rate: 100  Rhythm: sinus  Axis: left  Intervals: normal  ST&T Change: no stemi, no depressions    RADIOLOGY Please see ED Course for my review and interpretation.  I personally reviewed all radiographic images ordered to evaluate for the above acute complaints and reviewed radiology reports and findings.  These findings were personally discussed with the patient.  Please see medical record for radiology report.    PROCEDURES:  Critical Care performed: No  Procedures   MEDICATIONS ORDERED IN ED: Medications  albuterol (PROVENTIL) (2.5 MG/3ML) 0.083% nebulizer solution 2.5 mg (2.5 mg Nebulization Given 01/07/23 1306)  0.9 %  sodium chloride infusion ( Intravenous New Bag/Given 01/07/23 1306)     IMPRESSION / MDM / ASSESSMENT AND PLAN / ED COURSE  I reviewed the triage vital signs and the nursing notes.                              Differential diagnosis includes, but is not limited to,  viral illness, dehydration, sepsis, COVID, pneumonia, UTI, electrolyte abnormality  Patient presenting to the ER for evaluation of symptoms as described above.  Based on symptoms, risk factors and considered above differential, this presenting complaint could reflect a potentially life-threatening illness therefore the patient will be placed on continuous pulse oximetry and telemetry for monitoring.  Laboratory evaluation will be sent to evaluate for the above complaints.      Clinical Course as of 01/07/23 1316  Sat Jan 07, 2023  1243 Patient is COVID-positive. [PR]    Clinical Course User Index [PR] Willy Eddy, MD   Given the patient's weakness age risk factors and ability to walk with acute COVID will consult hospitalist for admission.  FINAL CLINICAL IMPRESSION(S) / ED DIAGNOSES   Final diagnoses:  COVID-19     Rx / DC Orders   ED Discharge Orders     None        Note:  This document was prepared using Dragon voice recognition software and may include unintentional dictation errors.    Willy Eddy, MD 01/07/23 1316

## 2023-01-07 NOTE — Assessment & Plan Note (Addendum)
Patient is presenting with 2-day history of generalized malaise, weakness, fever found to be COVID-19 positive.  No new opacities seen on chest x-ray. - Continuous pulse oximetry - Continue supplemental oxygen to maintain oxygen saturation above 88% - Given moderate symptoms with need for supplemental oxygen, will order remdesivir per [facility] guidelines-we will complete a 3-day course - Zofran as needed for send - Tessalon Perles and Mucinex as needed for cough

## 2023-01-07 NOTE — Assessment & Plan Note (Signed)
Renal function currently at baseline.  - Repeat BMP in the a.m.

## 2023-01-07 NOTE — Assessment & Plan Note (Addendum)
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  - Continue home Xarelto

## 2023-01-07 NOTE — Assessment & Plan Note (Addendum)
No chest pain reported at this time

## 2023-01-07 NOTE — H&P (Signed)
History and Physical    Patient: Joseph Osborn MWN:[id number] DOB: Jun 06, 1925 DOA: 01/07/2023 DOS: the patient was seen and examined on 01/07/2023 PCP: Eustaquio Boyden, MD  Patient coming from: Home  Chief Complaint:  Chief Complaint  Patient presents with   Weakness   Nausea   HPI: Joseph Osborn is a 89 y.o. male with medical history significant of recent PE on Eliquis, COPD, hypertension, CAD c/b MI (? 2000), CKD stage IIIa, hypothyroidism, chronic venous insufficiency who presents to the ED due to generalized weakness.  [name] had his flu shot approximately 3 days ago and then last he developed nausea and vomiting with emesis that was nonbloody, nonbilious but noted to be brown in color with coffee grounds.  In addition, he has been experiencing a productive cough with fever, generalized weakness and malaise. He denies any chest pain, palpitations or shortness of breath.  He notes that he feels much better since arriving to the ER.  ED course: On arrival to the ED, patient was normotensive at 119/75 with a heart rate of 99.  He was saturating at 94% on room air.  He was afebrile at 98.9.  He was tachypneic at 30/minute. Initial workup notable for normal CBC and CMP with sodium of 133, glucose 125, creatinine 1.16 with GFR 58.  Lactic acid within normal limits.  COVID-19 PCR positive.  Chest x-ray with no evidence of new opacities.  Patient started on breathing treatment and [facility] contacted for admission.  Review of Systems: As mentioned in the history of present illness. All other systems reviewed and are negative.  Past Medical History:  Diagnosis Date   Arthritis    Basal cell carcinoma 02/04/2021   L groin, excised 03/23/21   BPH (benign prostatic hyperplasia)    CAD (coronary artery disease)    Chronic kidney disease    Stage III   Community acquired pneumonia 04/21/2021   Post COVID CAP s/p hospitalization   COPD (chronic obstructive pulmonary  disease) (HCC)    COVID-19 04/13/2021   DDD (degenerative disc disease), cervical    DVT (deep venous thrombosis) (HCC) 05/30/2022   Left leg   Hypertension    Lipoma 2017   Lower back pain    Myocardial infarction (HCC)    approx 2000   Thyroid disease    Hypothyroidism   Wears dentures    partial upper   Past Surgical History:  Procedure Laterality Date   BLADDER SURGERY  03/2008   bladder polyp removed [name])   CARDIAC CATHETERIZATION  2000   1 stent placed after MI   CATARACT EXTRACTION W/ INTRAOCULAR LENS  IMPLANT, BILATERAL  2014   [facility], Dr. Druscilla Brownie   CATARACT EXTRACTION W/PHACO Left 09/20/2022   Procedure: CATARACT EXTRACTION PHACO AND INTRAOCULAR LENS PLACEMENT (IOC) LEFT  9.43  00:57.3;  Surgeon: Galen Manila, MD;  Location: [facility];  Service: Ophthalmology;  Laterality: Left;   CHOLECYSTECTOMY     In [place]   COLONOSCOPY  03/13/2014   [facility], Dr. Mechele Collin   COLONOSCOPY WITH PROPOFOL N/A 03/03/2016   Procedure: COLONOSCOPY WITH PROPOFOL;  Surgeon: Midge Minium, MD;  Location: [facility];  Service: Endoscopy;  Laterality: N/A;   ESOPHAGOGASTRODUODENOSCOPY (EGD) WITH PROPOFOL N/A 03/03/2016   Procedure: ESOPHAGOGASTRODUODENOSCOPY (EGD) WITH PROPOFOL;  Surgeon: Midge Minium, MD;  Location: [facility];  Service: Endoscopy;  Laterality: N/A;  Interpreter needed   HEMORRHOID SURGERY     [place], [place]   TRANSURETHRAL RESECTION  OF PROSTATE  03/24/2008   Dr Sheppard Penton, [facility]   Social History:  reports that he has never smoked. He has never used smokeless tobacco. He reports that he does not currently use alcohol. He reports that he does not use drugs.  Allergies  Allergen Reactions   Shellfish Allergy Anaphylaxis   Oxybutynin Other (See Comments)    Doesn't remember reaction   Statins Other (See Comments)    Statin intolerance per cardiology notes    Family History  Problem Relation Age of Onset   Heart disease  Mother    Heart attack Mother    Liver cancer Cousin    Diabetes Neg Hx     Prior to Admission medications   Medication Sig Start Date End Date Taking? Authorizing Provider  acetaminophen (TYLENOL) 500 MG tablet Take 2 tablets (1,000 mg total) by mouth in the morning, at noon, and at bedtime. 02/14/22   Eustaquio Boyden, MD  Cholecalciferol (VITAMIN D3) 25 MCG (1000 UT) capsule Take 1 capsule (1,000 Units total) by mouth daily. 08/15/22   Eustaquio Boyden, MD  linaclotide [facility]) 72 MCG capsule Take 1 capsule (72 mcg total) by mouth daily before breakfast. 01/04/23   Eustaquio Boyden, MD  polyethylene glycol powder (GLYCOLAX/MIRALAX) 17 GM/SCOOP powder Take 17 g by mouth daily as needed for moderate constipation. 01/04/23   Eustaquio Boyden, MD  rivaroxaban (XARELTO) 20 MG TABS tablet Take 1 tablet (20 mg total) by mouth daily with supper. 12/29/22   Eustaquio Boyden, MD    Physical Exam: Vitals:   01/07/23 1200 01/07/23 1230 01/07/23 1300 01/07/23 1330  BP: 116/72 110/70 114/66 112/67  Pulse: 95 87 88 87  Resp: (!) 25 (!) 23 (!) 26 (!) 23  Temp:      TempSrc:      SpO2: 92% 95% 96% 96%  Weight:      Height:       Physical Exam Vitals and nursing note reviewed.  Constitutional:      General: He is not in acute distress.    Appearance: He is not toxic-appearing.     Comments: Sleeping but awakes easily  HENT:     Head: Normocephalic and atraumatic.     Mouth/Throat:     Mouth: Mucous membranes are moist.     Pharynx: Oropharynx is clear.  Eyes:     Conjunctiva/sclera: Conjunctivae normal.     Pupils: Pupils are equal, round, and reactive to light.  Cardiovascular:     Rate and Rhythm: Normal rate and regular rhythm.     Heart sounds: No murmur heard.    No gallop.  Pulmonary:     Effort: Pulmonary effort is normal. No respiratory distress.     Breath sounds: Rhonchi (Predominantly left mid and lower lung fields) present. No wheezing or rales.  Abdominal:     General:  Bowel sounds are normal. There is no distension.     Palpations: Abdomen is soft.     Tenderness: There is no abdominal tenderness. There is no guarding.  Musculoskeletal:     Right lower leg: No edema.     Left lower leg: No edema.  Skin:    General: Skin is warm and dry.  Neurological:     General: No focal deficit present.     Mental Status: Mental status is at baseline.  Psychiatric:        Mood and Affect: Mood normal.        Behavior: Behavior normal.    Data Reviewed:  CBC with WBC of 7.9, hemoglobin of 14.7, platelets 168 CMP with sodium of 133, potassium 3.8, bicarb 22, glucose 125, BUN 16, creatinine 1.16, AST 39, ALT 29, total bilirubin 1.9 GFR 58 Lactic acid 1.8 INR 2.2 PTT 45  EKG personally reviewed.  Sinus rhythm with rate of 103.  No ST or T wave changes concerning for acute ischemia.  DG Chest Port 1 View  Result Date: 01/07/2023 CLINICAL DATA:  Weakness with nausea, vomiting and fever since yesterday. Possible sepsis. EXAM: PORTABLE CHEST 1 VIEW COMPARISON:  Chest CTA 12/07/2022. Abdominal CT 12/29/2022. radiographs 12/07/2022 and 02/21/2022. FINDINGS: 1133 hours. The overall pulmonary shin has mildly improved compared with the most recent radiographs. Stable cardiomegaly and aortic atherosclerosis. Patchy left greater than right basilar pulmonary opacities are similar to recent prior studies and may reflect atelectasis or scarring related to previous pulmonary embolism. No progressive airspace disease, edema, significant pleural effusion or pneumothorax demonstrated. The bones appear unchanged.  Telemetry leads overlie the chest. IMPRESSION: 1. Mildly improved pulmonary aeration compared with most recent radiographs. No progressive airspace disease or edema. 2. Stable cardiomegaly and chronic bibasilar pulmonary opacities. Electronically Signed   By: Carey Bullocks M.D.   On: 01/07/2023 11:54    Results are pending, will review when available.  Assessment and  Plan:  * Pneumonia due to COVID-19 virus Patient is presenting with 2-day history of generalized malaise, weakness, fever found to be COVID-19 positive.  No new opacities seen on chest x-ray, however pulmonary examination with rhonchi bilaterally.   - Continuous pulse oximetry - Continue supplemental oxygen to maintain oxygen saturation above 88% - Given moderate symptoms with need for supplemental oxygen, will order remdesivir per [facility] guidelines - Zofran as needed for send - Gentle IV fluids - Tessalon Perles and Mucinex as needed for cough  COPD with acute exacerbation (HCC) In the setting of COVID-19.  - S/p Solu-Medrol 125 mg once - Start prednisone 40 mg tomorrow to complete a 5-day course - DuoNebs every 6 hours - Continue home bronchodilators  Pulmonary embolism and infarction [facility]) Recent admission for PE complicated by pulmonary infarcts.  - Continue home Xarelto  CKD (chronic kidney disease), stage III (HCC) Renal function currently at baseline.  - Repeat BMP in the a.m.  CAD (coronary artery disease) No chest pain reported at this time.  Advance Care Planning:   Code Status: Limited: Do not attempt resuscitation (DNR) -DNR-LIMITED -Do Not Intubate/DNI  Confirmed by family with GOLD form present in the chart.   Consults: None  Family Communication: Patient's daughters updated at bedside.   Severity of Illness: The appropriate patient status for this patient is OBSERVATION. Observation status is judged to be reasonable and necessary in order to provide the required intensity of service to ensure the patient's safety. The patient's presenting symptoms, physical exam findings, and initial radiographic and laboratory data in the context of their medical condition is felt to place them at decreased risk for further clinical deterioration. Furthermore, it is anticipated that the patient will be medically stable for discharge from the hospital within 2 midnights of  admission.   Author: Verdene Lennert, MD 01/07/2023 2:00 PM  For on call review [email or web address].

## 2023-01-07 NOTE — ED Triage Notes (Signed)
Pt brought in from home by EMS for weakness, n/v, and fever that started yesterday.

## 2023-01-08 DIAGNOSIS — Z7901 Long term (current) use of anticoagulants: Secondary | ICD-10-CM | POA: Diagnosis not present

## 2023-01-08 DIAGNOSIS — J441 Chronic obstructive pulmonary disease with (acute) exacerbation: Secondary | ICD-10-CM | POA: Diagnosis present

## 2023-01-08 DIAGNOSIS — I252 Old myocardial infarction: Secondary | ICD-10-CM | POA: Diagnosis not present

## 2023-01-08 DIAGNOSIS — I2699 Other pulmonary embolism without acute cor pulmonale: Secondary | ICD-10-CM | POA: Diagnosis not present

## 2023-01-08 DIAGNOSIS — N400 Enlarged prostate without lower urinary tract symptoms: Secondary | ICD-10-CM | POA: Diagnosis present

## 2023-01-08 DIAGNOSIS — E871 Hypo-osmolality and hyponatremia: Secondary | ICD-10-CM | POA: Diagnosis present

## 2023-01-08 DIAGNOSIS — Z9842 Cataract extraction status, left eye: Secondary | ICD-10-CM | POA: Diagnosis not present

## 2023-01-08 DIAGNOSIS — Z8616 Personal history of COVID-19: Secondary | ICD-10-CM | POA: Diagnosis not present

## 2023-01-08 DIAGNOSIS — I872 Venous insufficiency (chronic) (peripheral): Secondary | ICD-10-CM | POA: Diagnosis present

## 2023-01-08 DIAGNOSIS — I129 Hypertensive chronic kidney disease with stage 1 through stage 4 chronic kidney disease, or unspecified chronic kidney disease: Secondary | ICD-10-CM | POA: Diagnosis present

## 2023-01-08 DIAGNOSIS — J1282 Pneumonia due to coronavirus disease 2019: Secondary | ICD-10-CM | POA: Diagnosis present

## 2023-01-08 DIAGNOSIS — I2510 Atherosclerotic heart disease of native coronary artery without angina pectoris: Secondary | ICD-10-CM | POA: Diagnosis present

## 2023-01-08 DIAGNOSIS — Z85828 Personal history of other malignant neoplasm of skin: Secondary | ICD-10-CM | POA: Diagnosis not present

## 2023-01-08 DIAGNOSIS — R0682 Tachypnea, not elsewhere classified: Secondary | ICD-10-CM | POA: Diagnosis present

## 2023-01-08 DIAGNOSIS — Z9079 Acquired absence of other genital organ(s): Secondary | ICD-10-CM | POA: Diagnosis not present

## 2023-01-08 DIAGNOSIS — Z9841 Cataract extraction status, right eye: Secondary | ICD-10-CM | POA: Diagnosis not present

## 2023-01-08 DIAGNOSIS — J440 Chronic obstructive pulmonary disease with acute lower respiratory infection: Secondary | ICD-10-CM | POA: Diagnosis present

## 2023-01-08 DIAGNOSIS — Z961 Presence of intraocular lens: Secondary | ICD-10-CM | POA: Diagnosis present

## 2023-01-08 DIAGNOSIS — Z9049 Acquired absence of other specified parts of digestive tract: Secondary | ICD-10-CM | POA: Diagnosis not present

## 2023-01-08 DIAGNOSIS — Z8249 Family history of ischemic heart disease and other diseases of the circulatory system: Secondary | ICD-10-CM | POA: Diagnosis not present

## 2023-01-08 DIAGNOSIS — U071 COVID-19: Secondary | ICD-10-CM | POA: Diagnosis present

## 2023-01-08 DIAGNOSIS — N1831 Chronic kidney disease, stage 3a: Secondary | ICD-10-CM | POA: Diagnosis present

## 2023-01-08 DIAGNOSIS — E039 Hypothyroidism, unspecified: Secondary | ICD-10-CM | POA: Diagnosis present

## 2023-01-08 DIAGNOSIS — Z955 Presence of coronary angioplasty implant and graft: Secondary | ICD-10-CM | POA: Diagnosis not present

## 2023-01-08 DIAGNOSIS — Z66 Do not resuscitate: Secondary | ICD-10-CM | POA: Diagnosis present

## 2023-01-08 LAB — CBC
HCT: 40.5 % (ref 39.0–52.0)
Hemoglobin: 13.6 g/dL (ref 13.0–17.0)
MCH: 30.8 pg (ref 26.0–34.0)
MCHC: 33.6 g/dL (ref 30.0–36.0)
MCV: 91.6 fL (ref 80.0–100.0)
Platelets: 153 10*3/uL (ref 150–400)
RBC: 4.42 MIL/uL (ref 4.22–5.81)
RDW: 13.2 % (ref 11.5–15.5)
WBC: 9.8 10*3/uL (ref 4.0–10.5)
nRBC: 0 % (ref 0.0–0.2)

## 2023-01-08 LAB — BASIC METABOLIC PANEL
Anion gap: 9 (ref 5–15)
BUN: 19 mg/dL (ref 8–23)
CO2: 23 mmol/L (ref 22–32)
Calcium: 8.6 mg/dL — ABNORMAL LOW (ref 8.9–10.3)
Chloride: 104 mmol/L (ref 98–111)
Creatinine, Ser: 0.99 mg/dL (ref 0.61–1.24)
GFR, Estimated: >60 mL/min (ref >60)
Glucose, Bld: 134 mg/dL — ABNORMAL HIGH (ref 70–99)
Potassium: 3.8 mmol/L (ref 3.5–5.1)
Sodium: 136 mmol/L (ref 135–145)

## 2023-01-08 NOTE — Progress Notes (Signed)
Progress Note   Patient: Joseph Osborn ZHY:[id number] DOB: [birth year] DOA: 01/07/2023     0 DOS: the patient was seen and examined on 01/08/2023   Brief hospital course: Taken from H&P.   PRAMOD HARDIMAN [name] is a 89 y.o. male with medical history significant of recent PE on Eliquis, COPD, hypertension, CAD c/b MI (? 2000), CKD stage IIIa, hypothyroidism, chronic venous insufficiency who presents to the ED due to generalized weakness.   [name] had his flu shot approximately 3 days ago and then last he developed nausea and vomiting with emesis that was nonbloody, nonbilious but noted to be brown in color with coffee grounds.  he has been experiencing a productive cough with fever, generalized weakness and malaise.  On presentation he was hemodynamically stable, 94% on room air, mildly tachypneic, labs with mild hide bone natremia at 133, creatinine 1.16, COVID-19 PCR positive. Chest x-ray with no acute abnormality.  9/1: Vital stable, preliminary blood cultures negative, hyponatremia resolved.  Patient was started on remdesivir by admitting provider-will complete a 3-day course          Assessment and Plan: * Pneumonia due to COVID-19 virus Patient is presenting with 2-day history of generalized malaise, weakness, fever found to be COVID-19 positive.  No new opacities seen on chest x-ray. - Continuous pulse oximetry - Continue supplemental oxygen to maintain oxygen saturation above 88% - Given moderate symptoms with need for supplemental oxygen, will order remdesivir per [facility] guidelines-we will complete a 3-day course - Zofran as needed for send - Tessalon Perles and Mucinex as needed for cough  COPD with acute exacerbation (HCC) In the setting of COVID-19.  - S/p Solu-Medrol 125 mg once - Start prednisone 40 mg  to complete a 5-day course - DuoNebs every 6 hours - Continue home bronchodilators  Pulmonary embolism and infarction [facility]) Recent admission for PE  complicated by pulmonary infarcts.  - Continue home Xarelto  CKD (chronic kidney disease), stage III (HCC) Renal function currently at baseline.  - Repeat BMP in the a.m.  CAD (coronary artery disease) No chest pain reported at this time.   Subjective: Patient was seen and examined today.  No new concern.  Daughter is at bedside who helped with interpretation.  Patient is a Spanish-speaking gentleman  Physical Exam: Vitals:   01/07/23 1840 01/07/23 2200 01/08/23 0538 01/08/23 0902  BP: (!) 140/80 (!) 154/79 109/70 117/78  Pulse: 83 80 76 73  Resp: 18 18 20 18   Temp: 97.8 F (36.6 C) 97.7 F (36.5 C) 97.6 F (36.4 C) (!) 97.5 F (36.4 C)  TempSrc: Oral Oral Oral   SpO2: 95% 95% 90% 96%  Weight:      Height:       General.  Frail elderly man, in no acute distress. Pulmonary.  Lungs clear bilaterally, normal respiratory effort. CV.  Regular rate and rhythm, no JVD, rub or murmur. Abdomen.  Soft, nontender, nondistended, BS positive. CNS.  Alert and oriented .  No focal neurologic deficit. Extremities.  No edema, no cyanosis, pulses intact and symmetrical. Psychiatry.  Judgment and insight appears normal.   Data Reviewed: Prior data reviewed  Family Communication: Chest with 2 daughters at bedside  Disposition: Status is: Inpatient Remains inpatient appropriate because: Severity of illness  Planned Discharge Destination: Home  DVT prophylaxis.  Xarelto Time spent: 45 minutes  This record has been created using Conservation officer, historic buildings. Errors have been sought and corrected,but may not always be located. Such  creation errors do not reflect on the standard of care.   Author: Arnetha Courser, MD 01/08/2023 3:55 PM  For on call review [email or web address].

## 2023-01-08 NOTE — Evaluation (Signed)
Physical Therapy Evaluation Patient Details Name: Joseph Osborn MRN: [id number] DOB: 02-Aug-1925 Today's Date: 01/08/2023  History of Present Illness  Patient is a 89 year old male who presented to ED for generalized weakness admitted for Pneumonia . Patient had a flu shot 3 days ago, developed nausea and vomiting with emesis, fever, and productive cough. PMH includes recent PE on eliquis, COPD, HTN, CAD c/b MI, CKD stage IIIa, hypothyroidism,DVTs, and chronic venous insufficiency.   Clinical Impression  Patient is a pleasant 89 year old male who presents with generalized weakness. Patient's daughter is in room and assists with history and interpreting. Prior to hospital admission, pt was independent with assistive device and lives at home with his wife and daughter.  He is currently getting home health PT twice a week per daughter. Patient is agreeable to participate with physical therapy evaluation. He has 96% SP02 on 2L of 02 via nasal cannula in bed, SP02 remains>90% transferring to EOB.  It does however drop to 85% with ambulation with RW requiring cueing to breath through nose and pause in ambulation with seated rest break to return to >90%. Patient educated on seated and standing strengthening and mobility with focus on respiratory awareness and maintaining SP02>90%. Patient is fatigued and cold by end of session and returned to bed with needs met.  Pt would benefit from skilled PT to address noted impairments and functional limitations (see below for any additional details).          If plan is discharge home, recommend the following: A little help with walking and/or transfers;Assistance with cooking/housework;A little help with bathing/dressing/bathroom;Help with stairs or ramp for entrance   Can travel by private vehicle        Equipment Recommendations None recommended by PT  Recommendations for Other Services       Functional Status Assessment Patient has had a recent  decline in their functional status and demonstrates the ability to make significant improvements in function in a reasonable and predictable amount of time.     Precautions / Restrictions Precautions Precautions: Fall Precaution Comments: 3 falls last month Restrictions Weight Bearing Restrictions: No      Mobility  Bed Mobility Overal bed mobility: Modified Independent             General bed mobility comments: Increased time to get into/out of bed.    Transfers Overall transfer level: Needs assistance Equipment used: Rolling walker (2 wheels) Transfers: Sit to/from Stand Sit to Stand: Contact guard assist           General transfer comment: Patient transfers with CGA/supervision. Sp02 monitored    Ambulation/Gait Ambulation/Gait assistance: Contact guard assist Gait Distance (Feet): 40 Feet Assistive device: Rolling walker (2 wheels) Gait Pattern/deviations: Step-through pattern, Narrow base of support Gait velocity: decreased     General Gait Details: Sp02 drop to 85% with ambulation. Returned to >90's with rest breaks and cues to breath through nose.  Stairs            Wheelchair Mobility     Tilt Bed    Modified Rankin (Stroke Patients Only)       Balance Overall balance assessment: Needs assistance Sitting-balance support: Feet unsupported Sitting balance-Leahy Scale: Fair Sitting balance - Comments: able to static sit, does have posterior lean with LE raise Postural control: Posterior lean Standing balance support: Bilateral upper extremity supported, During functional activity Standing balance-Leahy Scale: Fair Standing balance comment: requires use of RW for ambulation  Pertinent Vitals/Pain Pain Assessment Pain Assessment: No/denies pain    Home Living                          Prior Function                       Extremity/Trunk Assessment   Upper Extremity  Assessment Upper Extremity Assessment: Defer to OT evaluation    Lower Extremity Assessment Lower Extremity Assessment: Generalized weakness (Grossly 4/5 bilaterally. good coordination and effort)    Cervical / Trunk Assessment Cervical / Trunk Assessment: Normal  Communication   Communication Communication: Other (comment) (Spanish Speaking) Cueing Techniques: Verbal cues;Gestural cues;Tactile cues;Visual cues  Cognition Arousal: Alert Behavior During Therapy: WFL for tasks assessed/performed Overall Cognitive Status: Within Functional Limits for tasks assessed                                 General Comments: Daughter assists with interpretation.        General Comments General comments (skin integrity, edema, etc.): patient appears well groomed and nourished.    Exercises General Exercises - Lower Extremity Ankle Circles/Pumps: Strengthening, Both, 10 reps Long Arc Quad: Strengthening, Both, 5 reps Hip Flexion/Marching: Strengthening, Both, 10 reps, Standing Other Exercises Other Exercises: Patient and patient's family educated on role of PT in acute care setting, safe mobility and transfers, need for nose breathing with nasal cannula.   Assessment/Plan    PT Assessment Patient needs continued PT services  PT Problem List Decreased strength;Decreased activity tolerance;Decreased mobility;Decreased balance;Cardiopulmonary status limiting activity       PT Treatment Interventions DME instruction;Gait training;Stair training;Therapeutic exercise;Therapeutic activities;Functional mobility training;Balance training;Neuromuscular re-education;Manual techniques    PT Goals (Current goals can be found in the Care Plan section)  Acute Rehab PT Goals Patient Stated Goal: to return home PT Goal Formulation: With patient Time For Goal Achievement: 01/22/23 Potential to Achieve Goals: Fair    Frequency Min 3X/week     Co-evaluation                AM-PAC PT "6 Clicks" Mobility  Outcome Measure Help needed turning from your back to your side while in a flat bed without using bedrails?: None Help needed moving from lying on your back to sitting on the side of a flat bed without using bedrails?: A Little Help needed moving to and from a bed to a chair (including a wheelchair)?: A Little Help needed standing up from a chair using your arms (e.g., wheelchair or bedside chair)?: A Little Help needed to walk in hospital room?: A Little Help needed climbing 3-5 steps with a railing? : A Lot 6 Click Score: 18    End of Session Equipment Utilized During Treatment: Gait belt;Oxygen (2 L 02 via nasal cannula) Activity Tolerance: Patient tolerated treatment well Patient left: in bed;with bed alarm set;with call bell/phone within reach;with family/visitor present Nurse Communication: Mobility status PT Visit Diagnosis: Unsteadiness on feet (R26.81);Other abnormalities of gait and mobility (R26.89);History of falling (Z91.81);Muscle weakness (generalized) (M62.81);Difficulty in walking, not elsewhere classified (R26.2)    Time: 1191-4782 PT Time Calculation (min) (ACUTE ONLY): 23 min   Charges:   PT Evaluation $PT Eval Moderate Complexity: 1 Mod PT Treatments $Therapeutic Activity: 8-22 mins PT General Charges $$ ACUTE PT VISIT: 1 Visit           Precious Bard, DPT  01/08/2023,  10:10 AM

## 2023-01-08 NOTE — Progress Notes (Signed)
OT Cancellation Note  Patient Details Name: Joseph Osborn MRN: [id number] DOB: 01-Oct-1925   Cancelled Treatment:    Reason Eval/Treat Not Completed: Medical issues which prohibited therapy (pt states he feels nauseous, has not been able to eat his lunch, and has vomited a little bit; OT alerted RN; brought ginger ale for pt; will hold until tomorrow)  Alvester Morin 01/08/2023, 3:21 PM

## 2023-01-08 NOTE — Hospital Course (Addendum)
Taken from H&P.   Joseph Osborn Joseph Osborn is a 89 y.o. male with medical history significant of recent PE on Eliquis, COPD, hypertension, CAD c/b MI (? 2000), CKD stage IIIa, hypothyroidism, chronic venous insufficiency who presents to the ED due to generalized weakness.   [name] had his flu shot approximately 3 days ago and then last he developed nausea and vomiting with emesis that was nonbloody, nonbilious but noted to be brown in color with coffee grounds.  he has been experiencing a productive cough with fever, generalized weakness and malaise.  On presentation he was hemodynamically stable, 94% on room air, mildly tachypneic, labs with mild hide bone natremia at 133, creatinine 1.16, COVID-19 PCR positive. Chest x-ray with no acute abnormality.  9/1: Vital stable, preliminary blood cultures negative, hyponatremia resolved.  Patient was started on remdesivir by admitting provider-will complete a 3-day course.  9/2: Patient remained stable, completed 3 doses of remdesivir.  Preliminary blood cultures remain negative.  On room air.  Multiple family members at home with similar illness.  Patient is being discharged with 3 more days of prednisone and supportive care,  Patient will continue on current medications and need to have a close follow-up with his providers further recommendations.

## 2023-01-08 NOTE — Plan of Care (Signed)
  Problem: Clinical Measurements: Goal: Respiratory complications will improve Outcome: Progressing   Problem: Clinical Measurements: Goal: Cardiovascular complication will be avoided Outcome: Progressing   Problem: Clinical Measurements: Goal: Diagnostic test results will improve Outcome: Progressing   Problem: Activity: Goal: Risk for activity intolerance will decrease Outcome: Progressing   Problem: Nutrition: Goal: Adequate nutrition will be maintained Outcome: Progressing   Problem: Coping: Goal: Level of anxiety will decrease Outcome: Progressing   Problem: Pain Managment: Goal: General experience of comfort will improve Outcome: Progressing   Problem: Safety: Goal: Ability to remain free from injury will improve Outcome: Progressing   Problem: Safety: Goal: Ability to remain free from injury will improve Outcome: Progressing

## 2023-01-09 ENCOUNTER — Other Ambulatory Visit: Payer: Self-pay | Admitting: Internal Medicine

## 2023-01-09 DIAGNOSIS — I2699 Other pulmonary embolism without acute cor pulmonale: Secondary | ICD-10-CM

## 2023-01-09 DIAGNOSIS — J441 Chronic obstructive pulmonary disease with (acute) exacerbation: Secondary | ICD-10-CM | POA: Diagnosis not present

## 2023-01-09 DIAGNOSIS — N1831 Chronic kidney disease, stage 3a: Secondary | ICD-10-CM

## 2023-01-09 DIAGNOSIS — U071 COVID-19: Secondary | ICD-10-CM | POA: Diagnosis not present

## 2023-01-09 DIAGNOSIS — R531 Weakness: Secondary | ICD-10-CM

## 2023-01-09 DIAGNOSIS — J1282 Pneumonia due to coronavirus disease 2019: Secondary | ICD-10-CM

## 2023-01-09 LAB — COMPREHENSIVE METABOLIC PANEL
ALT: 21 U/L (ref 0–44)
AST: 32 U/L (ref 15–41)
Albumin: 3.3 g/dL — ABNORMAL LOW (ref 3.5–5.0)
Alkaline Phosphatase: 68 U/L (ref 38–126)
Anion gap: 8 (ref 5–15)
BUN: 31 mg/dL — ABNORMAL HIGH (ref 8–23)
CO2: 25 mmol/L (ref 22–32)
Calcium: 8.6 mg/dL — ABNORMAL LOW (ref 8.9–10.3)
Chloride: 104 mmol/L (ref 98–111)
Creatinine, Ser: 0.96 mg/dL (ref 0.61–1.24)
GFR, Estimated: >60 mL/min (ref >60)
Glucose, Bld: 134 mg/dL — ABNORMAL HIGH (ref 70–99)
Potassium: 4.5 mmol/L (ref 3.5–5.1)
Sodium: 137 mmol/L (ref 135–145)
Total Bilirubin: 0.5 mg/dL (ref 0.3–1.2)
Total Protein: 6 g/dL — ABNORMAL LOW (ref 6.5–8.1)

## 2023-01-09 LAB — CBC
HCT: 38.3 % — ABNORMAL LOW (ref 39.0–52.0)
Hemoglobin: 12.9 g/dL — ABNORMAL LOW (ref 13.0–17.0)
MCH: 30.9 pg (ref 26.0–34.0)
MCHC: 33.7 g/dL (ref 30.0–36.0)
MCV: 91.8 fL (ref 80.0–100.0)
Platelets: 180 10*3/uL (ref 150–400)
RBC: 4.17 MIL/uL — ABNORMAL LOW (ref 4.22–5.81)
RDW: 13.3 % (ref 11.5–15.5)
WBC: 10 10*3/uL (ref 4.0–10.5)
nRBC: 0 % (ref 0.0–0.2)

## 2023-01-09 LAB — C-REACTIVE PROTEIN: CRP: 13.5 mg/dL — ABNORMAL HIGH (ref <1.0)

## 2023-01-09 MED ORDER — PREDNISONE 20 MG PO TABS: 40.0000 mg | ORAL_TABLET | Freq: Every day | ORAL | 0 refills | Status: AC

## 2023-01-09 MED ORDER — ASCORBIC ACID 500 MG PO TABS: 500.0000 mg | ORAL_TABLET | Freq: Every day | ORAL | 0 refills | Status: AC

## 2023-01-09 MED ORDER — BENZONATATE 200 MG PO CAPS: 200.0000 mg | ORAL_CAPSULE | Freq: Three times a day (TID) | ORAL | 0 refills | Status: DC | PRN

## 2023-01-09 MED ORDER — GUAIFENESIN ER 600 MG PO TB12: 600.0000 mg | ORAL_TABLET | Freq: Two times a day (BID) | ORAL | 0 refills | Status: DC | PRN

## 2023-01-09 MED ORDER — ZINC 220 (50 ZN) MG PO CAPS: ORAL_CAPSULE | ORAL | 0 refills | Status: DC

## 2023-01-09 MED ORDER — IPRATROPIUM-ALBUTEROL 20-100 MCG/ACT IN AERS: 1.0000 | INHALATION_SPRAY | Freq: Four times a day (QID) | RESPIRATORY_TRACT | 0 refills | Status: DC | PRN

## 2023-01-09 MED ORDER — ADULT MULTIVITAMIN W/MINERALS CH: 1.0000 | ORAL_TABLET | Freq: Every day | ORAL | 0 refills | Status: DC

## 2023-01-09 NOTE — TOC CM/SW Note (Addendum)
CSW spoke with MD regarding HH recommendations, patient was active with [facility] previously. I left a message for Maralyn Sago to confirm if patient is active with them.  11:59AM: Received a CB from [place] she advised he was active in the past, but they are more than happy to get him reactivated with orders, advised orders have been put in.

## 2023-01-09 NOTE — Discharge Summary (Signed)
Physician Discharge Summary   Patient: Joseph Osborn MRN: [id number] DOB: March 09, 1926  Admit date:     01/07/2023  Discharge date: 01/09/23  Discharge Physician: Arnetha Courser   PCP: Eustaquio Boyden, MD   Recommendations at discharge:  Please obtain CBC and CMP in 1 week Follow-up with primary care provider  Discharge Diagnoses: Principal Problem:   Pneumonia due to COVID-19 virus Active Problems:   COVID-19   COPD with acute exacerbation [facility])   Pulmonary embolism and infarction (HCC)   CKD (chronic kidney disease), stage III (HCC)   CAD (coronary artery disease)   Hospital Course: Taken from H&P.   Joseph Osborn Joseph Osborn is a 89 y.o. male with medical history significant of recent PE on Eliquis, COPD, hypertension, CAD c/b MI (? 2000), CKD stage IIIa, hypothyroidism, chronic venous insufficiency who presents to the ED due to generalized weakness.   [name] had his flu shot approximately 3 days ago and then last he developed nausea and vomiting with emesis that was nonbloody, nonbilious but noted to be brown in color with coffee grounds.  he has been experiencing a productive cough with fever, generalized weakness and malaise.  On presentation he was hemodynamically stable, 94% on room air, mildly tachypneic, labs with mild hide bone natremia at 133, creatinine 1.16, COVID-19 PCR positive. Chest x-ray with no acute abnormality.  9/1: Vital stable, preliminary blood cultures negative, hyponatremia resolved.  Patient was started on remdesivir by admitting provider-will complete a 3-day course.  9/2: Patient remained stable, completed 3 doses of remdesivir.  Preliminary blood cultures remain negative.  On room air.  Multiple family members at home with similar illness.  Patient is being discharged with 3 more days of prednisone and supportive care,  Patient will continue on current medications and need to have a close follow-up with his providers further  recommendations.         Assessment and Plan: * Pneumonia due to COVID-19 virus Patient is presenting with 2-day history of generalized malaise, weakness, fever found to be COVID-19 positive.  No new opacities seen on chest x-ray. - Continuous pulse oximetry - Continue supplemental oxygen to maintain oxygen saturation above 88% - Given moderate symptoms with need for supplemental oxygen, will order remdesivir per [facility] guidelines-we will complete a 3-day course - Zofran as needed for send - Tessalon Perles and Mucinex as needed for cough  COPD with acute exacerbation (HCC) In the setting of COVID-19.  - S/p Solu-Medrol 125 mg once - Start prednisone 40 mg  to complete a 5-day course - DuoNebs every 6 hours - Continue home bronchodilators  Pulmonary embolism and infarction [facility]) Recent admission for PE complicated by pulmonary infarcts.  - Continue home Xarelto  CKD (chronic kidney disease), stage III (HCC) Renal function currently at baseline.  - Repeat BMP in the a.m.  CAD (coronary artery disease) No chest pain reported at this time.   Consultants: None Procedures performed: None Disposition: Home Diet recommendation:  Discharge Diet Orders (From admission, onward)     Start     Ordered   01/09/23 0000  Diet - low sodium heart healthy        01/09/23 1014           Cardiac diet DISCHARGE MEDICATION: Allergies as of 01/09/2023       Reactions   Shellfish Allergy Anaphylaxis   Oxybutynin Other (See Comments)   Doesn't remember reaction   Statins Other (See Comments)   Statin intolerance per cardiology  notes        Medication List     TAKE these medications    acetaminophen 500 MG tablet Commonly known as: TYLENOL Take 2 tablets (1,000 mg total) by mouth in the morning, at noon, and at bedtime.   ascorbic acid 500 MG tablet Commonly known as: VITAMIN C Take 1 tablet (500 mg total) by mouth daily.   benzonatate 200 MG capsule Commonly  known as: TESSALON Take 1 capsule (200 mg total) by mouth 3 (three) times daily as needed for cough.   guaiFENesin 600 MG 12 hr tablet Commonly known as: MUCINEX Take 1 tablet (600 mg total) by mouth 2 (two) times daily as needed for cough or to loosen phlegm.   Ipratropium-Albuterol 20-100 MCG/ACT Aers respimat Commonly known as: COMBIVENT Inhale 1 puff into the lungs every 6 (six) hours as needed for wheezing or shortness of breath.   linaclotide 72 MCG capsule Commonly known as: LINZESS Take 1 capsule (72 mcg total) by mouth daily before breakfast.   multivitamin with minerals Tabs tablet Take 1 tablet by mouth daily.   polyethylene glycol powder 17 GM/SCOOP powder Commonly known as: GLYCOLAX/MIRALAX Take 17 g by mouth daily as needed for moderate constipation.   predniSONE 20 MG tablet Commonly known as: DELTASONE Take 2 tablets (40 mg total) by mouth daily with breakfast for 3 days.   rivaroxaban 20 MG Tabs tablet Commonly known as: XARELTO Take 1 tablet (20 mg total) by mouth daily with supper.   Vitamin D3 25 MCG (1000 UT) Caps Take 1 capsule (1,000 Units total) by mouth daily.   Zinc 220 (50 Zn) MG Caps 1 tablet daily        Follow-up Information     Eustaquio Boyden, MD. Schedule an appointment as soon as possible for a visit in 1 week(s).   Specialty: Family Medicine Contact information: [address] [place] [address] [phone]                Discharge Exam: Joseph Osborn Weights   01/07/23 1053  Weight: 77.8 kg   General. Frail elderly man, in no acute distress. Pulmonary.  Lungs clear bilaterally, normal respiratory effort. CV.  Regular rate and rhythm, no JVD, rub or murmur. Abdomen.  Soft, nontender, nondistended, BS positive. CNS.  Alert and oriented .  No focal neurologic deficit. Extremities.  No edema, no cyanosis, pulses intact and symmetrical. Psychiatry.  Judgment and insight appears normal.   Condition at discharge:  stable  The results of significant diagnostics from this hospitalization (including imaging, microbiology, ancillary and laboratory) are listed below for reference.   Imaging Studies: DG Chest Port 1 View  Result Date: 01/07/2023 CLINICAL DATA:  Weakness with nausea, vomiting and fever since yesterday. Possible sepsis. EXAM: PORTABLE CHEST 1 VIEW COMPARISON:  Chest CTA 12/07/2022. Abdominal CT 12/29/2022. radiographs 12/07/2022 and 02/21/2022. FINDINGS: 1133 hours. The overall pulmonary shin has mildly improved compared with the most recent radiographs. Stable cardiomegaly and aortic atherosclerosis. Patchy left greater than right basilar pulmonary opacities are similar to recent prior studies and may reflect atelectasis or scarring related to previous pulmonary embolism. No progressive airspace disease, edema, significant pleural effusion or pneumothorax demonstrated. The bones appear unchanged.  Telemetry leads overlie the chest. IMPRESSION: 1. Mildly improved pulmonary aeration compared with most recent radiographs. No progressive airspace disease or edema. 2. Stable cardiomegaly and chronic bibasilar pulmonary opacities. Electronically Signed   By: Carey Bullocks M.D.   On: 01/07/2023 11:54   CT  Angio Abd/Pel W and/or Wo Contrast  Result Date: 12/29/2022 CLINICAL DATA:  Bright red blood per rectum. EXAM: CTA ABDOMEN AND PELVIS WITHOUT AND WITH CONTRAST TECHNIQUE: Multidetector CT imaging of the abdomen and pelvis was performed using the standard protocol during bolus administration of intravenous contrast. Multiplanar reconstructed images and MIPs were obtained and reviewed to evaluate the vascular anatomy. RADIATION DOSE REDUCTION: This exam was performed according to the departmental dose-optimization program which includes automated exposure control, adjustment of the mA and/or kV according to patient size and/or use of iterative reconstruction technique. CONTRAST:  OMNIPAQUE IOHEXOL 350  MG/ML SOLN COMPARISON:  CT abdomen and pelvis 06/08/2022 FINDINGS: VASCULAR Aorta: Normal caliber aorta without aneurysm, dissection, vasculitis or significant stenosis. There is mild calcified atherosclerotic disease throughout the aorta. Celiac: Patent without evidence of aneurysm, dissection, vasculitis or significant stenosis. SMA: Patent without evidence of aneurysm, dissection, vasculitis or significant stenosis. Renals: Both renal arteries are patent without evidence of aneurysm, dissection, vasculitis, fibromuscular dysplasia or significant stenosis. IMA: Patent without evidence of aneurysm, dissection, vasculitis or significant stenosis. Inflow: Patent without evidence of aneurysm, dissection, vasculitis or significant stenosis. Proximal Outflow: Bilateral common femoral and visualized portions of the superficial and profunda femoral arteries are patent without evidence of aneurysm, dissection, vasculitis or significant stenosis. Veins: No obvious venous abnormality within the limitations of this arterial phase study. Review of the MIP images confirms the above findings. NON-VASCULAR Lower chest: There is atelectasis in the left lung base. There is mild elevation of the left hemidiaphragm, unchanged. Hepatobiliary: No focal liver abnormality is seen. Status post cholecystectomy. No biliary dilatation. Pancreas: Unremarkable. No pancreatic ductal dilatation or surrounding inflammatory changes. Spleen: Normal in size without focal abnormality. Adrenals/Urinary Tract: There is mild stranding surrounding the bladder. Again seen is moderate left-sided hydroureteronephrosis with abrupt caliber change in the distal left ureter. This is unchanged from prior. Left renal cyst measures 11 mm similar to the prior study. The right kidney and adrenal glands are within normal limits. Stomach/Bowel: Appendix appears normal. No evidence of bowel wall thickening, distention, or inflammatory changes. There is no active  gastrointestinal bleeding identified. There scattered colonic diverticula. Small hiatal hernia is present. The stomach is otherwise within normal limits. Lymphatic: Enlarged lymph nodes are seen. Reproductive: The prostate gland is enlarged, unchanged. Other: There are small fat containing ventral hernias in the upper abdomen similar to the prior study. There also small fat containing inguinal and umbilical hernias. There is no ascites. Musculoskeletal: Degenerative changes affect the spine. IMPRESSION: VASCULAR 1. No evidence for active gastrointestinal bleeding. Aortic Atherosclerosis (ICD10-I70.0). NON-VASCULAR 1. No acute localizing process in the abdomen or pelvis. 2. Colonic diverticulosis. 3. Stable moderate left-sided hydroureteronephrosis with abrupt caliber change in the distal left ureter. Findings may be related to stricture or neoplasm. 4. Stable prostatomegaly. Electronically Signed   By: Darliss Cheney M.D.   On: 12/29/2022 16:12    Microbiology: Results for orders placed or performed during the hospital encounter of 01/07/23  Blood Culture (routine x 2)     Status: None (Preliminary result)   Collection Time: 01/07/23 11:10 AM   Specimen: BLOOD  Result Value Ref Range Status   Specimen Description BLOOD LEFT ANTECUBITAL  Final   Special Requests   Final    BOTTLES DRAWN AEROBIC AND ANAEROBIC Blood Culture results may not be optimal due to an inadequate volume of blood received in culture bottles   Culture   Final    NO GROWTH 2 DAYS Performed at [facility], [address], [place], [place] [address]    Report Status PENDING  Incomplete  Blood Culture (routine x 2)     Status: None (Preliminary result)   Collection Time: 01/07/23 11:28 AM   Specimen: BLOOD  Result Value Ref Range Status   Specimen Description BLOOD RIGHT ANTECUBITAL  Final   Special Requests   Final    BOTTLES DRAWN AEROBIC AND ANAEROBIC Blood Culture adequate volume   Culture   Final    NO GROWTH 2  DAYS Performed at [facility], [address] [place], [place] [address]    Report Status PENDING  Incomplete  SARS Coronavirus 2 by RT PCR (hospital order, performed in [facility] hospital lab) *cepheid single result test* Anterior Nasal Swab     Status: Abnormal   Collection Time: 01/07/23 12:04 PM   Specimen: Anterior Nasal Swab  Result Value Ref Range Status   SARS Coronavirus 2 by RT PCR POSITIVE (A) NEGATIVE Final    Comment: (NOTE) SARS-CoV-2 target nucleic acids are DETECTED  SARS-CoV-2 RNA is generally detectable in upper respiratory specimens  during the acute phase of infection.  Positive results are indicative  of the presence of the identified virus, but do not rule out bacterial infection or co-infection with other pathogens not detected by the test.  Clinical correlation with patient history and  other diagnostic information is necessary to determine patient infection status.  The expected result is negative.  Fact Sheet for Patients:   RoadLapTop.co.za   Fact Sheet for Healthcare Providers:   http://kim-miller.com/    This test is not yet approved or cleared by the [place] FDA and  has been authorized for detection and/or diagnosis of SARS-CoV-2 by FDA under an Emergency Use Authorization (EUA).  This EUA will remain in effect (meaning this test can be used) for the duration of  the COVID-19 declaration under Section 564(b)(1)  of the Act, 21 U.S.C. section 360-bbb-3(b)(1), unless the authorization is terminated or revoked sooner.   Performed at [facility], [address] [place], [place] [address]     Labs: CBC: Recent Labs  Lab 01/07/23 1058 01/08/23 0527 01/09/23 0324  WBC 7.9 9.8 10.0  NEUTROABS 6.0  --   --   HGB 14.7 13.6 12.9*  HCT 44.0 40.5 38.3*  MCV 91.7 91.6 91.8  PLT 168 153 180   Basic Metabolic Panel: Recent Labs  Lab 01/07/23 1058 01/08/23 0527  01/09/23 0324  NA 133* 136 137  K 3.8 3.8 4.5  CL 98 104 104  CO2 22 23 25   GLUCOSE 125* 134* 134*  BUN 16 19 31*  CREATININE 1.16 0.99 0.96  CALCIUM 8.9 8.6* 8.6*   Liver Function Tests: Recent Labs  Lab 01/07/23 1058 01/09/23 0324  AST 39 32  ALT 29 21  ALKPHOS 89 68  BILITOT 1.9* 0.5  PROT 7.9 6.0*  ALBUMIN 4.1 3.3*   CBG: No results for input(s): "GLUCAP" in the last 168 hours.  Discharge time spent: greater than 30 minutes.  This record has been created using Conservation officer, historic buildings. Errors have been sought and corrected,but may not always be located. Such creation errors do not reflect on the standard of care.   Signed: Arnetha Courser, MD [facility] 01/09/2023

## 2023-01-09 NOTE — Plan of Care (Signed)

## 2023-01-09 NOTE — Evaluation (Signed)
Occupational Therapy Evaluation Patient Details Name: Joseph Osborn MRN: [id number] DOB: August 11, 1925 Today's Date: 01/09/2023   History of Present Illness Patient is a 89 year old male who presented to ED for generalized weakness admitted for Pneumonia . Patient had a flu shot 3 days ago, developed nausea and vomiting with emesis, fever, and productive cough. PMH includes recent PE on eliquis, COPD, HTN, CAD c/b MI, CKD stage IIIa, hypothyroidism,DVTs, and chronic venous insufficiency.   Clinical Impression   Pt is a very pleasant 89 year old who presents with generalized weakness and limited endurance. Pt initially on 2L O2, with O2 sats at 95%. After ambulating in room with RW and without [place], pt's O2 sats drop to 86%, however quickly return to low-mid 90s with seated rest break and slow, deep breaths. Pt's balance is good in both sitting and standing, no LOB with ambulation. Patient's daughter is in room and assists with history and interpreting. Prior to hospital admission, pt was independent with assistive device and lives at home with his wife and daughter.  He is currently getting home health PT twice a week, per daughter. Pt, family, and therapist in agreement that this is sufficient; pt appears to be at or near his baseline level of fxl mobility for grooming, dressing, toileting, bathing -- no OT needs at this time.     If plan is discharge home, recommend the following: A little help with walking and/or transfers;A little help with bathing/dressing/bathroom;Assistance with cooking/housework;Assist for transportation    Functional Status Assessment  Patient has had a recent decline in their functional status and demonstrates the ability to make significant improvements in function in a reasonable and predictable amount of time.  Equipment Recommendations  None recommended by OT    Recommendations for Other Services       Precautions / Restrictions Precautions Precautions:  Fall Precaution Comments: 3 falls last month Restrictions Weight Bearing Restrictions: No      Mobility Bed Mobility Overal bed mobility: Modified Independent             General bed mobility comments: Increased time to get into/out of bed.    Transfers Overall transfer level: Needs assistance Equipment used: Rolling walker (2 wheels) Transfers: Sit to/from Stand Sit to Stand: Modified independent (Device/Increase time)                  Balance Overall balance assessment: Needs assistance Sitting-balance support: Feet unsupported Sitting balance-Leahy Scale: Good     Standing balance support: Bilateral upper extremity supported, During functional activity Standing balance-Leahy Scale: Good Standing balance comment: requires use of RW for ambulation                           ADL either performed or assessed with clinical judgement   ADL Overall ADL's : Needs assistance/impaired                                       General ADL Comments: SUPV for ADL 2/2 weakness     Vision         Perception         Praxis         Pertinent Vitals/Pain Pain Assessment Pain Assessment: No/denies pain     Extremity/Trunk Assessment Upper Extremity Assessment Upper Extremity Assessment: Overall WFL for tasks assessed   Lower Extremity Assessment Lower Extremity  Assessment: Overall WFL for tasks assessed   Cervical / Trunk Assessment Cervical / Trunk Assessment: Normal   Communication Communication Communication: No apparent difficulties   Cognition Arousal: Alert Behavior During Therapy: WFL for tasks assessed/performed Overall Cognitive Status: Within Functional Limits for tasks assessed                                 General Comments: Daughter assists with interpretation.     General Comments       Exercises Other Exercises Other Exercises: Educ re: rehab process, energy conservation strategies, taking  deep slow breaths   Shoulder Instructions      Home Living Family/patient expects to be discharged to:: Private residence Living Arrangements: Spouse/significant other;Children Available Help at Discharge: Family;Available 24 hours/day Type of Home: House             Bathroom Shower/Tub: Walk-in Soil scientist Toilet: Standard     Home Equipment: Agricultural consultant (2 wheels);Rollator (4 wheels);Shower seat - built in;Shower seat   Additional Comments: Has a RW, rollator for home use. Lives with daughter and wife.      Prior Functioning/Environment Prior Level of Function : Independent/Modified Independent;History of Falls (last six months)             Mobility Comments: Patient is ambulatory at home with a RW. Has PT 2x/week at home. Did have 3 falls in past 6 months but has not had any in past month. ADLs Comments: Patient ind with ADLs and medication management        OT Problem List: Decreased strength;Decreased activity tolerance;Impaired balance (sitting and/or standing);Cardiopulmonary status limiting activity      OT Treatment/Interventions:      OT Goals(Current goals can be found in the care plan section) Acute Rehab OT Goals Patient Stated Goal: to return home and get back to walking outdoors, doing PT OT Goal Formulation: With patient Time For Goal Achievement: 01/23/23 Potential to Achieve Goals: Good  OT Frequency:      Co-evaluation              AM-PAC OT "6 Clicks" Daily Activity     Outcome Measure Help from another person eating meals?: None Help from another person taking care of personal grooming?: A Little Help from another person toileting, which includes using toliet, bedpan, or urinal?: A Little Help from another person bathing (including washing, rinsing, drying)?: A Little Help from another person to put on and taking off regular upper body clothing?: None Help from another person to put on and taking off regular lower body  clothing?: A Little 6 Click Score: 20   End of Session Equipment Utilized During Treatment: Rolling walker (2 wheels);Oxygen  Activity Tolerance: Patient tolerated treatment well Patient left: in bed;with call bell/phone within reach;with family/visitor present  OT Visit Diagnosis: Unsteadiness on feet (R26.81);Muscle weakness (generalized) (M62.81);History of falling (Z91.81)                Time: 1610-9604 OT Time Calculation (min): 19 min Charges:  OT General Charges $OT Visit: 1 Visit OT Evaluation $OT Eval Low Complexity: 1 Low OT Treatments $Self Care/Home Management : 8-22 mins Latina Craver, PhD, MS, OTR/L 01/09/23, 1:13 PM

## 2023-01-09 NOTE — Progress Notes (Signed)
Home health order which is being placed after patient is discharged.

## 2023-01-10 ENCOUNTER — Telehealth: Payer: Self-pay | Admitting: Family Medicine

## 2023-01-10 ENCOUNTER — Telehealth: Payer: Self-pay

## 2023-01-10 NOTE — Telephone Encounter (Signed)
Agree with HH orders. Thanks.

## 2023-01-10 NOTE — Telephone Encounter (Signed)
Addressed in another phone note.  No further action needed at this time.

## 2023-01-10 NOTE — Telephone Encounter (Signed)
Spoke with pt's daughter, [name] (on dpr), asking for update on pt. Says he actually doing a lot better. Scheduled ER f/u on 01/25/23 at 11:30.

## 2023-01-10 NOTE — Telephone Encounter (Signed)
Home Health verbal orders Caller Name:Dee Agency Name: [facility]  Callback number:   Requesting OT/PT/Skilled nursing/Social Work/Speech: PT  Reason: Resume of care  Frequency: Evaluation due to patient being in hospital  Please forward to [facility] pool or providers CMA

## 2023-01-10 NOTE — Telephone Encounter (Signed)
Orders given to [facility]. Will call if any questions.

## 2023-01-10 NOTE — Telephone Encounter (Signed)
Patient was seen in ED on 01/07/23 for Covid

## 2023-01-10 NOTE — Telephone Encounter (Signed)
Transition Care Management Unsuccessful Follow-up Telephone Call  Date of discharge and from where:  [facility] 8/22  Attempts:  1st Attempt  Reason for unsuccessful TCM follow-up call:  No answer/busy   Lenard Forth [place]  [facility], [facility] Guide, Phone: [phone] Website: [email or web address]

## 2023-01-10 NOTE — Telephone Encounter (Addendum)
Seen at ER over weekend with COVID infection Discharged yesterday.  Please call for update on status and offer hospital f/u visit with-in the next 2 wks.

## 2023-01-10 NOTE — Telephone Encounter (Signed)
Transition Care Management Unsuccessful Follow-up Telephone Call  Date of discharge and from where:  [place] 8/22  Attempts:  2nd Attempt  Reason for unsuccessful TCM follow-up call:  No answer/busy   Lenard Forth [place]  [facility], [facility] Guide, Phone: [phone] Website: [email or web address]

## 2023-01-12 LAB — CULTURE, BLOOD (ROUTINE X 2)
Culture: NO GROWTH
Culture: NO GROWTH
Special Requests: ADEQUATE

## 2023-01-25 ENCOUNTER — Ambulatory Visit (INDEPENDENT_AMBULATORY_CARE_PROVIDER_SITE_OTHER): Payer: Medicare Other | Admitting: Family Medicine

## 2023-01-25 ENCOUNTER — Encounter: Payer: Self-pay | Admitting: Family Medicine

## 2023-01-25 VITALS — BP 128/82 | HR 88 | Temp 95.1°F | Wt 166.0 lb

## 2023-01-25 DIAGNOSIS — I2699 Other pulmonary embolism without acute cor pulmonale: Secondary | ICD-10-CM | POA: Diagnosis not present

## 2023-01-25 DIAGNOSIS — R1900 Intra-abdominal and pelvic swelling, mass and lump, unspecified site: Secondary | ICD-10-CM | POA: Diagnosis not present

## 2023-01-25 DIAGNOSIS — U071 COVID-19: Secondary | ICD-10-CM | POA: Diagnosis not present

## 2023-01-25 DIAGNOSIS — J1282 Pneumonia due to coronavirus disease 2019: Secondary | ICD-10-CM

## 2023-01-25 DIAGNOSIS — E039 Hypothyroidism, unspecified: Secondary | ICD-10-CM | POA: Diagnosis not present

## 2023-01-25 DIAGNOSIS — H5712 Ocular pain, left eye: Secondary | ICD-10-CM

## 2023-01-25 LAB — CBC WITH DIFFERENTIAL/PLATELET
Basophils Absolute: 0.1 10*3/uL (ref 0.0–0.1)
Basophils Relative: 1.3 % (ref 0.0–3.0)
Eosinophils Absolute: 0.2 10*3/uL (ref 0.0–0.7)
Eosinophils Relative: 3.7 % (ref 0.0–5.0)
HCT: 43.8 % (ref 39.0–52.0)
Hemoglobin: 14.1 g/dL (ref 13.0–17.0)
Lymphocytes Relative: 36.1 % (ref 12.0–46.0)
Lymphs Abs: 1.6 10*3/uL (ref 0.7–4.0)
MCHC: 32.3 g/dL (ref 30.0–36.0)
MCV: 93.9 fl (ref 78.0–100.0)
Monocytes Absolute: 0.4 10*3/uL (ref 0.1–1.0)
Monocytes Relative: 9.9 % (ref 3.0–12.0)
Neutro Abs: 2.1 10*3/uL (ref 1.4–7.7)
Neutrophils Relative %: 49 % (ref 43.0–77.0)
Platelets: 212 10*3/uL (ref 150.0–400.0)
RBC: 4.66 Mil/uL (ref 4.22–5.81)
RDW: 14.5 % (ref 11.5–15.5)
WBC: 4.3 10*3/uL (ref 4.0–10.5)

## 2023-01-25 LAB — COMPREHENSIVE METABOLIC PANEL WITH GFR
ALT: 21 U/L (ref 0–53)
AST: 20 U/L (ref 0–37)
Albumin: 3.9 g/dL (ref 3.5–5.2)
Alkaline Phosphatase: 99 U/L (ref 39–117)
BUN: 16 mg/dL (ref 6–23)
CO2: 30 meq/L (ref 19–32)
Calcium: 9.2 mg/dL (ref 8.4–10.5)
Chloride: 104 meq/L (ref 96–112)
Creatinine, Ser: 1.07 mg/dL (ref 0.40–1.50)
GFR: 58.97 mL/min — ABNORMAL LOW (ref >60.00)
Glucose, Bld: 88 mg/dL (ref 70–99)
Potassium: 5.2 meq/L — ABNORMAL HIGH (ref 3.5–5.1)
Sodium: 140 meq/L (ref 135–145)
Total Bilirubin: 0.9 mg/dL (ref 0.2–1.2)
Total Protein: 6.5 g/dL (ref 6.0–8.3)

## 2023-01-25 LAB — T4, FREE: Free T4: 1 ng/dL (ref 0.60–1.60)

## 2023-01-25 LAB — TSH: TSH: 6.81 u[IU]/mL — ABNORMAL HIGH (ref 0.35–5.50)

## 2023-01-25 MED ORDER — POLYMYXIN B-TRIMETHOPRIM 10000-0.1 UNIT/ML-% OP SOLN: 1.0000 [drp] | Freq: Four times a day (QID) | OPHTHALMIC | 0 refills | Status: DC

## 2023-01-25 NOTE — Progress Notes (Unsigned)
Ph: [phone] Fax: [phone]   Patient ID: Joseph Osborn, male    DOB: 1926/04/20, 89 y.o.   MRN: [id number]  This visit was conducted in person.  BP 128/82   Pulse 88   Temp (!) 95.1 F (35.1 C)   Wt 166 lb (75.3 kg)   SpO2 97%   BMI 28.49 kg/m    CC: hosp f/u visit  Subjective:   HPI: Joseph Osborn is a 89 y.o. male presenting on 01/25/2023 for Follow-up (ED f/u. Pt seen due to being "very sick"; severe fatigue, nausea, vomiting, fever. Was prescribed vitamins and Mucinex )   Recent hospitalization 01/07/2023 for COVID pneumonia. Presented with nausea/vomiting, productive cough, fever, weakness and malaise. Treated with remdesivir 3d course, prednisone course as well. Initial treatment with solumedrol and duonebs. Blcx no growth x2 (final).  Hospital records reviewed. Med rec performed.  H/o PE - continues xarelto Entire family developed COVID.   Notes left eye pain for several days. Managed with lubricating eye drops. Notes blurry vision to left. H/o left eye cataract surgery. H/o chronic right eye diminished sensation.   Notes R flank pain present for several weeks, worse with prolonged sitting/leaning on this area.   Home health not set up.  Other follow up appointments scheduled: none ______________________________________________________________________ Hospital admission: 01/07/2023 Hospital discharge: 01/09/2023 TCM f/u phone call: not performed   Recommendations at discharge:  Please obtain CBC and CMP in 1 week Follow-up with primary care provider   Discharge Diagnoses: Principal Problem:   Pneumonia due to COVID-19 virus Active Problems:   COVID-19   COPD with acute exacerbation (HCC)   Pulmonary embolism and infarction (HCC)   CKD (chronic kidney disease), stage III (HCC)   CAD (coronary artery disease)     Relevant past medical, surgical, family and social history reviewed and updated as indicated. Interim medical history  since our last visit reviewed. Allergies and medications reviewed and updated. Outpatient Medications Prior to Visit  Medication Sig Dispense Refill   acetaminophen (TYLENOL) 500 MG tablet Take 2 tablets (1,000 mg total) by mouth in the morning, at noon, and at bedtime.     Cholecalciferol (VITAMIN D3) 25 MCG (1000 UT) capsule Take 1 capsule (1,000 Units total) by mouth daily. 30 capsule    guaiFENesin (MUCINEX) 600 MG 12 hr tablet Take 1 tablet (600 mg total) by mouth 2 (two) times daily as needed for cough or to loosen phlegm. 30 tablet 0   Ipratropium-Albuterol (COMBIVENT) 20-100 MCG/ACT AERS respimat Inhale 1 puff into the lungs every 6 (six) hours as needed for wheezing or shortness of breath. 4 g 0   linaclotide (LINZESS) 72 MCG capsule Take 1 capsule (72 mcg total) by mouth daily before breakfast. 90 capsule 3   Multiple Vitamin (MULTIVITAMIN WITH MINERALS) TABS tablet Take 1 tablet by mouth daily. 90 tablet 0   polyethylene glycol powder (GLYCOLAX/MIRALAX) 17 GM/SCOOP powder Take 17 g by mouth daily as needed for moderate constipation.     rivaroxaban (XARELTO) 20 MG TABS tablet Take 1 tablet (20 mg total) by mouth daily with supper. 30 tablet 11   vitamin C (VITAMIN C) 500 MG tablet Take 1 tablet (500 mg total) by mouth daily. 30 tablet 0   Zinc 220 (50 Zn) MG CAPS 1 tablet daily 30 capsule 0   benzonatate (TESSALON) 200 MG capsule Take 1 capsule (200 mg total) by mouth 3 (three) times daily as needed for cough. 20 capsule 0  No facility-administered medications prior to visit.     Per HPI unless specifically indicated in ROS section below Review of Systems  Objective:  BP 128/82   Pulse 88   Temp (!) 95.1 F (35.1 C)   Wt 166 lb (75.3 kg)   SpO2 97%   BMI 28.49 kg/m   Wt Readings from Last 3 Encounters:  01/25/23 166 lb (75.3 kg)  01/07/23 171 lb 8.3 oz (77.8 kg)  01/04/23 171 lb 9.6 oz (77.8 kg)      Physical Exam Vitals and nursing note reviewed.  Constitutional:       Appearance: Normal appearance. He is not ill-appearing.     Comments: Ambulates with cane  HENT:     Mouth/Throat:     Comments: Wearing mask Eyes:     Extraocular Movements: Extraocular movements intact.     Pupils: Pupils are equal, round, and reactive to light.  Cardiovascular:     Rate and Rhythm: Normal rate and regular rhythm.     Pulses: Normal pulses.     Heart sounds: Normal heart sounds. No murmur heard. Pulmonary:     Effort: Pulmonary effort is normal. No respiratory distress.     Breath sounds: Normal breath sounds. No wheezing, rhonchi or rales.       Comments: Tender lump to right flank at inferior border of costal margin Skin:    General: Skin is warm and dry.     Findings: No rash.  Neurological:     Mental Status: He is alert.  Psychiatric:        Mood and Affect: Mood normal.        Behavior: Behavior normal.       Results for orders placed or performed during the hospital encounter of 01/07/23  Blood Culture (routine x 2)   Specimen: BLOOD  Result Value Ref Range   Specimen Description BLOOD LEFT ANTECUBITAL    Special Requests      BOTTLES DRAWN AEROBIC AND ANAEROBIC Blood Culture results may not be optimal due to an inadequate volume of blood received in culture bottles   Culture      NO GROWTH 5 DAYS Performed at [facility], [address], [place], [place] [address]    Report Status 01/12/2023 FINAL   Blood Culture (routine x 2)   Specimen: BLOOD  Result Value Ref Range   Specimen Description BLOOD RIGHT ANTECUBITAL    Special Requests      BOTTLES DRAWN AEROBIC AND ANAEROBIC Blood Culture adequate volume   Culture      NO GROWTH 5 DAYS Performed at [facility], [address] [place], [place] [address]    Report Status 01/12/2023 FINAL   SARS Coronavirus 2 by RT PCR (hospital order, performed in [facility] hospital lab) *cepheid single result test* Anterior Nasal Swab   Specimen: Anterior Nasal Swab  Result  Value Ref Range   SARS Coronavirus 2 by RT PCR POSITIVE (A) NEGATIVE  Lactic acid, plasma  Result Value Ref Range   Lactic Acid, Venous 1.8 0.5 - 1.9 mmol/L  Comprehensive metabolic panel  Result Value Ref Range   Sodium 133 (L) 135 - 145 mmol/L   Potassium 3.8 3.5 - 5.1 mmol/L   Chloride 98 98 - 111 mmol/L   CO2 22 22 - 32 mmol/L   Glucose, Bld 125 (H) 70 - 99 mg/dL   BUN 16 8 - 23 mg/dL   Creatinine, Ser 7.56 0.61 - 1.24 mg/dL  Calcium 8.9 8.9 - 10.3 mg/dL   Total Protein 7.9 6.5 - 8.1 g/dL   Albumin 4.1 3.5 - 5.0 g/dL   AST 39 15 - 41 U/L   ALT 29 0 - 44 U/L   Alkaline Phosphatase 89 38 - 126 U/L   Total Bilirubin 1.9 (H) 0.3 - 1.2 mg/dL   GFR, Estimated 58 (L) >60 mL/min   Anion gap 13 5 - 15  CBC with Differential  Result Value Ref Range   WBC 7.9 4.0 - 10.5 K/uL   RBC 4.80 4.22 - 5.81 MIL/uL   Hemoglobin 14.7 13.0 - 17.0 g/dL   HCT 16.1 09.6 - 04.5 %   MCV 91.7 80.0 - 100.0 fL   MCH 30.6 26.0 - 34.0 pg   MCHC 33.4 30.0 - 36.0 g/dL   RDW 40.9 81.1 - 91.4 %   Platelets 168 150 - 400 K/uL   nRBC 0.0 0.0 - 0.2 %   Neutrophils Relative % 74 %   Neutro Abs 6.0 1.7 - 7.7 K/uL   Lymphocytes Relative 14 %   Lymphs Abs 1.1 0.7 - 4.0 K/uL   Monocytes Relative 9 %   Monocytes Absolute 0.7 0.1 - 1.0 K/uL   Eosinophils Relative 1 %   Eosinophils Absolute 0.1 0.0 - 0.5 K/uL   Basophils Relative 1 %   Basophils Absolute 0.0 0.0 - 0.1 K/uL   Immature Granulocytes 1 %   Abs Immature Granulocytes 0.05 0.00 - 0.07 K/uL  Protime-INR  Result Value Ref Range   Prothrombin Time 24.4 (H) 11.4 - 15.2 seconds   INR 2.2 (H) 0.8 - 1.2  APTT  Result Value Ref Range   aPTT 45 (H) 24 - 36 seconds  CBC  Result Value Ref Range   WBC 9.8 4.0 - 10.5 K/uL   RBC 4.42 4.22 - 5.81 MIL/uL   Hemoglobin 13.6 13.0 - 17.0 g/dL   HCT 78.2 95.6 - 21.3 %   MCV 91.6 80.0 - 100.0 fL   MCH 30.8 26.0 - 34.0 pg   MCHC 33.6 30.0 - 36.0 g/dL   RDW 08.6 57.8 - 46.9 %   Platelets 153 150 - 400 K/uL    nRBC 0.0 0.0 - 0.2 %  Basic metabolic panel  Result Value Ref Range   Sodium 136 135 - 145 mmol/L   Potassium 3.8 3.5 - 5.1 mmol/L   Chloride 104 98 - 111 mmol/L   CO2 23 22 - 32 mmol/L   Glucose, Bld 134 (H) 70 - 99 mg/dL   BUN 19 8 - 23 mg/dL   Creatinine, Ser 6.29 0.61 - 1.24 mg/dL   Calcium 8.6 (L) 8.9 - 10.3 mg/dL   GFR, Estimated >52 >84 mL/min   Anion gap 9 5 - 15  Comprehensive metabolic panel  Result Value Ref Range   Sodium 137 135 - 145 mmol/L   Potassium 4.5 3.5 - 5.1 mmol/L   Chloride 104 98 - 111 mmol/L   CO2 25 22 - 32 mmol/L   Glucose, Bld 134 (H) 70 - 99 mg/dL   BUN 31 (H) 8 - 23 mg/dL   Creatinine, Ser 1.32 0.61 - 1.24 mg/dL   Calcium 8.6 (L) 8.9 - 10.3 mg/dL   Total Protein 6.0 (L) 6.5 - 8.1 g/dL   Albumin 3.3 (L) 3.5 - 5.0 g/dL   AST 32 15 - 41 U/L   ALT 21 0 - 44 U/L   Alkaline Phosphatase 68 38 - 126 U/L  Total Bilirubin 0.5 0.3 - 1.2 mg/dL   GFR, Estimated >30 >86 mL/min   Anion gap 8 5 - 15  CBC  Result Value Ref Range   WBC 10.0 4.0 - 10.5 K/uL   RBC 4.17 (L) 4.22 - 5.81 MIL/uL   Hemoglobin 12.9 (L) 13.0 - 17.0 g/dL   HCT 57.8 (L) 46.9 - 62.9 %   MCV 91.8 80.0 - 100.0 fL   MCH 30.9 26.0 - 34.0 pg   MCHC 33.7 30.0 - 36.0 g/dL   RDW 52.8 41.3 - 24.4 %   Platelets 180 150 - 400 K/uL   nRBC 0.0 0.0 - 0.2 %  C-reactive protein  Result Value Ref Range   CRP 13.5 (H) <1.0 mg/dL    Assessment & Plan:   Problem List Items Addressed This Visit     Borderline hypothyroidism   Relevant Orders   TSH   T4, free   Pulmonary embolism and infarction (HCC) - Primary   Relevant Orders   Comprehensive metabolic panel   CBC with Differential/Platelet   Right flank mass   Relevant Orders   US Abdomen Limited     Meds ordered this encounter  Medications   trimethoprim-polymyxin b (POLYTRIM) ophthalmic solution    Sig: Place 1 drop into the left eye every 6 (six) hours.    Dispense:  10 mL    Refill:  0    Orders Placed This Encounter   Procedures   US Abdomen Limited    Standing Status:   Future    Standing Expiration Date:   01/25/2024    Order Specific Question:   Reason for Exam (SYMPTOM  OR DIAGNOSIS REQUIRED)    Answer:   tender R flank mass present for 1 month    Order Specific Question:   Preferred imaging location?    Answer:   [facility]   Comprehensive metabolic panel   CBC with Differential/Platelet   TSH   T4, free    Patient Instructions  Laborator[facility] remitiremos para sonograma del lado costado derecho posterior.  Trate gotitas para dolor de hojo izquierdo Siga medicinas como [facility].  Me alegro que se este mejor de la infeccion del COVID.  Follow up plan: No follow-ups on file.  Eustaquio Boyden, MD

## 2023-01-25 NOTE — Patient Instructions (Addendum)
[facility] remitiremos para sonograma del lado costado derecho posterior.  Trate gotitas para dolor de hojo izquierdo Siga medicinas como [facility].  Me alegro que se este mejor de la infeccion del COVID.

## 2023-01-26 ENCOUNTER — Encounter: Payer: Self-pay | Admitting: Family Medicine

## 2023-01-26 DIAGNOSIS — H1010 Acute atopic conjunctivitis, unspecified eye: Secondary | ICD-10-CM | POA: Insufficient documentation

## 2023-01-26 DIAGNOSIS — H5712 Ocular pain, left eye: Secondary | ICD-10-CM | POA: Insufficient documentation

## 2023-01-26 NOTE — Assessment & Plan Note (Signed)
Present for the past month, tender swelling to R flank at lower posterior costal margin.  Recent imaging without etiology (imaging was done prior to developing this mass).  Firmer than expected for lipoma - will proceed with soft tissue ultrasound for further evaluation.

## 2023-01-26 NOTE — Assessment & Plan Note (Signed)
Recent hospitalization for this, completed treatment with prednsione taper and Remdesivir 3d course.  Symptoms significantly improved.

## 2023-01-26 NOTE — Assessment & Plan Note (Signed)
Tenderness to left eye, exam overall reassuring.  Discussed continued use of lubricating eye drops.  Will Rx polytrim eye drops, recommend eval by eye doctor if not improving with this.

## 2023-01-26 NOTE — Assessment & Plan Note (Signed)
Continue xarelto

## 2023-01-26 NOTE — Assessment & Plan Note (Signed)
Update TSH/fT4. Previous levothyroxine trial was not helpful.

## 2023-02-13 ENCOUNTER — Ambulatory Visit (INDEPENDENT_AMBULATORY_CARE_PROVIDER_SITE_OTHER): Payer: Medicare Other | Admitting: Family Medicine

## 2023-02-13 ENCOUNTER — Encounter: Payer: Self-pay | Admitting: Family Medicine

## 2023-02-13 VITALS — BP 126/76 | HR 88 | Temp 98.1°F | Ht 64.0 in | Wt 165.4 lb

## 2023-02-13 DIAGNOSIS — R1900 Intra-abdominal and pelvic swelling, mass and lump, unspecified site: Secondary | ICD-10-CM | POA: Diagnosis not present

## 2023-02-13 DIAGNOSIS — H1013 Acute atopic conjunctivitis, bilateral: Secondary | ICD-10-CM

## 2023-02-13 DIAGNOSIS — K5909 Other constipation: Secondary | ICD-10-CM | POA: Diagnosis not present

## 2023-02-13 DIAGNOSIS — J449 Chronic obstructive pulmonary disease, unspecified: Secondary | ICD-10-CM

## 2023-02-13 DIAGNOSIS — I2699 Other pulmonary embolism without acute cor pulmonale: Secondary | ICD-10-CM | POA: Diagnosis not present

## 2023-02-13 DIAGNOSIS — N1330 Unspecified hydronephrosis: Secondary | ICD-10-CM

## 2023-02-13 NOTE — Progress Notes (Signed)
Ph: [phone] Fax: [phone]   Patient ID: Joseph Osborn, male    DOB: 03-Dec-1925, 89 y.o.   MRN: [id number]  This visit was conducted in person.  BP 126/76   Pulse 88   Temp 98.1 F (36.7 C) (Oral)   Ht 5\' 4"  (1.626 m)   Wt 165 lb 6 oz (75 kg)   SpO2 97%   BMI 28.39 kg/m    CC: f/u visit  Subjective:   HPI: Joseph Osborn is a 89 y.o. male presenting on 02/13/2023 for Medical Management of Chronic Issues (Here for 6 mo f/u. Pt accompanied by daughter, [name]. )   Hospitalization 01/07/2023 for COVID pneumonia - he fully recovered from this.  H/o PE - continues xarelto. 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 New R flank mass in setting of chronic L flank pain after fall. Pending [place] - they were never contacted to get this scheduled. Notes ongoing bilateral flank pain. Mass/swelling seems resolved Continues combivent rescue inhaler PRN.  Constipation - manages with every other day linzess with benefit, no constipation.   Notes ongoing itchy eyes - saw eye doctor, recommended refresh lubricating drops and antihistamine pataday.      Relevant past medical, surgical, family and social history reviewed and updated as indicated. Interim medical history since our last visit reviewed. Allergies and medications reviewed and updated. Outpatient Medications Prior to Visit  Medication Sig Dispense Refill   acetaminophen (TYLENOL) 500 MG tablet Take 2 tablets (1,000 mg total) by mouth in the morning, at noon, and at bedtime.     Cholecalciferol (VITAMIN D3) 25 MCG (1000 UT) capsule Take 1 capsule (1,000 Units total) by mouth daily. 30 capsule    guaiFENesin (MUCINEX) 600 MG 12 hr tablet Take 1 tablet (600 mg total) by mouth 2 (two) times daily as needed for cough or to loosen phlegm. 30 tablet 0   Ipratropium-Albuterol (COMBIVENT) 20-100 MCG/ACT AERS respimat Inhale 1 puff into the lungs every 6 (six) hours as needed for wheezing or shortness of breath. 4 g 0    linaclotide (LINZESS) 72 MCG capsule Take 1 capsule (72 mcg total) by mouth daily before breakfast. 90 capsule 3   Multiple Vitamin (MULTIVITAMIN WITH MINERALS) TABS tablet Take 1 tablet by mouth daily. 90 tablet 0   polyethylene glycol powder (GLYCOLAX/MIRALAX) 17 GM/SCOOP powder Take 17 g by mouth daily as needed for moderate constipation.     rivaroxaban (XARELTO) 20 MG TABS tablet Take 1 tablet (20 mg total) by mouth daily with supper. 30 tablet 11   vitamin C (VITAMIN C) 500 MG tablet Take 1 tablet (500 mg total) by mouth daily. 30 tablet 0   Zinc 220 (50 Zn) MG CAPS 1 tablet daily 30 capsule 0   trimethoprim-polymyxin b (POLYTRIM) ophthalmic solution Place 1 drop into the left eye every 6 (six) hours. 10 mL 0   No facility-administered medications prior to visit.     Per HPI unless specifically indicated in ROS section below Review of Systems  Objective:  BP 126/76   Pulse 88   Temp 98.1 F (36.7 C) (Oral)   Ht 5\' 4"  (1.626 m)   Wt 165 lb 6 oz (75 kg)   SpO2 97%   BMI 28.39 kg/m   Wt Readings from Last 3 Encounters:  02/13/23 165 lb 6 oz (75 kg)  01/25/23 166 lb (75.3 kg)  01/07/23 171 lb 8.3 oz (77.8 kg)      Physical Exam Vitals and  nursing note reviewed.  Constitutional:      Appearance: Normal appearance. He is not ill-appearing.  HENT:     Head: Normocephalic and atraumatic.     Mouth/Throat:     Mouth: Mucous membranes are moist.     Pharynx: Oropharynx is clear. No oropharyngeal exudate or posterior oropharyngeal erythema.  Eyes:     Extraocular Movements: Extraocular movements intact.     Conjunctiva/sclera: Conjunctivae normal.     Pupils: Pupils are equal, round, and reactive to light.  Cardiovascular:     Rate and Rhythm: Normal rate and regular rhythm.     Pulses: Normal pulses.     Heart sounds: Normal heart sounds. No murmur heard. Pulmonary:     Effort: Pulmonary effort is normal. No respiratory distress.     Breath sounds: Normal breath sounds.  No wheezing, rhonchi or rales.  Abdominal:     General: Bowel sounds are normal. There is no distension.     Palpations: Abdomen is soft. There is no mass.     Tenderness: There is no abdominal tenderness. There is no guarding or rebound.     Hernia: No hernia is present.     Comments: No obvious mass/swelling to either flank  Musculoskeletal:     Right lower leg: No edema.     Left lower leg: No edema.  Skin:    General: Skin is warm and dry.     Findings: No rash.  Neurological:     Mental Status: He is alert.  Psychiatric:        Mood and Affect: Mood normal.        Behavior: Behavior normal.       Results for orders placed or performed in visit on 01/25/23  Comprehensive metabolic panel  Result Value Ref Range   Sodium 140 135 - 145 mEq/L   Potassium 5.2 No hemolysis seen (H) 3.5 - 5.1 mEq/L   Chloride 104 96 - 112 mEq/L   CO2 30 19 - 32 mEq/L   Glucose, Bld 88 70 - 99 mg/dL   BUN 16 6 - 23 mg/dL   Creatinine, Ser 5.78 0.40 - 1.50 mg/dL   Total Bilirubin 0.9 0.2 - 1.2 mg/dL   Alkaline Phosphatase 99 39 - 117 U/L   AST 20 0 - 37 U/L   ALT 21 0 - 53 U/L   Total Protein 6.5 6.0 - 8.3 g/dL   Albumin 3.9 3.5 - 5.2 g/dL   GFR 46.96 (L) >29.52 mL/min   Calcium 9.2 8.4 - 10.5 mg/dL  CBC with Differential/Platelet  Result Value Ref Range   WBC 4.3 4.0 - 10.5 K/uL   RBC 4.66 4.22 - 5.81 Mil/uL   Hemoglobin 14.1 13.0 - 17.0 g/dL   HCT 84.1 32.4 - 40.1 %   MCV 93.9 78.0 - 100.0 fl   MCHC 32.3 30.0 - 36.0 g/dL   RDW 02.7 25.3 - 66.4 %   Platelets 212.0 150.0 - 400.0 K/uL   Neutrophils Relative % 49.0 43.0 - 77.0 %   Lymphocytes Relative 36.1 12.0 - 46.0 %   Monocytes Relative 9.9 3.0 - 12.0 %   Eosinophils Relative 3.7 0.0 - 5.0 %   Basophils Relative 1.3 0.0 - 3.0 %   Neutro Abs 2.1 1.4 - 7.7 K/uL   Lymphs Abs 1.6 0.7 - 4.0 K/uL   Monocytes Absolute 0.4 0.1 - 1.0 K/uL   Eosinophils Absolute 0.2 0.0 - 0.7 K/uL   Basophils Absolute 0.1 0.0 - 0.1  K/uL  TSH  Result  Value Ref Range   TSH 6.81 (H) 0.35 - 5.50 uIU/mL  T4, free  Result Value Ref Range   Free T4 1.00 0.60 - 1.60 ng/dL    Assessment & Plan:   Problem List Items Addressed This Visit     COPD (chronic obstructive pulmonary disease) (HCC)    Didn't note benefit from Incruse Ellipta. Now on combivent inhaler PRN with benefit.       Chronic constipation    Stable period with every other day linzess. Doesn't feel miralax is helpful. Discussed fruit juice use (prune, apple, pear).       Pulmonary embolism and infarction (HCC) - Primary    Continues xarelto 20mg  daily.       Hydronephrosis, left    Appreciate urology care.       Right flank mass    Not appreciated on exam - seems resolved. Will cancel [place].       Allergic conjunctivitis    Bilateral, saw eye doctor, recommended rephresh drops and patanol antihistamine drops.         No orders of the defined types were placed in this encounter.   No orders of the defined types were placed in this encounter.   Patient Instructions  Trate pataday/patanol para alergias  Gusto verlo hoy.  Regresar en 3 meses para proxima visita. Podemos cancelar sonograma.   Follow up plan: No follow-ups on file.  Eustaquio Boyden, MD

## 2023-02-13 NOTE — Assessment & Plan Note (Signed)
Stable period with every other day linzess. Doesn't feel miralax is helpful. Discussed fruit juice use (prune, apple, pear).

## 2023-02-13 NOTE — Assessment & Plan Note (Signed)
Bilateral, saw eye doctor, recommended rephresh drops and patanol antihistamine drops.

## 2023-02-13 NOTE — Assessment & Plan Note (Signed)
Appreciate urology care.  ?

## 2023-02-13 NOTE — Assessment & Plan Note (Signed)
Not appreciated on exam - seems resolved. Will cancel [place].

## 2023-02-13 NOTE — Patient Instructions (Addendum)
Trate pataday/patanol para alergias  Gusto verlo hoy.  Regresar en 3 meses para proxima visita. Podemos cancelar sonograma.

## 2023-02-13 NOTE — Assessment & Plan Note (Signed)
Continues xarelto 20mg daily.  ?

## 2023-02-13 NOTE — Assessment & Plan Note (Signed)
Didn't note benefit from Incruse Ellipta. Now on combivent inhaler PRN with benefit.

## 2023-02-21 ENCOUNTER — Ambulatory Visit
Admission: RE | Admit: 2023-02-21 | Discharge: 2023-02-21 | Disposition: A | Discharge status: Home / Self Care | Payer: Medicare Other | Source: Ambulatory Visit | Attending: Urology | Admitting: Urology

## 2023-02-21 DIAGNOSIS — Z125 Encounter for screening for malignant neoplasm of prostate: Secondary | ICD-10-CM

## 2023-02-21 DIAGNOSIS — N401 Enlarged prostate with lower urinary tract symptoms: Secondary | ICD-10-CM | POA: Diagnosis present

## 2023-02-21 DIAGNOSIS — R3912 Poor urinary stream: Secondary | ICD-10-CM | POA: Diagnosis present

## 2023-02-21 DIAGNOSIS — N135 Crossing vessel and stricture of ureter without hydronephrosis: Secondary | ICD-10-CM | POA: Diagnosis present

## 2023-02-21 DIAGNOSIS — N131 Hydronephrosis with ureteral stricture, not elsewhere classified: Secondary | ICD-10-CM

## 2023-03-20 ENCOUNTER — Other Ambulatory Visit: Payer: Medicare Other

## 2023-03-20 DIAGNOSIS — N135 Crossing vessel and stricture of ureter without hydronephrosis: Secondary | ICD-10-CM

## 2023-03-20 DIAGNOSIS — N401 Enlarged prostate with lower urinary tract symptoms: Secondary | ICD-10-CM

## 2023-03-20 DIAGNOSIS — Z125 Encounter for screening for malignant neoplasm of prostate: Secondary | ICD-10-CM

## 2023-03-21 LAB — BASIC METABOLIC PANEL
BUN/Creatinine Ratio: 17 (ref 10–24)
BUN: 17 mg/dL (ref 10–36)
CO2: 20 mmol/L (ref 20–29)
Calcium: 9.5 mg/dL (ref 8.6–10.2)
Chloride: 104 mmol/L (ref 96–106)
Creatinine, Ser: 1.02 mg/dL (ref 0.76–1.27)
Glucose: 95 mg/dL (ref 70–99)
Potassium: 4.5 mmol/L (ref 3.5–5.2)
Sodium: 140 mmol/L (ref 134–144)
eGFR: 68 mL/min/{1.73_m2} (ref >59)

## 2023-03-21 LAB — PSA: Prostate Specific Ag, Serum: 9.1 ng/mL — ABNORMAL HIGH (ref 0.0–4.0)

## 2023-03-28 ENCOUNTER — Ambulatory Visit (INDEPENDENT_AMBULATORY_CARE_PROVIDER_SITE_OTHER): Payer: Medicare Other | Admitting: Urology

## 2023-03-28 VITALS — Ht 64.0 in | Wt 173.2 lb

## 2023-03-28 DIAGNOSIS — N131 Hydronephrosis with ureteral stricture, not elsewhere classified: Secondary | ICD-10-CM

## 2023-03-28 NOTE — Progress Notes (Signed)
I,[name],acting as a scribe for Joseph Scotland, MD.,have documented all relevant documentation on the behalf of Joseph Scotland, MD,as directed by  Joseph Scotland, MD while in the presence of Joseph Scotland, MD.  03/28/2023 12:40 PM   Joseph Osborn March 11, 1926 [id number]  Referring provider: Eustaquio Boyden, MD [address] [place],  [place] [address]  Chief Complaint  Patient presents with   Hydronephrosis    HPI: 89 year-old male who returns today for follow-up of incidental left moderate to sever hydronephrosis.  He was initially seen and evaluated three months ago after admission for a submassive P.E. requiring thrombectomy. At the time he had a scan indicating hydroureteronephrosis down to the level of the bladder of unclear etiology which is new since his previous scan. His creatinine was normal. He was asymptomatic from this.  We had a lengthy goals of care discussion with he and his family. Ultimately we decided for somewhat conservative management with renal ultrasound, labs, and urine cytology. His urine cytology was negative. His PSA is 9.1 on 03/20/2023.  His creatinine remains within the normal range. 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 The degree's unchanged from his CT scan in the end of July.   PMH: Past Medical History:  Diagnosis Date   Arthritis    Basal cell carcinoma 02/04/2021   L groin, excised 03/23/21   BPH (benign prostatic hyperplasia)    CAD (coronary artery disease)    Chronic kidney disease    Stage III   Community acquired pneumonia 04/21/2021   Post COVID CAP s/p hospitalization   COPD (chronic obstructive pulmonary disease) (HCC)    COVID-19 04/13/2021   DDD (degenerative disc disease), cervical    DVT (deep venous thrombosis) (HCC) 05/30/2022   Left leg   History of deep vein thrombosis (DVT) of lower extremity 05/30/2022   Venous US 05/30/2022:  nearly occlusive to occlusive thrombus extending from left proximal femoral vein to popliteal vein, calf veins poorly seen with occlusive thrombus to left peroneal and nonocclusive thrombus to left posterior tibial vein.   Bilateral PEs 11/2022     Hypertension    Lipoma 2017   Lower back pain    Myocardial infarction (HCC)    approx 2000   Pneumonia due to COVID-19 virus 01/07/2023   hospitalized   Pulmonary embolism and infarction (HCC) 12/07/2022   hospitalized   Thyroid disease    Hypothyroidism   Wears dentures    partial upper    Surgical History: Past Surgical History:  Procedure Laterality Date   BLADDER SURGERY  03/2008   bladder polyp removed [name])   CARDIAC CATHETERIZATION  2000   1 stent placed after MI   CATARACT EXTRACTION W/ INTRAOCULAR LENS  IMPLANT, BILATERAL  2014   [facility], Dr. Druscilla Brownie   CATARACT EXTRACTION W/PHACO Left 09/20/2022   Procedure: CATARACT EXTRACTION PHACO AND INTRAOCULAR LENS PLACEMENT (IOC) LEFT  9.43  00:57.3;  Surgeon: Galen Manila, MD;  Location: [facility];  Service: Ophthalmology;  Laterality: Left;   CHOLECYSTECTOMY     [place]   COLONOSCOPY  03/13/2014   [facility], Dr. Mechele Collin   COLONOSCOPY WITH PROPOFOL N/A 03/03/2016   Procedure: COLONOSCOPY WITH PROPOFOL;  Surgeon: Midge Minium, MD;  Location: [facility];  Service: Endoscopy;  Laterality: N/A;   ESOPHAGOGASTRODUODENOSCOPY (EGD) WITH PROPOFOL N/A 03/03/2016   Procedure: ESOPHAGOGASTRODUODENOSCOPY (EGD) WITH PROPOFOL;  Surgeon: Midge Minium, MD;  Location: [facility];  Service: Endoscopy;  Laterality: N/A;  Interpreter needed   HEMORRHOID SURGERY     [place], [place]   TRANSURETHRAL RESECTION OF PROSTATE  03/24/2008   Dr Sheppard Penton, [facility]    Home Medications:  Allergies as of 03/28/2023       Reactions   Shellfish Allergy Anaphylaxis   Oxybutynin Other (See Comments)   Doesn't remember reaction   Statins Other (See Comments)   Statin  intolerance per cardiology notes        Medication List        Accurate as of March 28, 2023 12:40 PM. If you have any questions, ask your nurse or doctor.          acetaminophen 500 MG tablet Commonly known as: TYLENOL Take 2 tablets (1,000 mg total) by mouth in the morning, at noon, and at bedtime.   ascorbic acid 500 MG tablet Commonly known as: VITAMIN C Take 1 tablet (500 mg total) by mouth daily.   guaiFENesin 600 MG 12 hr tablet Commonly known as: MUCINEX Take 1 tablet (600 mg total) by mouth 2 (two) times daily as needed for cough or to loosen phlegm.   Ipratropium-Albuterol 20-100 MCG/ACT Aers respimat Commonly known as: COMBIVENT Inhale 1 puff into the lungs every 6 (six) hours as needed for wheezing or shortness of breath.   linaclotide 72 MCG capsule Commonly known as: LINZESS Take 1 capsule (72 mcg total) by mouth daily before breakfast.   multivitamin with minerals Tabs tablet Take 1 tablet by mouth daily.   polyethylene glycol powder 17 GM/SCOOP powder Commonly known as: GLYCOLAX/MIRALAX Take 17 g by mouth daily as needed for moderate constipation.   rivaroxaban 20 MG Tabs tablet Commonly known as: XARELTO Take 1 tablet (20 mg total) by mouth daily with supper.   Vitamin D3 25 MCG (1000 UT) Caps Take 1 capsule (1,000 Units total) by mouth daily.   Zinc 220 (50 Zn) MG Caps 1 tablet daily        Allergies:  Allergies  Allergen Reactions   Shellfish Allergy Anaphylaxis   Oxybutynin Other (See Comments)    Doesn't remember reaction   Statins Other (See Comments)    Statin intolerance per cardiology notes    Family History: Family History  Problem Relation Age of Onset   Heart disease Mother    Heart attack Mother    Liver cancer Cousin    Diabetes Neg Hx     Social History:  reports that he has never smoked. He has never used smokeless tobacco. He reports that he does not currently use alcohol. He reports that he does not use  drugs.   Physical Exam: Ht 5\' 4"  (1.626 m)   Wt 173 lb 4 oz (78.6 kg)   BMI 29.74 kg/m   Constitutional:  Alert and oriented, No acute distress. HEENT: [place] AT, moist mucus membranes.  Trachea midline, no masses. Neurologic: Grossly intact, no focal deficits, moving all 4 extremities. Psychiatric: Normal mood and affect.   Pertinent Imaging:  Results for orders placed during the hospital encounter of 02/21/23  Ultrasound renal complete  Narrative CLINICAL DATA:  hydronephrosis  EXAM: RENAL / URINARY TRACT ULTRASOUND COMPLETE  COMPARISON:  December 29, 2022  FINDINGS: Right Kidney:  Renal measurements: 10.4 x 4.5 x 4.4 cm = volume: 108 mL. Echogenicity within normal limits. No mass or hydronephrosis visualized. Cortical thinning.  Left Kidney:  Renal measurements: 11.0 x 4.2 x 4.8 cm = volume: 115 mL. There is cortical  thinning. There is moderate to severe LEFT hydroureteronephrosis. Degree of hydroureteronephrosis is favored similar in comparison to prior CT. No discrete obstructing etiology is visualized. No discrete mass is visualized. Hydroureter persist postvoid.  Bladder:  Appears normal for degree of bladder distention. Prevoid volume 85 ML. Postvoid volume 59 ML.  Other:  None.  IMPRESSION: Moderate to severe LEFT hydroureteronephrosis. Degree of hydroureteronephrosis is favored similar in comparison to prior CT. No discrete obstructing etiology is visualized.   Electronically Signed By: Meda Klinefelter M.D. On: 03/17/2023 14:01 Personally reviewed the above scan and agree with radiologic interpretation.    Assessment & Plan:    1. Left UVJ obstruction  - Unchanged   No follow-ups on file.   [facility] [address], [address] [place], [place] [address] [phone]

## 2023-04-23 DIAGNOSIS — I82409 Acute embolism and thrombosis of unspecified deep veins of unspecified lower extremity: Secondary | ICD-10-CM | POA: Insufficient documentation

## 2023-04-23 NOTE — Progress Notes (Unsigned)
MRN : [id number]  Joseph Osborn [name] is a 89 y.o. (1925-12-17) male who presents with chief complaint of legs hurt and swell.  History of Present Illness:   The patient presents to the office for evaluation of PE.  PE was identified at [facility] by CT angio dated 12/07/2022.    The initial symptoms were chest pain pain and shortness of breath.  No persistent SOB or pleuritic chest pains.  No cough or hemoptysis.  DVT was identified by duplex ultrasound of the left lower extremity dated May 30, 2022.  This demonstrated nearly occlusive thrombus in the left superficial femoral vein and popliteal vein.  The patient has not been using compression therapy at this point.  The patient was started on anticoagulation No blood per rectum or blood in any sputum.  No excessive bruising per the patient.   No recent shortening of the patient's walking distance or new symptoms consistent with claudication.  No history of rest pain symptoms. No new ulcers or wounds of the lower extremities have occurred.  The patient denies amaurosis fugax or recent TIA symptoms. There are no recent neurological changes noted. No recent episodes of angina or shortness of breath documented.   No outpatient medications have been marked as taking for the 04/24/23 encounter (Appointment) with Gilda Crease, Latina Craver, MD.    Past Medical History:  Diagnosis Date   Arthritis    Basal cell carcinoma 02/04/2021   L groin, excised 03/23/21   BPH (benign prostatic hyperplasia)    CAD (coronary artery disease)    Chronic kidney disease    Stage III   Community acquired pneumonia 04/21/2021   Post COVID CAP s/p hospitalization   COPD (chronic obstructive pulmonary disease) (HCC)    COVID-19 04/13/2021   DDD (degenerative disc disease), cervical    DVT (deep venous thrombosis) (HCC) 05/30/2022   Left leg   History of deep vein thrombosis (DVT) of lower extremity 05/30/2022   Venous US 05/30/2022: nearly occlusive  to occlusive thrombus extending from left proximal femoral vein to popliteal vein, calf veins poorly seen with occlusive thrombus to left peroneal and nonocclusive thrombus to left posterior tibial vein.   Bilateral PEs 11/2022     Hypertension    Lipoma 2017   Lower back pain    Myocardial infarction (HCC)    approx 2000   Pneumonia due to COVID-19 virus 01/07/2023   hospitalized   Pulmonary embolism and infarction (HCC) 12/07/2022   hospitalized   Thyroid disease    Hypothyroidism   Wears dentures    partial upper    Past Surgical History:  Procedure Laterality Date   BLADDER SURGERY  03/2008   bladder polyp removed [name])   CARDIAC CATHETERIZATION  2000   1 stent placed after MI   CATARACT EXTRACTION W/ INTRAOCULAR LENS  IMPLANT, BILATERAL  2014   [facility], Dr. Druscilla Brownie   CATARACT EXTRACTION W/PHACO Left 09/20/2022   Procedure: CATARACT EXTRACTION PHACO AND INTRAOCULAR LENS PLACEMENT (IOC) LEFT  9.43  00:57.3;  Surgeon: Galen Manila, MD;  Location: [facility];  Service: Ophthalmology;  Laterality: Left;   CHOLECYSTECTOMY     In [place]   COLONOSCOPY  03/13/2014   [facility], Dr. Mechele Collin   COLONOSCOPY WITH PROPOFOL N/A 03/03/2016   Procedure: COLONOSCOPY WITH PROPOFOL;  Surgeon: Midge Minium, MD;  Location: [facility];  Service: Endoscopy;  Laterality: N/A;   ESOPHAGOGASTRODUODENOSCOPY (EGD) WITH PROPOFOL N/A 03/03/2016   Procedure:  ESOPHAGOGASTRODUODENOSCOPY (EGD) WITH PROPOFOL;  Surgeon: Midge Minium, MD;  Location: [facility];  Service: Endoscopy;  Laterality: N/A;  Interpreter needed   HEMORRHOID SURGERY     [place], [place]   TRANSURETHRAL RESECTION OF PROSTATE  03/24/2008   Dr Sheppard Penton, [facility]    Social History Social History   Tobacco Use   Smoking status: Never   Smokeless tobacco: Never  Vaping Use   Vaping status: Never Used  Substance Use Topics   Alcohol use: Not Currently    Comment: very rare(Holidays)   Drug use:  No    Family History Family History  Problem Relation Age of Onset   Heart disease Mother    Heart attack Mother    Liver cancer Cousin    Diabetes Neg Hx     Allergies  Allergen Reactions   Shellfish Allergy Anaphylaxis   Oxybutynin Other (See Comments)    Doesn't remember reaction   Statins Other (See Comments)    Statin intolerance per cardiology notes     REVIEW OF SYSTEMS (Negative unless checked)  Constitutional: [] Weight loss  [] Fever  [] Chills Cardiac: [] Chest pain   [] Chest pressure   [] Palpitations   [] Shortness of breath when laying flat   [] Shortness of breath with exertion. Vascular:  [] Pain in legs with walking   [x] Pain in legs at rest  [] History of DVT   [] Phlebitis   [x] Swelling in legs   [] Varicose veins   [] Non-healing ulcers Pulmonary:   [] Uses home oxygen   [] Productive cough   [] Hemoptysis   [] Wheeze  [] COPD   [] Asthma Neurologic:  [] Dizziness   [] Seizures   [] History of stroke   [] History of TIA  [] Aphasia   [] Vissual changes   [] Weakness or numbness in arm   [] Weakness or numbness in leg Musculoskeletal:   [] Joint swelling   [] Joint pain   [] Low back pain Hematologic:  [] Easy bruising  [] Easy bleeding   [] Hypercoagulable state   [] Anemic Gastrointestinal:  [] Diarrhea   [] Vomiting  [] Gastroesophageal reflux/heartburn   [] Difficulty swallowing. Genitourinary:  [] Chronic kidney disease   [] Difficult urination  [] Frequent urination   [] Blood in urine Skin:  [] Rashes   [] Ulcers  Psychological:  [] History of anxiety   []  History of major depression.  Physical Examination  There were no vitals filed for this visit. There is no height or weight on file to calculate BMI. Gen: WD/WN, NAD Head: [place]/AT, No temporalis wasting.  Ear/Nose/Throat: Hearing grossly intact, nares w/o erythema or drainage, pinna without lesions Eyes: PER, EOMI, sclera nonicteric.  Neck: Supple, no gross masses.  No JVD.  Pulmonary:  Good air movement, no audible wheezing, no use of  accessory muscles.  Cardiac: RRR, precordium not hyperdynamic. Vascular:  scattered varicosities present bilaterally.  Moderate venous stasis changes to the legs bilaterally.  2+ soft pitting edema. CEAP [id number]   Vessel Right Left  Radial Palpable Palpable  Gastrointestinal: soft, non-distended. No guarding/no peritoneal signs.  Musculoskeletal: M/S 5/5 throughout.  No deformity.  Neurologic: CN 2-12 intact. Pain and light touch intact in extremities.  Symmetrical.  Speech is fluent. Motor exam as listed above. Psychiatric: Judgment intact, Mood & affect appropriate for pt's clinical situation. Dermatologic: Venous rashes no ulcers noted.  No changes consistent with cellulitis. Lymph : No lichenification or skin changes of chronic lymphedema.  CBC Lab Results  Component Value Date   WBC 4.3 01/25/2023   HGB 14.1 01/25/2023   HCT 43.8 01/25/2023   MCV 93.9 01/25/2023   PLT 212.0 01/25/2023  BMET    Component Value Date/Time   NA 140 03/20/2023 1326   K 4.5 03/20/2023 1326   CL 104 03/20/2023 1326   CO2 20 03/20/2023 1326   GLUCOSE 95 03/20/2023 1326   GLUCOSE 88 01/25/2023 1215   BUN 17 03/20/2023 1326   CREATININE 1.02 03/20/2023 1326   CALCIUM 9.5 03/20/2023 1326   GFRNONAA >60 01/09/2023 0324   GFRAA >60 09/08/2017 2300   CrCl cannot be calculated (Patient's most recent lab result is older than the maximum 21 days allowed.).  COAG Lab Results  Component Value Date   INR 2.2 (H) 01/07/2023   INR 1.1 12/07/2022   INR 1.3 (H) 10/06/2022    Radiology No results found.   Assessment/Plan There are no diagnoses linked to this encounter.   Levora Dredge, MD  04/23/2023 4:28 PM

## 2023-04-24 ENCOUNTER — Ambulatory Visit (INDEPENDENT_AMBULATORY_CARE_PROVIDER_SITE_OTHER): Payer: Medicare Other

## 2023-04-24 ENCOUNTER — Ambulatory Visit (INDEPENDENT_AMBULATORY_CARE_PROVIDER_SITE_OTHER): Payer: Medicare Other | Admitting: Vascular Surgery

## 2023-04-24 ENCOUNTER — Encounter (INDEPENDENT_AMBULATORY_CARE_PROVIDER_SITE_OTHER): Payer: Self-pay | Admitting: Vascular Surgery

## 2023-04-24 ENCOUNTER — Other Ambulatory Visit (INDEPENDENT_AMBULATORY_CARE_PROVIDER_SITE_OTHER): Payer: Self-pay | Admitting: Vascular Surgery

## 2023-04-24 VITALS — BP 149/91 | HR 71 | Resp 18 | Ht 63.0 in | Wt 176.4 lb

## 2023-04-24 DIAGNOSIS — I82402 Acute embolism and thrombosis of unspecified deep veins of left lower extremity: Secondary | ICD-10-CM

## 2023-04-24 DIAGNOSIS — J449 Chronic obstructive pulmonary disease, unspecified: Secondary | ICD-10-CM | POA: Diagnosis not present

## 2023-04-24 DIAGNOSIS — M150 Primary generalized (osteo)arthritis: Secondary | ICD-10-CM

## 2023-04-24 DIAGNOSIS — I2699 Other pulmonary embolism without acute cor pulmonale: Secondary | ICD-10-CM

## 2023-04-24 DIAGNOSIS — I82512 Chronic embolism and thrombosis of left femoral vein: Secondary | ICD-10-CM | POA: Diagnosis not present

## 2023-05-16 ENCOUNTER — Encounter: Payer: Self-pay | Admitting: Family Medicine

## 2023-05-16 ENCOUNTER — Ambulatory Visit (INDEPENDENT_AMBULATORY_CARE_PROVIDER_SITE_OTHER): Payer: Medicare Other | Admitting: Family Medicine

## 2023-05-16 VITALS — BP 128/82 | HR 78 | Temp 97.6°F | Ht 63.0 in | Wt 171.5 lb

## 2023-05-16 DIAGNOSIS — N135 Crossing vessel and stricture of ureter without hydronephrosis: Secondary | ICD-10-CM

## 2023-05-16 DIAGNOSIS — K5909 Other constipation: Secondary | ICD-10-CM | POA: Diagnosis not present

## 2023-05-16 DIAGNOSIS — J449 Chronic obstructive pulmonary disease, unspecified: Secondary | ICD-10-CM | POA: Diagnosis not present

## 2023-05-16 DIAGNOSIS — M1711 Unilateral primary osteoarthritis, right knee: Secondary | ICD-10-CM

## 2023-05-16 DIAGNOSIS — J310 Chronic rhinitis: Secondary | ICD-10-CM | POA: Diagnosis not present

## 2023-05-16 DIAGNOSIS — R140 Abdominal distension (gaseous): Secondary | ICD-10-CM

## 2023-05-16 DIAGNOSIS — Z86718 Personal history of other venous thrombosis and embolism: Secondary | ICD-10-CM

## 2023-05-16 DIAGNOSIS — I2699 Other pulmonary embolism without acute cor pulmonale: Secondary | ICD-10-CM

## 2023-05-16 NOTE — Assessment & Plan Note (Signed)
Continue lifelong anticoagulation 

## 2023-05-16 NOTE — Assessment & Plan Note (Signed)
 Saw ortho s/p knee steroid injection 04/2023 with benefit.

## 2023-05-16 NOTE — Progress Notes (Signed)
 Ph: [phone] Fax: [phone]   Patient ID: Joseph Osborn, male    DOB: 1926/01/08, 89 y.o.   MRN: [id number]  This visit was conducted in person.  BP 128/82   Pulse 78   Temp 97.6 F (36.4 C) (Oral)   Ht 5' 3 (1.6 m)   Wt 171 lb 8 oz (77.8 kg)   SpO2 96%   BMI 30.38 kg/m    CC: 3 mo f/u visit  Subjective:   HPI: Joseph Osborn is a 89 y.o. male presenting on 05/16/2023 for Medical Management of Chronic Issues (Here for 3 mo f/u. Pt accompanied by daughter, Ezella. )   Hospitalized 01/07/2023 for COVID pneumonia, s/p full recovery. H/o submassive PE s/p thrombectomy 12/07/2022 - continues xarelto  20mg  daily, planned lifelong as long as tolerated well. Rec compression stockings.  H/o BRBPR 12/2022 without recurrence.   Chronic constipation - predominant concern. Manages with every other day linzess  with benefit, no constipation. Miralax  didn't really help. Notes significant gassiness without abd pain, nausea/vomiting, diarrhea/loose stools. No fevers/chills. No pale stools, no increased buoyancy.  Reviewed reassuring CT angio abd/pelvis from 12/2022 - no acute intra-abdominal process, stable unchanged L hydroureteronephrosis (see below).   COPD - stable period on Combivent  rescue inhaler 1 puff Q6 hours PRN. Didn't feel Incruse Ellipta  was beneficial.   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 Urine cytology was negative. PSA elevated - planned conservative management with Q6 mo labs and imaging, to consider further evaluation of worsening creatinine or new symptoms.   Chronic severe knee osteoarthritis - has seen Dr Cleotilde at [facility]. Avoiding NSAIDs in xarelto  use. Managing with steroid injections - last injection was done 04/28/2023. Rec wearing hinged knee brace on right.   Upcoming cardiology appt 06/2023.      Relevant past medical, surgical,  family and social history reviewed and updated as indicated. Interim medical history since our last visit reviewed. Allergies and medications reviewed and updated. Outpatient Medications Prior to Visit  Medication Sig Dispense Refill   acetaminophen  (TYLENOL ) 500 MG tablet Take 2 tablets (1,000 mg total) by mouth in the morning, at noon, and at bedtime.     Cholecalciferol (VITAMIN D3) 25 MCG (1000 UT) capsule Take 1 capsule (1,000 Units total) by mouth daily. 30 capsule    guaiFENesin  (MUCINEX ) 600 MG 12 hr tablet Take 1 tablet (600 mg total) by mouth 2 (two) times daily as needed for cough or to loosen phlegm. 30 tablet 0   Ipratropium-Albuterol  (COMBIVENT ) 20-100 MCG/ACT AERS respimat Inhale 1 puff into the lungs every 6 (six) hours as needed for wheezing or shortness of breath. 4 g 0   linaclotide  (LINZESS ) 72 MCG capsule Take 1 capsule (72 mcg total) by mouth daily before breakfast. 90 capsule 3   Multiple Vitamin (MULTIVITAMIN WITH MINERALS) TABS tablet Take 1 tablet by mouth daily. 90 tablet 0   polyethylene glycol powder (GLYCOLAX /MIRALAX ) 17 GM/SCOOP powder Take 17 g by mouth daily as needed for moderate constipation.     rivaroxaban  (XARELTO ) 20 MG TABS tablet Take 1 tablet (20 mg total) by mouth daily with supper. 30 tablet 11   vitamin C (VITAMIN C) 500 MG tablet Take 1 tablet (500 mg total) by mouth daily. 30 tablet 0   Zinc  220 (50 Zn) MG CAPS 1 tablet daily 30 capsule 0   No facility-administered medications prior to visit.  Per HPI unless specifically indicated in ROS section below Review of Systems  Objective:  BP 128/82   Pulse 78   Temp 97.6 F (36.4 C) (Oral)   Ht 5' 3 (1.6 m)   Wt 171 lb 8 oz (77.8 kg)   SpO2 96%   BMI 30.38 kg/m   Wt Readings from Last 3 Encounters:  05/16/23 171 lb 8 oz (77.8 kg)  04/24/23 176 lb 6.4 oz (80 kg)  03/28/23 173 lb 4 oz (78.6 kg)      Physical Exam Vitals and nursing note reviewed.  Constitutional:      Appearance:  Normal appearance. He is not ill-appearing.  HENT:     Mouth/Throat:     Mouth: Mucous membranes are moist.     Pharynx: Oropharynx is clear. No oropharyngeal exudate or posterior oropharyngeal erythema.  Eyes:     Extraocular Movements: Extraocular movements intact.     Pupils: Pupils are equal, round, and reactive to light.  Neck:     Thyroid : No thyroid  mass or thyromegaly.  Cardiovascular:     Rate and Rhythm: Normal rate and regular rhythm.     Pulses: Normal pulses.     Heart sounds: Normal heart sounds. No murmur heard. Pulmonary:     Effort: Pulmonary effort is normal. No respiratory distress.     Breath sounds: Normal breath sounds. No wheezing, rhonchi or rales.  Musculoskeletal:     Cervical back: Normal range of motion and neck supple.     Right lower leg: No edema.     Left lower leg: No edema.  Skin:    General: Skin is warm and dry.     Findings: No rash.  Neurological:     Mental Status: He is alert.  Psychiatric:        Mood and Affect: Mood normal.        Behavior: Behavior normal.       Results for orders placed or performed in visit on 03/20/23  PSA   Collection Time: 03/20/23  1:26 PM  Result Value Ref Range   Prostate Specific Ag, Serum 9.1 (H) 0.0 - 4.0 ng/mL  Basic metabolic panel   Collection Time: 03/20/23  1:26 PM  Result Value Ref Range   Glucose 95 70 - 99 mg/dL   BUN 17 10 - 36 mg/dL   Creatinine, Ser 8.97 0.76 - 1.27 mg/dL   eGFR 68 >40 fO/fpw/8.26   BUN/Creatinine Ratio 17 10 - 24   Sodium 140 134 - 144 mmol/L   Potassium 4.5 3.5 - 5.2 mmol/L   Chloride 104 96 - 106 mmol/L   CO2 20 20 - 29 mmol/L   Calcium 9.5 8.6 - 10.2 mg/dL    Assessment & Plan:   Problem List Items Addressed This Visit     COPD (chronic obstructive pulmonary disease) (HCC)   Stable period on as needed Combivent  inhaler.      Chronic constipation - Primary   Chronic, ongoing issue.  Discussed poor dietary fiber intake likely contributes.  Encouraged  increased water  intake, increased fiber in diet with examples to obtain this such as kiwi, prunes, all vegetables. Will increase low-dose Linzess  72 mcg to daily dosing, with option to increase dose if needed. Discussed MiraLAX  use currently as needed. Discussed soluble fiber supplement, recommended trial Citrucel if needed. Discussed trial senna or Senokot OTC laxative as needed. Update with effect of all above.      Chronic rhinitis   Chronic issue. Oral antihistamines  ineffective. Intolerant to nasal sprays. Limited further treatment options for this.      Gassiness   Ongoing trouble, question component of lactose intolerance. Denies diarrhea/loose stools, abdominal pain, pale stools or change in buoyancy. Consider evaluation for pancreatic insufficiency although on the latest imaging study August 2024 pancreas was normal.      Primary osteoarthritis of right knee   Saw ortho s/p knee steroid injection 04/2023 with benefit.       History of deep vein thrombosis (DVT) of lower extremity   Continue lifelong anticoagulation.       Pulmonary embolism and infarction [facility])   Continue lifelong anticoagulation      Acquired ureterovesical junction (UVJ) obstruction   L hydroureteronephrosis 2024 followed by urology - appreciate Dr Bjorn care        No orders of the defined types were placed in this encounter.   No orders of the defined types were placed in this encounter.   Patient Instructions  Elihu dosis de agua diaria. Trabaje en subir cantidad de fibra en la dieta - fruta, verdura, legumbres.  Tome linzess  72mcg diario - tenemos opcion de subir dosis de medicina.  Puede tratar senna o sennokot (sin receta) pastilla para estreimiento.  Regresar en 3 meses para fisico.   Follow up plan: Return in about 3 months (around 08/14/2023) for medicare wellness visit.  Anton Blas, MD

## 2023-05-16 NOTE — Assessment & Plan Note (Signed)
 Ongoing trouble, question component of lactose intolerance. Denies diarrhea/loose stools, abdominal pain, pale stools or change in buoyancy. Consider evaluation for pancreatic insufficiency although on the latest imaging study August 2024 pancreas was normal.

## 2023-05-16 NOTE — Assessment & Plan Note (Signed)
 Chronic issue. Oral antihistamines ineffective. Intolerant to nasal sprays. Limited further treatment options for this.

## 2023-05-16 NOTE — Assessment & Plan Note (Addendum)
 L hydroureteronephrosis 2024 followed by urology - appreciate Dr Delana Meyer care

## 2023-05-16 NOTE — Assessment & Plan Note (Addendum)
 Stable period on as needed Combivent inhaler.

## 2023-05-16 NOTE — Assessment & Plan Note (Signed)
 Chronic, ongoing issue.  Discussed poor dietary fiber intake likely contributes.  Encouraged increased water  intake, increased fiber in diet with examples to obtain this such as kiwi, prunes, all vegetables. Will increase low-dose Linzess  72 mcg to daily dosing, with option to increase dose if needed. Discussed MiraLAX  use currently as needed. Discussed soluble fiber supplement, recommended trial Citrucel if needed. Discussed trial senna or Senokot OTC laxative as needed. Update with effect of all above.

## 2023-05-16 NOTE — Patient Instructions (Addendum)
 Elihu dosis de agua diaria. Trabaje en subir cantidad de fibra en la dieta - fruta, verdura, legumbres.  Tome linzess  72mcg diario - tenemos opcion de subir dosis de medicina.  Puede tratar senna o sennokot (sin receta) pastilla para estreimiento.  Regresar en 3 meses para fisico.

## 2023-06-18 ENCOUNTER — Emergency Department
Admission: EM | Admit: 2023-06-18 | Discharge: 2023-06-18 | Disposition: A | Discharge status: Home / Self Care | Payer: Medicare Other | Attending: Emergency Medicine | Admitting: Emergency Medicine

## 2023-06-18 ENCOUNTER — Other Ambulatory Visit: Payer: Self-pay

## 2023-06-18 DIAGNOSIS — I2510 Atherosclerotic heart disease of native coronary artery without angina pectoris: Secondary | ICD-10-CM | POA: Insufficient documentation

## 2023-06-18 DIAGNOSIS — Z7901 Long term (current) use of anticoagulants: Secondary | ICD-10-CM | POA: Diagnosis not present

## 2023-06-18 DIAGNOSIS — R040 Epistaxis: Secondary | ICD-10-CM | POA: Diagnosis present

## 2023-06-18 DIAGNOSIS — J449 Chronic obstructive pulmonary disease, unspecified: Secondary | ICD-10-CM | POA: Diagnosis not present

## 2023-06-18 MED ORDER — OXYMETAZOLINE HCL 0.05 % NA SOLN
3.0000 | Freq: Once | NASAL | Status: AC
  Administered 2023-06-18: 3 via NASAL
  Filled 2023-06-18: qty 30

## 2023-06-18 NOTE — ED Provider Notes (Signed)
   [facility] Provider Note    Event Date/Time   First MD Initiated Contact with Patient 06/18/23 [phone]     (approximate)  History   Chief Complaint: Epistaxis  HPI  Shaquel Chavous is a 89 y.o. male with a past medical history of CAD, COPD, DVT on Xarelto  who presents to the emergency department for nosebleed.  According to the patient he was watching TV this morning when he began experiencing a nosebleed from the left nostril.  Patient states they attempted to control the nosebleed for over an hour at home without success of the came to the emergency department for evaluation.  Daughter is here with the patient, states this is never happened previously.  Minimal bleeding currently.  Physical Exam   Triage Vital Signs: ED Triage Vitals [06/18/23 1038]  Encounter Vitals Group     BP      Systolic BP Percentile      Diastolic BP Percentile      Pulse      Resp      Temp      Temp src      SpO2      Weight 165 lb (74.8 kg)     Height 5' 3 (1.6 m)     Head Circumference      Peak Flow      Pain Score 0     Pain Loc      Pain Education      Exclude from Growth Chart     Most recent vital signs: There were no vitals filed for this visit.  General: Awake, no distress.  CV:  Good peripheral perfusion.  Resp:  Normal effort. Abd:  No distention.  Other:  Mild bleeding from the left nostril.  No obvious bleeding source identified on physical exam.   ED Results / Procedures / Treatments   MEDICATIONS ORDERED IN ED: Medications  oxymetazoline  (AFRIN) 0.05 % nasal spray 3 spray (has no administration in time range)     IMPRESSION / MDM / ASSESSMENT AND PLAN / ED COURSE  I reviewed the triage vital signs and the nursing notes.  Patient's presentation is most consistent with acute illness / injury with system symptoms.  Patient presents to the emergency department for a nosebleed, on Xarelto .  Patient states fairly moderate bleeding at  home however it has diminished somewhat now.  Currently mild bleeding in the emergency department.  No obvious anterior septal defect identified on exam.  Will use Afrin and a nasal clamp and reassess.  Patient has remained hemostatic for over an hour at this point.  Without clamping.  We will discharge with Afrin and a nasal clamp.  Discussed using petroleum jelly on the anterior septum for the next 1 week.  Also discussed return precautions.  FINAL CLINICAL IMPRESSION(S) / ED DIAGNOSES   Epistaxis  Note:  This document was prepared using Dragon voice recognition software and may include unintentional dictation errors.   Dorothyann Drivers, MD 06/18/23 1224

## 2023-06-18 NOTE — ED Triage Notes (Signed)
 Pt comes with c/o nose bleed that started hour ago while watching TV. Pt is on xarelto . Pt has bleeding present and constant at this time. Pt taken straight to ED 17, MD PAd at bedside and RN heather informed. Pt hooked up to vitals machine.

## 2023-06-19 ENCOUNTER — Telehealth: Payer: Self-pay

## 2023-06-19 NOTE — Transitions of Care (Post Inpatient/ED Visit) (Signed)
   06/19/2023  Name: Joseph Osborn MRN: [id number] DOB: [birth year]  Today's TOC FU Call Status: Today's TOC FU Call Status:: Unsuccessful Call (1st Attempt) Unsuccessful Call (1st Attempt) Date: 06/19/23  Attempted to reach the patient regarding the most recent Inpatient/ED visit.  Follow Up Plan: Additional outreach attempts will be made to reach the patient to complete the Transitions of Care (Post Inpatient/ED visit) call.   Signature Agnes Lawrence, CMA (AAMA)  [facility] [phone]

## 2023-06-21 ENCOUNTER — Ambulatory Visit: Payer: Medicare Other | Admitting: Dermatology

## 2023-06-22 ENCOUNTER — Emergency Department
Admission: EM | Admit: 2023-06-22 | Discharge: 2023-06-22 | Disposition: A | Discharge status: Home / Self Care | Payer: Medicare Other | Attending: Emergency Medicine | Admitting: Emergency Medicine

## 2023-06-22 ENCOUNTER — Ambulatory Visit: Payer: Self-pay | Admitting: Family Medicine

## 2023-06-22 ENCOUNTER — Other Ambulatory Visit: Payer: Self-pay

## 2023-06-22 DIAGNOSIS — R040 Epistaxis: Secondary | ICD-10-CM | POA: Diagnosis present

## 2023-06-22 DIAGNOSIS — I2699 Other pulmonary embolism without acute cor pulmonale: Secondary | ICD-10-CM

## 2023-06-22 DIAGNOSIS — I2510 Atherosclerotic heart disease of native coronary artery without angina pectoris: Secondary | ICD-10-CM | POA: Insufficient documentation

## 2023-06-22 DIAGNOSIS — I10 Essential (primary) hypertension: Secondary | ICD-10-CM | POA: Diagnosis not present

## 2023-06-22 DIAGNOSIS — J449 Chronic obstructive pulmonary disease, unspecified: Secondary | ICD-10-CM | POA: Diagnosis not present

## 2023-06-22 LAB — BASIC METABOLIC PANEL
Anion gap: 8 (ref 5–15)
BUN: 21 mg/dL (ref 8–23)
CO2: 24 mmol/L (ref 22–32)
Calcium: 9.3 mg/dL (ref 8.9–10.3)
Chloride: 108 mmol/L (ref 98–111)
Creatinine, Ser: 0.86 mg/dL (ref 0.61–1.24)
GFR, Estimated: >60 mL/min (ref >60)
Glucose, Bld: 103 mg/dL — ABNORMAL HIGH (ref 70–99)
Potassium: 5 mmol/L (ref 3.5–5.1)
Sodium: 140 mmol/L (ref 135–145)

## 2023-06-22 LAB — PROTIME-INR
INR: 1.5 — ABNORMAL HIGH (ref 0.8–1.2)
Prothrombin Time: 18 s — ABNORMAL HIGH (ref 11.4–15.2)

## 2023-06-22 LAB — CBC
HCT: 43 % (ref 39.0–52.0)
Hemoglobin: 14.4 g/dL (ref 13.0–17.0)
MCH: 31.2 pg (ref 26.0–34.0)
MCHC: 33.5 g/dL (ref 30.0–36.0)
MCV: 93.3 fL (ref 80.0–100.0)
Platelets: 207 10*3/uL (ref 150–400)
RBC: 4.61 MIL/uL (ref 4.22–5.81)
RDW: 13.7 % (ref 11.5–15.5)
WBC: 5.6 10*3/uL (ref 4.0–10.5)
nRBC: 0 % (ref 0.0–0.2)

## 2023-06-22 MED ORDER — SILVER NITRATE-POT NITRATE 75-25 % EX MISC
1.0000 | Freq: Once | CUTANEOUS | Status: AC
  Administered 2023-06-22: 1 via TOPICAL

## 2023-06-22 NOTE — ED Provider Notes (Signed)
[facility] Provider Note    Event Date/Time   First MD Initiated Contact with Patient 06/22/23 1507     (approximate)  History   Chief Complaint: Epistaxis  HPI  Joseph Osborn is a 89 y.o. male with a past medical history of CAD, COPD, hypertension, presents to the emergency department for epistaxis.  Patient was seen by myself 4 days ago in the emergency department for the same.  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  Patient has been using Vaseline in the nostril at home however he states he forgot to use it this morning and while pushing to have a bowel movement he began having a nosebleed once again for the left nostril.  Denies any pain.  They attempted Afrin and nasal clamping at home without success so they came to the emergency department.  While waiting to be seen the nosebleed has stopped currently hemostatic.  Physical Exam   Triage Vital Signs: ED Triage Vitals [06/22/23 1330]  Encounter Vitals Group     BP (!) 136/92     Systolic BP Percentile      Diastolic BP Percentile      Pulse Rate 98     Resp 17     Temp 97.9 F (36.6 C)     Temp src      SpO2 95 %     Weight 163 lb 2.3 oz (74 kg)     Height 5\' 3"  (1.6 m)     Head Circumference      Peak Flow      Pain Score 0     Pain Loc      Pain Education      Exclude from Growth Chart     Most recent vital signs: Vitals:   06/22/23 1330  BP: (!) 136/92  Pulse: 98  Resp: 17  Temp: 97.9 F (36.6 C)  SpO2: 95%    General: Awake, no distress.  CV:  Good peripheral perfusion. Resp:  Normal effort.   Abd:  No distention.  Other:  Small amount of dried blood/scab on the anterior nasal septum of the left nostril possibly a focus of the bleed.  No other obvious area on the septum identified for bleed.  No current bleeding.   ED Results / Procedures / Treatments    MEDICATIONS ORDERED IN ED: Medications  silver nitrate applicators applicator 1 Application (has no administration in time range)     IMPRESSION / MDM / ASSESSMENT AND PLAN / ED COURSE  I reviewed the triage vital signs and the nursing notes.  Patient's presentation is most consistent with acute illness / injury with system symptoms.  Patient presents to the emergency department for epistaxis, recurrent.  It has since stopped while waiting to be seen.  On my evaluation there is a small area on the left anterior septum that appears consistent with a scab.  Will attempt silver nitrate on this area with a plan to continue to use Vaseline at home and to follow-up with ENT this coming week for recheck/reevaluation.  I did offer to pack with a sponge in the emergency department however as it is currently hemostatic they would prefer to hold off on any packing at this time which seems reasonable.  I was able to use silver nitrate to the area of the anterior septum.  As soon as it was  touched it began bleeding, very likely the cause of the patient's recent epistaxis.  I used 2 silver nitrate sticks and achieved hemostasis.  Discussed with the patient and family to use a humidifier at home as well as petroleum jelly/Vaseline to the area.  We will have him follow-up with ENT.  FINAL CLINICAL IMPRESSION(S) / ED DIAGNOSES   Epistaxis   Note:  This document was prepared using Dragon voice recognition software and may include unintentional dictation errors.   Minna Antis, MD 06/22/23 [phone]

## 2023-06-22 NOTE — Telephone Encounter (Signed)
Chief Complaint: Nose bleed Symptoms: Mild headache (intermittent) Frequency: Continuous Pertinent Negatives: Patient denies lightheadedness, dizziness Disposition: [x] ED /[] Urgent Care (no appt availability in office) / [] Appointment(In office/virtual)/ []  [place] [facility]/ [] Home Care/ [] Refused Recommended Disposition /[] [place] [facility]/ []  Follow-up with PCP Additional Notes: Spoke with pt daughter, [name]. Pt has had a nosebleed since 10 AM. Pt was in hospital on Sunday for same issue. Pt was given a spray and clip on nose at hospital. Pt was given spray to take home, tried it today which helped but still bleeding moderately. Pt daughter told to take pt to ED. Pt daughter states she will take him. Pt daughter given care advice and states understanding.  **Pt daughter wants to know if pt can take the xarelto every other day or take it less as she is concerned this could be causing the frequent nosebleeds.**   Copied from CRM #[id number]. Topic: Clinical - Red Word Triage >> Jun 22, 2023 12:03 PM Wametria H wrote: Red Word that prompted transfer to Nurse Triage: Patients daughter states patient is having a nose bleed since 10:10 am, was recently in the emergency room on Sunday for same thing. Reason for Disposition  [1] Bleeding present > 30 minutes AND [2] using correct method of direct pressure  Answer Assessment - Initial Assessment Questions 1. AMOUNT OF BLEEDING: "How bad is the bleeding?" "How much blood was lost?" "Has the bleeding stopped?"   - MILD: needed a couple tissues   - MODERATE: needed many tissues   - SEVERE: large blood clots, soaked many tissues, lasted more than 30 minutes      Moderate 2. ONSET: "When did the nosebleed start?"      10  AM 3. FREQUENCY: "How many nosebleeds have you had in the last 24 hours?"      Denies 4. RECURRENT SYMPTOMS: "Have there been other recent nosebleeds?" If Yes, ask: "How long did it take you to stop the bleeding?" "What worked  best?"      Yes, went to hospital on Sunday for same, gave him a "spray" and clip on nose. They were given a spray to take home that has helped but it still bleeds a little bit 5. CAUSE: "What do you think caused this nosebleed?"     Not sure 6. LOCAL FACTORS: "Do you have any cold symptoms?", "Have you been rubbing or picking at your nose?"     This is what happened on Sunday, pt was cleaning nose Today, not doing that 8. BLOOD THINNERS: "Do you take any blood thinners?" (e.g., aspirin, clopidogrel / Plavix, coumadin, heparin). Notes: Other strong blood thinners include: Arixtra (fondaparinux), Eliquis (apixaban), Pradaxa (dabigatran), and Xarelto (rivaroxaban).     Xarelto 9. OTHER SYMPTOMS: "Do you have any other symptoms?" (e.g., lightheadedness)     Denies  Protocols used: Nosebleed-A-AH

## 2023-06-22 NOTE — Discharge Instructions (Signed)
As we discussed please use a humidifier at home especially in the room that you are sleeping.  Please continue to apply Vaseline to the nose twice daily or more frequent if you are noticing any dryness.  Please call the number provided for ENT to arrange a follow-up as soon as possible.  Return to the emergency department for any significant bleed.

## 2023-06-22 NOTE — ED Triage Notes (Signed)
Pt to ED for nose bleed started at 10am. Report bleeding has slowed some but has not stopped. Seen on 2/9 for same. Reports tried afrin spray and clamp PTA. Bleeding controlled at this time.  +blood thinners.

## 2023-06-23 MED ORDER — RIVAROXABAN 10 MG PO TABS: 10.0000 mg | ORAL_TABLET | Freq: Every day | ORAL | 6 refills | Status: DC

## 2023-06-23 NOTE — Telephone Encounter (Signed)
Seen at ER s/p silver nitrate cauterization.  They started using humidifier.  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   Spoke with daughter Joseph Osborn Discussed normal dosing is Xarelto 20mg  daily.  However due to recurrent nosebleeds reasonable to drop xarelto dose to 10mg  daily - new Rx sent to pharmacy. Previously had episodes of rectal bleed (12/2022).   Upcoming trip to [place].

## 2023-06-23 NOTE — Addendum Note (Signed)
Addended by: Eustaquio Boyden on: 06/23/2023 06:21 PM   Modules accepted: Orders

## 2023-06-26 ENCOUNTER — Telehealth: Payer: Self-pay

## 2023-06-26 NOTE — Transitions of Care (Post Inpatient/ED Visit) (Unsigned)
   06/26/2023  Name: Joseph Osborn MRN: [id number] DOB: 1926-01-30  Today's TOC FU Call Status: Today's TOC FU Call Status:: Unsuccessful Call (1st Attempt) Unsuccessful Call (1st Attempt) Date: 06/26/23  Attempted to reach the patient regarding the most recent Inpatient/ED visit.  Follow Up Plan: Additional outreach attempts will be made to reach the patient to complete the Transitions of Care (Post Inpatient/ED visit) call.   Signature Karena Addison, LPN [facility] Nurse Health Advisor Direct Dial [phone]

## 2023-06-27 ENCOUNTER — Telehealth: Payer: Self-pay

## 2023-06-27 NOTE — Telephone Encounter (Signed)
Copied from CRM [id number]. Topic: Clinical - Medication Question >> Jun 27, 2023 12:58 PM Alcus Dad wrote: Reason for CRM: Daughter of patient stated that he missed a call. Patient stated took 10mg  Sunday night, Monday was very dizzy and stopped taking the pill. Patient is thinking that it is the medicine. Needs advice on what to do

## 2023-06-27 NOTE — Transitions of Care (Post Inpatient/ED Visit) (Unsigned)
   06/27/2023  Name: Joseph Osborn MRN: [id number] DOB: [birth year]  Today's TOC FU Call Status: Today's TOC FU Call Status:: Unsuccessful Call (2nd Attempt) Unsuccessful Call (1st Attempt) Date: 06/26/23 Unsuccessful Call (2nd Attempt) Date: 06/27/23  Attempted to reach the patient regarding the most recent Inpatient/ED visit.  Follow Up Plan: Additional outreach attempts will be made to reach the patient to complete the Transitions of Care (Post Inpatient/ED visit) call.   Signature Karena Addison, LPN [facility] Nurse Health Advisor Direct Dial [phone]

## 2023-06-28 NOTE — Transitions of Care (Post Inpatient/ED Visit) (Signed)
06/28/2023  Name: Joseph Osborn MRN: [id number] DOB: February 10, 1926  Today's TOC FU Call Status: Today's TOC FU Call Status:: Successful TOC FU Call Completed Unsuccessful Call (1st Attempt) Date: 06/26/23 Unsuccessful Call (2nd Attempt) Date: 06/27/23 [facility] FU Call Complete Date: 06/28/23 Patient's Name and Date of Birth confirmed.  Transition Care Management Follow-up Telephone Call Date of Discharge: 06/22/23 Discharge Facility: [facility]) Type of Discharge: Emergency Department Reason for ED Visit: Other: (epistax) How have you been since you were released from the hospital?: Better Any questions or concerns?: No  Items Reviewed: Did you receive and understand the discharge instructions provided?: Yes Medications obtained,verified, and reconciled?: Yes (Medications Reviewed) Any new allergies since your discharge?: No Dietary orders reviewed?: Yes Do you have support at home?: Yes People in Home: child(ren), adult  Medications Reviewed Today: Medications Reviewed Today     Reviewed by Karena Addison, LPN (Licensed Practical Nurse) on 06/28/23 at 1059  Med List Status: <None>   Medication Order Taking? Sig Documenting Provider Last Dose Status Informant  acetaminophen (TYLENOL) 500 MG tablet [id number] Yes Take 2 tablets (1,000 mg total) by mouth in the morning, at noon, and at bedtime. Eustaquio Boyden, MD Taking Active Family Member  Cholecalciferol (VITAMIN D3) 25 MCG (1000 UT) capsule [id number] Yes Take 1 capsule (1,000 Units total) by mouth daily. Eustaquio Boyden, MD Taking Active Family Member  guaiFENesin [facility]) 600 MG 12 hr tablet [id number] Yes Take 1 tablet (600 mg total) by mouth 2 (two) times daily as needed for cough or to loosen phlegm. Arnetha Courser, MD Taking Active   Ipratropium-Albuterol (COMBIVENT) 20-100 MCG/ACT AERS respimat [id number] Yes Inhale 1 puff into the lungs every 6 (six) hours as needed for wheezing or  shortness of breath. Arnetha Courser, MD Taking Active   linaclotide Karlene Einstein) 72 MCG capsule [id number] Yes Take 1 capsule (72 mcg total) by mouth daily before breakfast. Eustaquio Boyden, MD Taking Active Family Member  Multiple Vitamin (MULTIVITAMIN WITH MINERALS) TABS tablet [id number] Yes Take 1 tablet by mouth daily. Arnetha Courser, MD Taking Active   polyethylene glycol powder (GLYCOLAX/MIRALAX) 17 GM/SCOOP powder [id number] Yes Take 17 g by mouth daily as needed for moderate constipation. Eustaquio Boyden, MD Taking Active Family Member  rivaroxaban (XARELTO) 10 MG TABS tablet [id number] Yes Take 1 tablet (10 mg total) by mouth daily with supper.  Patient taking differently: Take 20 mg by mouth daily with supper.   Eustaquio Boyden, MD Taking Active   vitamin C (VITAMIN C) 500 MG tablet [id number] Yes Take 1 tablet (500 mg total) by mouth daily. Arnetha Courser, MD Taking Active   Zinc 220 (50 Zn) MG CAPS [id number] Yes 1 tablet daily Arnetha Courser, MD Taking Active             Home Care and Equipment/Supplies: Were Home Health Services Ordered?: NA Any new equipment or medical supplies ordered?: NA  Functional Questionnaire: Do you need assistance with bathing/showering or dressing?: No Do you need assistance with meal preparation?: No Do you need assistance with eating?: No Do you have difficulty maintaining continence: No Do you need assistance with getting out of bed/getting out of a chair/moving?: No Do you have difficulty managing or taking your medications?: No  Follow up appointments reviewed: PCP Follow-up appointment confirmed?: NA Specialist Hospital Follow-up appointment confirmed?: Yes Date of Specialist follow-up appointment?: 06/27/23 Follow-Up Specialty Provider:: ENT Do you need transportation to your follow-up appointment?: No Do you understand care options if your condition(s) worsen?:  Yes-patient verbalized understanding    SIGNATURE Karena Addison,  LPN [facility] Nurse Health Advisor Direct Dial [phone]

## 2023-06-28 NOTE — Telephone Encounter (Addendum)
I don't think this is the medication as a drop from 20mg  xarelto to 10mg  xarelto (less medication than prior) should not cause dizziness.  Would recommend they retry this.  Dizziness may be something separate. Was it vertigo room spinning or presyncope like near- blacking out?  Blood counts normal at ER so not likely related to anemia

## 2023-06-28 NOTE — Telephone Encounter (Signed)
Contacted pt daughter. Relayed information. Pt daughter verbalized understanding.   [name] says the dizziness feels like only his head is spinning.  She also says that it only happened that one day and he has gotten a little bit better each day.

## 2023-08-03 ENCOUNTER — Encounter: Payer: Self-pay | Admitting: Dermatology

## 2023-08-03 ENCOUNTER — Ambulatory Visit (INDEPENDENT_AMBULATORY_CARE_PROVIDER_SITE_OTHER): Payer: Medicare Other | Admitting: Dermatology

## 2023-08-03 DIAGNOSIS — W908XXA Exposure to other nonionizing radiation, initial encounter: Secondary | ICD-10-CM

## 2023-08-03 DIAGNOSIS — Z1283 Encounter for screening for malignant neoplasm of skin: Secondary | ICD-10-CM

## 2023-08-03 DIAGNOSIS — D492 Neoplasm of unspecified behavior of bone, soft tissue, and skin: Secondary | ICD-10-CM | POA: Diagnosis not present

## 2023-08-03 DIAGNOSIS — D1801 Hemangioma of skin and subcutaneous tissue: Secondary | ICD-10-CM

## 2023-08-03 DIAGNOSIS — C44319 Basal cell carcinoma of skin of other parts of face: Secondary | ICD-10-CM

## 2023-08-03 DIAGNOSIS — L820 Inflamed seborrheic keratosis: Secondary | ICD-10-CM

## 2023-08-03 DIAGNOSIS — L578 Other skin changes due to chronic exposure to nonionizing radiation: Secondary | ICD-10-CM | POA: Diagnosis not present

## 2023-08-03 DIAGNOSIS — L821 Other seborrheic keratosis: Secondary | ICD-10-CM

## 2023-08-03 DIAGNOSIS — Z85828 Personal history of other malignant neoplasm of skin: Secondary | ICD-10-CM

## 2023-08-03 DIAGNOSIS — L918 Other hypertrophic disorders of the skin: Secondary | ICD-10-CM

## 2023-08-03 DIAGNOSIS — L814 Other melanin hyperpigmentation: Secondary | ICD-10-CM | POA: Diagnosis not present

## 2023-08-03 DIAGNOSIS — D229 Melanocytic nevi, unspecified: Secondary | ICD-10-CM

## 2023-08-03 DIAGNOSIS — C4491 Basal cell carcinoma of skin, unspecified: Secondary | ICD-10-CM

## 2023-08-03 HISTORY — DX: Basal cell carcinoma of skin, unspecified: C44.91

## 2023-08-03 NOTE — Progress Notes (Signed)
 Follow-Up Visit   Subjective  Joseph Osborn [name] is a 89 y.o. male who presents for the following: Skin Cancer Screening and Full Body Skin Exam. Patient with history of BCC.  Patient accompanied by his son who is translating for patient.  The patient presents for Total-Body Skin Exam (TBSE) for skin cancer screening and mole check. The patient has spots, moles and lesions to be evaluated, some may be new or changing and the patient may have concern these could be cancer.   The following portions of the chart were reviewed this encounter and updated as appropriate: medications, allergies, medical history  Review of Systems:  No other skin or systemic complaints except as noted in HPI or Assessment and Plan.  Objective  Well appearing patient in no apparent distress; mood and affect are within normal limits.  A full examination was performed including scalp, head, eyes, ears, nose, lips, neck, chest, axillae, abdomen, back, buttocks, bilateral upper extremities, bilateral lower extremities, hands, feet, fingers, toes, fingernails, and toenails. All findings within normal limits unless otherwise noted below.   Relevant physical exam findings are noted in the Assessment and Plan.  Left mandible 8mm pink to black papule with telangectasia   Left posterior thigh 8mm brown plaque with pink center   Assessment & Plan   SKIN CANCER SCREENING PERFORMED TODAY.  ACTINIC DAMAGE - Chronic condition, secondary to cumulative UV/sun exposure - diffuse scaly erythematous macules with underlying dyspigmentation - Recommend daily broad spectrum sunscreen SPF 30+ to sun-exposed areas, reapply every 2 hours as needed.  - Staying in the shade or wearing long sleeves, sun glasses (UVA+UVB protection) and wide brim hats (4-inch brim around the entire circumference of the hat) are also recommended for sun protection.  - Call for new or changing lesions.  LENTIGINES, SEBORRHEIC KERATOSES,  HEMANGIOMAS - Benign normal skin lesions - Benign-appearing - Call for any changes  SEBORRHEIC KERATOSIS - Stuck-on, waxy, tan-brown papules and/or plaques at forehead, L upper chest. - Benign-appearing - Discussed benign etiology and prognosis. - Observe - Call for any changes  HEMANGIOMA Exam: red papule(s) Discussed benign nature. Recommend observation. Call for changes.  MELANOCYTIC NEVI - Tan-brown and/or pink-flesh-colored symmetric macules and papules - Benign appearing on exam today - Observation - Call clinic for new or changing moles - Recommend daily use of broad spectrum spf 30+ sunscreen to sun-exposed areas.   HISTORY OF BASAL CELL CARCINOMA OF THE SKIN L groin- excised 03/23/2021 - No evidence of recurrence today - Recommend regular full body skin exams - Recommend daily broad spectrum sunscreen SPF 30+ to sun-exposed areas, reapply every 2 hours as needed.  - Call if any new or changing lesions are noted between office visits  NEOPLASM OF SKIN (2) Left mandible Skin / nail biopsy Type of biopsy: tangential   Informed consent: discussed and consent obtained   Timeout: patient name, date of birth, surgical site, and procedure verified   Procedure prep:  Patient was prepped and draped in usual sterile fashion Prep type:  Isopropyl alcohol Anesthesia: the lesion was anesthetized in a standard fashion   Anesthetic:  1% lidocaine w/ epinephrine 1-100,000 buffered w/ 8.4% NaHCO3 Instrument used: DermaBlade   Hemostasis achieved with: pressure and aluminum chloride   Outcome: patient tolerated procedure well   Post-procedure details: sterile dressing applied and wound care instructions given   Dressing type: bandage and petrolatum   Specimen 2 - Surgical pathology Differential Diagnosis: R/o BCC  Check Margins: No 8mm pink to  black papule with telangectasia  Left posterior thigh Skin / nail biopsy Type of biopsy: tangential   Informed consent: discussed and  consent obtained   Timeout: patient name, date of birth, surgical site, and procedure verified   Procedure prep:  Patient was prepped and draped in usual sterile fashion Prep type:  Isopropyl alcohol Anesthesia: the lesion was anesthetized in a standard fashion   Anesthetic:  1% lidocaine w/ epinephrine 1-100,000 buffered w/ 8.4% NaHCO3 Instrument used: DermaBlade   Hemostasis achieved with: pressure and aluminum chloride   Outcome: patient tolerated procedure well   Post-procedure details: sterile dressing applied and wound care instructions given   Dressing type: bandage and petrolatum   Specimen 1 - Surgical pathology Differential Diagnosis: SK vs. Other  Check Margins: No 8mm brown plaque with pink center  MULTIPLE BENIGN NEVI   LENTIGINES   ACTINIC ELASTOSIS   SEBORRHEIC KERATOSES   CHERRY ANGIOMA   ACROCHORDON    Return in about 1 year (around 08/02/2024) for Hx BCC, TBSE.  I, Soundra Pilon, CMA, am acting as scribe for Elie Goody, MD .   Documentation: I have reviewed the above documentation for accuracy and completeness, and I agree with the above.  Elie Goody, MD

## 2023-08-03 NOTE — Patient Instructions (Addendum)

## 2023-08-07 ENCOUNTER — Other Ambulatory Visit: Payer: Self-pay | Admitting: Family Medicine

## 2023-08-07 DIAGNOSIS — E559 Vitamin D deficiency, unspecified: Secondary | ICD-10-CM

## 2023-08-07 DIAGNOSIS — N1831 Chronic kidney disease, stage 3a: Secondary | ICD-10-CM

## 2023-08-07 DIAGNOSIS — E039 Hypothyroidism, unspecified: Secondary | ICD-10-CM

## 2023-08-08 ENCOUNTER — Other Ambulatory Visit: Payer: Medicare Other

## 2023-08-08 LAB — SURGICAL PATHOLOGY

## 2023-08-09 ENCOUNTER — Telehealth: Payer: Self-pay

## 2023-08-09 DIAGNOSIS — C4491 Basal cell carcinoma of skin, unspecified: Secondary | ICD-10-CM

## 2023-08-09 NOTE — Telephone Encounter (Signed)
-----   Message from [facility] sent at 08/08/2023  7:34 PM EDT ----- Diagnosis: 1. Skin, left posterior thigh :       SEBORRHEIC KERATOSIS, IRRITATED        2. Skin, left mandible :       BASAL CELL CARCINOMA, NODULAR PATTERN   1. Benign thickening of the top layer of skin. No treatment needed  2. biopsy shows a basal cell skin cancer in the second layer of the skin. This is the most common kind of skin cancer and is caused by damage from sun exposure. Basal cell skin cancers almost never spread beyond the skin, so they are not dangerous to your overall health. However, they will continue to grow, can bleed, cause nonhealing wounds, and disrupt nearby structures unless fully treated.  Treatment: Given the location and type of skin cancer, I recommend Mohs surgery. Mohs surgery involves cutting out the skin cancer and then checking under the microscope to ensure the whole skin cancer was removed. If any skin cancer remains, the surgeon will cut out more until it is fully removed. The cure rate is about 98-99%. Once the Mohs surgeon confirms the skin cancer is out, they will discuss the options to repair or heal the area. You must take it easy for about two weeks after surgery (no lifting over 10-15 lbs, avoid activity to get your heart rate and blood pressure up). It is done at another office outside of [place] ([place], [place], or [place]).

## 2023-08-09 NOTE — Telephone Encounter (Signed)
 Patient's daughter advised bx results, will send referral to Dr. Caralyn Guile. Butch Penny., RMA

## 2023-08-11 ENCOUNTER — Other Ambulatory Visit: Payer: Self-pay | Admitting: Family Medicine

## 2023-08-11 NOTE — Telephone Encounter (Signed)
 Too soon. Rx sent 01/04/24, #90/3 refills to [facility] [address]  Request denied.

## 2023-08-15 ENCOUNTER — Encounter: Payer: Self-pay | Admitting: Family Medicine

## 2023-08-15 ENCOUNTER — Ambulatory Visit: Payer: Medicare Other | Admitting: Family Medicine

## 2023-08-15 VITALS — BP 138/72 | HR 84 | Temp 97.8°F | Ht 64.0 in | Wt 173.2 lb

## 2023-08-15 DIAGNOSIS — N1831 Chronic kidney disease, stage 3a: Secondary | ICD-10-CM

## 2023-08-15 DIAGNOSIS — K5909 Other constipation: Secondary | ICD-10-CM

## 2023-08-15 DIAGNOSIS — Z86718 Personal history of other venous thrombosis and embolism: Secondary | ICD-10-CM

## 2023-08-15 DIAGNOSIS — N401 Enlarged prostate with lower urinary tract symptoms: Secondary | ICD-10-CM

## 2023-08-15 DIAGNOSIS — M1711 Unilateral primary osteoarthritis, right knee: Secondary | ICD-10-CM

## 2023-08-15 DIAGNOSIS — Z0000 Encounter for general adult medical examination without abnormal findings: Secondary | ICD-10-CM

## 2023-08-15 DIAGNOSIS — E559 Vitamin D deficiency, unspecified: Secondary | ICD-10-CM

## 2023-08-15 DIAGNOSIS — H9193 Unspecified hearing loss, bilateral: Secondary | ICD-10-CM

## 2023-08-15 DIAGNOSIS — R140 Abdominal distension (gaseous): Secondary | ICD-10-CM

## 2023-08-15 DIAGNOSIS — J449 Chronic obstructive pulmonary disease, unspecified: Secondary | ICD-10-CM | POA: Diagnosis not present

## 2023-08-15 DIAGNOSIS — E739 Lactose intolerance, unspecified: Secondary | ICD-10-CM

## 2023-08-15 DIAGNOSIS — E039 Hypothyroidism, unspecified: Secondary | ICD-10-CM

## 2023-08-15 DIAGNOSIS — N135 Crossing vessel and stricture of ureter without hydronephrosis: Secondary | ICD-10-CM

## 2023-08-15 DIAGNOSIS — Z66 Do not resuscitate: Secondary | ICD-10-CM

## 2023-08-15 DIAGNOSIS — Z7189 Other specified counseling: Secondary | ICD-10-CM

## 2023-08-15 DIAGNOSIS — R3912 Poor urinary stream: Secondary | ICD-10-CM

## 2023-08-15 DIAGNOSIS — I2510 Atherosclerotic heart disease of native coronary artery without angina pectoris: Secondary | ICD-10-CM

## 2023-08-15 DIAGNOSIS — Z86711 Personal history of pulmonary embolism: Secondary | ICD-10-CM

## 2023-08-15 DIAGNOSIS — H259 Unspecified age-related cataract: Secondary | ICD-10-CM

## 2023-08-15 DIAGNOSIS — C44319 Basal cell carcinoma of skin of other parts of face: Secondary | ICD-10-CM

## 2023-08-15 LAB — CBC WITH DIFFERENTIAL/PLATELET
Basophils Absolute: 0 10*3/uL (ref 0.0–0.1)
Basophils Relative: 0.9 % (ref 0.0–3.0)
Eosinophils Absolute: 0.2 10*3/uL (ref 0.0–0.7)
Eosinophils Relative: 4.7 % (ref 0.0–5.0)
HCT: 42.6 % (ref 39.0–52.0)
Hemoglobin: 14.3 g/dL (ref 13.0–17.0)
Lymphocytes Relative: 38.3 % (ref 12.0–46.0)
Lymphs Abs: 1.7 10*3/uL (ref 0.7–4.0)
MCHC: 33.5 g/dL (ref 30.0–36.0)
MCV: 92.7 fl (ref 78.0–100.0)
Monocytes Absolute: 0.5 10*3/uL (ref 0.1–1.0)
Monocytes Relative: 10.5 % (ref 3.0–12.0)
Neutro Abs: 2 10*3/uL (ref 1.4–7.7)
Neutrophils Relative %: 45.6 % (ref 43.0–77.0)
Platelets: 198 10*3/uL (ref 150.0–400.0)
RBC: 4.6 Mil/uL (ref 4.22–5.81)
RDW: 13.6 % (ref 11.5–15.5)
WBC: 4.5 10*3/uL (ref 4.0–10.5)

## 2023-08-15 LAB — LIPID PANEL
Cholesterol: 181 mg/dL (ref 0–200)
HDL: 37.1 mg/dL — ABNORMAL LOW (ref >39.00)
LDL Cholesterol: 105 mg/dL — ABNORMAL HIGH (ref 0–99)
NonHDL: 144.34
Total CHOL/HDL Ratio: 5
Triglycerides: 198 mg/dL — ABNORMAL HIGH (ref 0.0–149.0)
VLDL: 39.6 mg/dL (ref 0.0–40.0)

## 2023-08-15 LAB — MICROALBUMIN / CREATININE URINE RATIO
Creatinine,U: 140.4 mg/dL
Microalb Creat Ratio: 207.6 mg/g — ABNORMAL HIGH (ref 0.0–30.0)
Microalb, Ur: 29.1 mg/dL — ABNORMAL HIGH (ref 0.0–1.9)

## 2023-08-15 LAB — COMPREHENSIVE METABOLIC PANEL WITH GFR
ALT: 19 U/L (ref 0–53)
AST: 23 U/L (ref 0–37)
Albumin: 4.3 g/dL (ref 3.5–5.2)
Alkaline Phosphatase: 93 U/L (ref 39–117)
BUN: 17 mg/dL (ref 6–23)
CO2: 28 meq/L (ref 19–32)
Calcium: 9.2 mg/dL (ref 8.4–10.5)
Chloride: 104 meq/L (ref 96–112)
Creatinine, Ser: 0.94 mg/dL (ref 0.40–1.50)
GFR: 68.61 mL/min (ref >60.00)
Glucose, Bld: 90 mg/dL (ref 70–99)
Potassium: 4.7 meq/L (ref 3.5–5.1)
Sodium: 140 meq/L (ref 135–145)
Total Bilirubin: 0.8 mg/dL (ref 0.2–1.2)
Total Protein: 6.9 g/dL (ref 6.0–8.3)

## 2023-08-15 LAB — VITAMIN D 25 HYDROXY (VIT D DEFICIENCY, FRACTURES): VITD: 29.08 ng/mL — ABNORMAL LOW (ref 30.00–100.00)

## 2023-08-15 LAB — TSH: TSH: 5.9 u[IU]/mL — ABNORMAL HIGH (ref 0.35–5.50)

## 2023-08-15 LAB — T4, FREE: Free T4: 0.81 ng/dL (ref 0.60–1.60)

## 2023-08-15 LAB — PHOSPHORUS: Phosphorus: 3 mg/dL (ref 2.3–4.6)

## 2023-08-15 NOTE — Progress Notes (Unsigned)
 Ph: [phone] Fax: [phone]   Patient ID: Joseph Osborn, male    DOB: Dec 03, 1925, 89 y.o.   MRN: [id number]  This visit was conducted in person.  BP 138/72   Pulse 84   Temp 97.8 F (36.6 C) (Oral)   Ht 5\' 4"  (1.626 m)   Wt 173 lb 4 oz (78.6 kg)   SpO2 95%   BMI 29.74 kg/m    CC: AMW Subjective:   HPI: Joseph Osborn is a 89 y.o. male presenting on 08/15/2023 for Medicare Wellness (Pt accompanied by daughter, [name]. )   Did not see health advisor.  Recent trip to visit sick brother in [place], [place]. He states he is not planning on further international travel.  5 siblings total, 2 have pased away.   Hearing Screening   500Hz  1000Hz  2000Hz  4000Hz   Right ear 0 0 0 0  Left ear 25 0 25 40  Comments: Per pt's daughter, [name] (attending today's OV), pt recently seen by ENT and told he needs B hearing aids. However, pt is declining at this time.   Vision Screening   Right eye Left eye Both eyes  Without correction 20/200 20/100 20/100  With correction       Flowsheet Row Office Visit from 08/15/2023 in [facility]  PHQ-2 Total Score 0          08/15/2023   12:05 PM 10/10/2022    3:00 PM 05/30/2022   12:48 PM 07/15/2021   12:05 PM 07/14/2020    9:52 AM  Fall Risk   Falls in the past year? 0 1 1 0 0  Number falls in past yr:  0 0 0 0  Injury with Fall?  1 0 0 0  Risk for fall due to :  History of fall(s)  Other (Comment) Medication side effect  Risk for fall due to: Comment    lost balance   Follow up  Falls evaluation completed  Falls prevention discussed Falls evaluation completed;Falls prevention discussed   Known severe R knee osteoarthritis s/p steroid injections by sports med and ortho.   Recently found to have BCC to L mandible - recommending Moh's surgery.   Indication for lifelong Xarelto is h/o DVTs (large occlusive LLE from L proximal femoral vein to popliteal vein and calf veins) followed by PE s/p  thrombectomy 11/2022.  Due to recurrent nosebleeds we dropped xarelto dose to 10mg  daily 06/2023. However he decided not to take and continues 20mg  daily. Previously had episodes of rectal bleed (12/2022).   Notes ongoing gassiness and belching.  He takes lizness daily with good effect.  No diarrhea or loose stools.  Has tried GasX without benefit.   Preventative: Colon cancer screening - no blood in stool. Known hemorrhoids. iFOB normal 03/2020 - age out.  Prostate cancer screening - BPH s/p TURP. Aged out. Continue flomax 0.4mg  daily Lung cancer screening - aged out Flu shot - yearly  COVID vaccine Moderna 08/2019, 09/2019, booster 05/2020  Tetanus shot - unsure Prevnar-20 today  Shingrix - still awaiting records - may check with pharmacy  Advanced directive discussion - discussed. Would want daughter [name] to be HCPOA. DNR form, confirmed. Packet previously provided - he declines filling.  Seat belt use discussed Sunscreen use discussed. No changing moles on skin. Sees derm yearly Gwen Pounds --> [facility]). Recent BCC found.  Dentist - saw once, doesn't want to return.  Eye exam - yearly ([facility]) - has  declined cataract surgery  Non smoker  Alcohol - none Constipation - stable period on Linzess daily Bladder - stable period on flomax - no incontinence    Lives with wife, daughter [name]) and her husband and son From [place], [place] Occ: retired, previously worked Visual merchandiser Activity : limited by disability from osteoarthritis     Relevant past medical, surgical, family and social history reviewed and updated as indicated. Interim medical history since our last visit reviewed. Allergies and medications reviewed and updated. Outpatient Medications Prior to Visit  Medication Sig Dispense Refill   acetaminophen (TYLENOL) 500 MG tablet Take 2 tablets (1,000 mg total) by mouth in the morning, at noon, and at bedtime.     Cholecalciferol (VITAMIN D3) 25 MCG (1000 UT) capsule Take 1  capsule (1,000 Units total) by mouth daily. 30 capsule    Multiple Vitamin (MULTIVITAMIN WITH MINERALS) TABS tablet Take 1 tablet by mouth daily. 90 tablet 0   polyethylene glycol powder (GLYCOLAX/MIRALAX) 17 GM/SCOOP powder Take 17 g by mouth daily as needed for moderate constipation.     vitamin C (VITAMIN C) 500 MG tablet Take 1 tablet (500 mg total) by mouth daily. 30 tablet 0   Zinc 220 (50 Zn) MG CAPS 1 tablet daily 30 capsule 0   Ipratropium-Albuterol (COMBIVENT) 20-100 MCG/ACT AERS respimat Inhale 1 puff into the lungs every 6 (six) hours as needed for wheezing or shortness of breath. 4 g 0   linaclotide (LINZESS) 72 MCG capsule Take 1 capsule (72 mcg total) by mouth daily before breakfast. 90 capsule 3   rivaroxaban (XARELTO) 10 MG TABS tablet Take 1 tablet (10 mg total) by mouth daily with supper. (Patient taking differently: Take 20 mg by mouth daily with supper.) 30 tablet 6   guaiFENesin (MUCINEX) 600 MG 12 hr tablet Take 1 tablet (600 mg total) by mouth 2 (two) times daily as needed for cough or to loosen phlegm. 30 tablet 0   No facility-administered medications prior to visit.     Per HPI unless specifically indicated in ROS section below Review of Systems  Objective:  BP 138/72   Pulse 84   Temp 97.8 F (36.6 C) (Oral)   Ht 5\' 4"  (1.626 m)   Wt 173 lb 4 oz (78.6 kg)   SpO2 95%   BMI 29.74 kg/m   Wt Readings from Last 3 Encounters:  08/15/23 173 lb 4 oz (78.6 kg)  06/22/23 163 lb 2.3 oz (74 kg)  06/18/23 165 lb (74.8 kg)      Physical Exam Vitals and nursing note reviewed.  Constitutional:      General: He is not in acute distress.    Appearance: Normal appearance. He is well-developed. He is not ill-appearing.     Comments: Ambulates with assistive device, knee brace  HENT:     Head: Normocephalic and atraumatic.     Right Ear: Hearing, tympanic membrane, ear canal and external ear normal.     Left Ear: Hearing, tympanic membrane, ear canal and external ear  normal.     Mouth/Throat:     Mouth: Mucous membranes are moist.     Pharynx: Oropharynx is clear. No oropharyngeal exudate or posterior oropharyngeal erythema.  Eyes:     General: No scleral icterus.    Extraocular Movements: Extraocular movements intact.     Conjunctiva/sclera: Conjunctivae normal.     Pupils: Pupils are equal, round, and reactive to light.  Neck:     Thyroid: No thyroid mass or  thyromegaly.     Vascular: No carotid bruit.  Cardiovascular:     Rate and Rhythm: Normal rate and regular rhythm.     Pulses: Normal pulses.          Radial pulses are 2+ on the right side and 2+ on the left side.     Heart sounds: Normal heart sounds. No murmur heard. Pulmonary:     Effort: Pulmonary effort is normal. No respiratory distress.     Breath sounds: Normal breath sounds. No wheezing, rhonchi or rales.  Abdominal:     General: Bowel sounds are normal. There is no distension.     Palpations: Abdomen is soft. There is no mass.     Tenderness: There is no abdominal tenderness. There is no guarding or rebound.     Hernia: No hernia is present.  Musculoskeletal:        General: Normal range of motion.     Cervical back: Normal range of motion and neck supple.     Right lower leg: No edema.     Left lower leg: No edema.  Lymphadenopathy:     Cervical: No cervical adenopathy.  Skin:    General: Skin is warm and dry.     Findings: No rash.  Neurological:     General: No focal deficit present.     Mental Status: He is alert and oriented to person, place, and time.  Psychiatric:        Mood and Affect: Mood normal.        Behavior: Behavior normal.        Thought Content: Thought content normal.        Judgment: Judgment normal.       Results for orders placed or performed in visit on 08/15/23  Phosphorus   Collection Time: 08/15/23 12:49 PM  Result Value Ref Range   Phosphorus 3.0 2.3 - 4.6 mg/dL  Microalbumin / creatinine urine ratio   Collection Time: 08/15/23 12:49  PM  Result Value Ref Range   Microalb, Ur 29.1 (H) 0.0 - 1.9 mg/dL   Creatinine,U 696.2 mg/dL   Microalb Creat Ratio 207.6 (H) 0.0 - 30.0 mg/g  Parathyroid hormone, intact (no Ca)   Collection Time: 08/15/23 12:49 PM  Result Value Ref Range   PTH 57 16 - 77 pg/mL  CBC with Differential/Platelet   Collection Time: 08/15/23 12:49 PM  Result Value Ref Range   WBC 4.5 4.0 - 10.5 K/uL   RBC 4.60 4.22 - 5.81 Mil/uL   Hemoglobin 14.3 13.0 - 17.0 g/dL   HCT 95.2 84.1 - 32.4 %   MCV 92.7 78.0 - 100.0 fl   MCHC 33.5 30.0 - 36.0 g/dL   RDW 40.1 02.7 - 25.3 %   Platelets 198.0 150.0 - 400.0 K/uL   Neutrophils Relative % 45.6 43.0 - 77.0 %   Lymphocytes Relative 38.3 12.0 - 46.0 %   Monocytes Relative 10.5 3.0 - 12.0 %   Eosinophils Relative 4.7 0.0 - 5.0 %   Basophils Relative 0.9 0.0 - 3.0 %   Neutro Abs 2.0 1.4 - 7.7 K/uL   Lymphs Abs 1.7 0.7 - 4.0 K/uL   Monocytes Absolute 0.5 0.1 - 1.0 K/uL   Eosinophils Absolute 0.2 0.0 - 0.7 K/uL   Basophils Absolute 0.0 0.0 - 0.1 K/uL  Lipid panel   Collection Time: 08/15/23 12:49 PM  Result Value Ref Range   Cholesterol 181 0 - 200 mg/dL   Triglycerides 664.4 (H) 0.0 -  149.0 mg/dL   HDL 40.98 (L) >11.91 mg/dL   VLDL 47.8 0.0 - 29.5 mg/dL   LDL Cholesterol 621 (H) 0 - 99 mg/dL   Total CHOL/HDL Ratio 5    NonHDL 144.34   Comprehensive metabolic panel with GFR   Collection Time: 08/15/23 12:49 PM  Result Value Ref Range   Sodium 140 135 - 145 mEq/L   Potassium 4.7 3.5 - 5.1 mEq/L   Chloride 104 96 - 112 mEq/L   CO2 28 19 - 32 mEq/L   Glucose, Bld 90 70 - 99 mg/dL   BUN 17 6 - 23 mg/dL   Creatinine, Ser 3.08 0.40 - 1.50 mg/dL   Total Bilirubin 0.8 0.2 - 1.2 mg/dL   Alkaline Phosphatase 93 39 - 117 U/L   AST 23 0 - 37 U/L   ALT 19 0 - 53 U/L   Total Protein 6.9 6.0 - 8.3 g/dL   Albumin 4.3 3.5 - 5.2 g/dL   GFR 65.78 >46.96 mL/min   Calcium 9.2 8.4 - 10.5 mg/dL  TSH   Collection Time: 08/15/23 12:49 PM  Result Value Ref Range   TSH  5.90 (H) 0.35 - 5.50 uIU/mL  T4, free   Collection Time: 08/15/23 12:49 PM  Result Value Ref Range   Free T4 0.81 0.60 - 1.60 ng/dL  VITAMIN D 25 Hydroxy (Vit-D Deficiency, Fractures)   Collection Time: 08/15/23 12:49 PM  Result Value Ref Range   VITD 29.08 (L) 30.00 - 100.00 ng/mL   Assessment & Plan:   Problem List Items Addressed This Visit     Advanced directives, counseling/discussion (Chronic)   Previously discussed, packet previously provided. He is not interested in filling out. Would want children including [name] to be HCPOA. DNR.       DNR (do not resuscitate) (Chronic)   Confirmed with patient.       Medicare annual wellness visit, subsequent - Primary (Chronic)   I have personally reviewed the Medicare Annual Wellness questionnaire and have noted 1. The patient's medical and social history 2. Their use of alcohol, tobacco or illicit drugs 3. Their current medications and supplements 4. The patient's functional ability including ADL's, fall risks, home safety risks and hearing or visual impairment. Cognitive function has been assessed and addressed as indicated.  5. Diet and physical activity 6. Evidence for depression or mood disorders The patients weight, height, BMI have been recorded in the chart. I have made referrals, counseling and provided education to the patient based on review of the above and I have provided the pt with a written personalized care plan for preventive services. Provider list updated.. See scanned questionairre as needed for further documentation. Reviewed preventative protocols and updated unless pt declined.       Benign prostatic hyperplasia   S/p TURP. Continue flomax 0.4mg  daily. Consider finasteride if worsening LUTS.       CAD (coronary artery disease)   S/p remote PCI with stent.  Has seen Dr Nydia Bouton at [facility] Not on statin or aspirin.  Continue xarelto.  Asxs from cardiac standpoint.       Relevant Medications    rivaroxaban (XARELTO) 20 MG TABS tablet   CKD (chronic kidney disease), stage III (HCC)   Update renal function today.       Relevant Orders   Phosphorus (Completed)   Microalbumin / creatinine urine ratio (Completed)   Parathyroid hormone, intact (no Ca) (Completed)   CBC with Differential/Platelet (Completed)   Lipid panel (Completed)  Comprehensive metabolic panel with GFR (Completed)   COPD (chronic obstructive pulmonary disease) (HCC)   Continues PRN combivent inhaler.      Relevant Medications   Ipratropium-Albuterol (COMBIVENT) 20-100 MCG/ACT AERS respimat   Borderline hypothyroidism   Update thyroid function.  Previous levothyroxine trial was not beneficial       Relevant Orders   TSH (Completed)   T4, free (Completed)   Chronic constipation   Continue linzess daily with PRN miralax.       Gassiness   Predominant concern. Discussed trial Concha Pyo (has previously tried), avoiding lactose (already does this), start limiting gas producing foods. No red flags. Pancreas has appeared normal on prior imaging.       Vitamin D deficiency   Update levels on d3 1000 units daily replacement      Relevant Orders   VITAMIN D 25 Hydroxy (Vit-D Deficiency, Fractures) (Completed)   Bilateral hearing loss   Saw ENT, rec hearing aides but he has declined.       Basal cell carcinoma (BCC) of left cheek   Found to be BCC, recommending Mohs surgery.       Primary osteoarthritis of right knee   Chronic, severe, has seen ortho and had steroid injections. Continue regular knee brace use.  Avoiding surgery given age/comorbidities.      History of deep vein thrombosis (DVT) of lower extremity   Lactose intolerance   Continue to avoid lactose.       History of pulmonary embolism   Continue lifelong anticoagulation. He decided not to take lower xarelto dose so continues 20mg  daily.       Acquired ureterovesical junction (UVJ) obstruction   Saw urology, rec conservative  management.       Cataracts, bilateral   Decreased visual acuity presumed from cataracts, has declined surgery. Sees ophtho Porfilio regularly        Meds ordered this encounter  Medications   rivaroxaban (XARELTO) 20 MG TABS tablet    Sig: Take 1 tablet (20 mg total) by mouth daily with supper.    Dispense:  90 tablet    Refill:  3   Ipratropium-Albuterol (COMBIVENT) 20-100 MCG/ACT AERS respimat    Sig: Inhale 1 puff into the lungs every 6 (six) hours as needed for wheezing or shortness of breath.    Dispense:  4 g    Refill:  1   linaclotide (LINZESS) 72 MCG capsule    Sig: Take 1 capsule (72 mcg total) by mouth daily before breakfast.    Dispense:  90 capsule    Refill:  3    Orders Placed This Encounter  Procedures   Phosphorus   Microalbumin / creatinine urine ratio   Parathyroid hormone, intact (no Ca)   CBC with Differential/Platelet   Lipid panel   Comprehensive metabolic panel with GFR   TSH   T4, free   VITAMIN D 25 Hydroxy (Vit-D Deficiency, Fractures)    Patient Instructions  Laboratorios hoy Puede tratar Concha Pyo, de limitar comidas que causen gas (beans, onions, celery, carrots, raisins, bananas, apricots, prunes, brussel sprouts, wheat germ, pretzels). Limite lactosa en la dieta.  Regresar en 6 meses para proxima visita.   Follow up plan: Return in about 6 months (around 02/14/2024) for follow up visit.  Eustaquio Boyden, MD

## 2023-08-15 NOTE — Assessment & Plan Note (Signed)
Previously discussed, packet previously provided

## 2023-08-15 NOTE — Patient Instructions (Addendum)
 Laboratorios hoy Puede tratar Joseph Osborn, de limitar comidas que causen gas (beans, onions, celery, carrots, raisins, bananas, apricots, prunes, brussel sprouts, wheat germ, pretzels). Limite lactosa en la dieta.  Regresar en 6 meses para proxima visita.

## 2023-08-15 NOTE — Assessment & Plan Note (Signed)

## 2023-08-15 NOTE — Assessment & Plan Note (Signed)
 Confirmed with patient.

## 2023-08-16 LAB — PARATHYROID HORMONE, INTACT (NO CA): PTH: 57 pg/mL (ref 16–77)

## 2023-08-17 ENCOUNTER — Encounter: Payer: Self-pay | Admitting: Family Medicine

## 2023-08-17 DIAGNOSIS — H269 Unspecified cataract: Secondary | ICD-10-CM | POA: Insufficient documentation

## 2023-08-17 DIAGNOSIS — R809 Proteinuria, unspecified: Secondary | ICD-10-CM | POA: Insufficient documentation

## 2023-08-17 MED ORDER — RIVAROXABAN 20 MG PO TABS: 20.0000 mg | ORAL_TABLET | Freq: Every day | ORAL | 3 refills | Status: AC

## 2023-08-17 MED ORDER — LINACLOTIDE 72 MCG PO CAPS: 72.0000 ug | ORAL_CAPSULE | Freq: Every day | ORAL | 3 refills | Status: AC

## 2023-08-17 MED ORDER — IPRATROPIUM-ALBUTEROL 20-100 MCG/ACT IN AERS: 1.0000 | INHALATION_SPRAY | Freq: Four times a day (QID) | RESPIRATORY_TRACT | 1 refills | Status: AC | PRN

## 2023-08-17 NOTE — Assessment & Plan Note (Signed)
 Update levels on d3 1000 units daily replacement

## 2023-08-17 NOTE — Assessment & Plan Note (Signed)
 Continues PRN combivent inhaler.

## 2023-08-17 NOTE — Assessment & Plan Note (Signed)
 Continue linzess daily with PRN miralax.

## 2023-08-17 NOTE — Assessment & Plan Note (Signed)
 Saw ENT, rec hearing aides but he has declined.

## 2023-08-17 NOTE — Assessment & Plan Note (Signed)
 Continue lifelong anticoagulation. He decided not to take lower xarelto dose so continues 20mg  daily.

## 2023-08-17 NOTE — Assessment & Plan Note (Addendum)
 S/p remote PCI with stent.  Has seen Dr Nydia Bouton at [place] Not on statin or aspirin.  Continue xarelto.  Asxs from cardiac standpoint.

## 2023-08-17 NOTE — Assessment & Plan Note (Signed)
 Update thyroid function.  Previous levothyroxine trial was not beneficial

## 2023-08-17 NOTE — Assessment & Plan Note (Addendum)
 Predominant concern. Discussed trial Concha Pyo (has previously tried), avoiding lactose (already does this), start limiting gas producing foods. No red flags. Pancreas has appeared normal on prior imaging.

## 2023-08-17 NOTE — Assessment & Plan Note (Signed)
 Chronic, severe, has seen ortho and had steroid injections. Continue regular knee brace use.  Avoiding surgery given age/comorbidities.

## 2023-08-17 NOTE — Assessment & Plan Note (Signed)
 Continue to avoid lactose

## 2023-08-17 NOTE — Assessment & Plan Note (Signed)
 Decreased visual acuity presumed from cataracts, has declined surgery. Sees ophtho Porfilio regularly

## 2023-08-17 NOTE — Assessment & Plan Note (Signed)
 S/p TURP. Continue flomax 0.4mg  daily. Consider finasteride if worsening LUTS.

## 2023-08-17 NOTE — Assessment & Plan Note (Signed)
 Found to be BCC, recommending Mohs surgery.

## 2023-08-17 NOTE — Assessment & Plan Note (Signed)
 Saw urology, rec conservative management.

## 2023-08-17 NOTE — Assessment & Plan Note (Signed)
Update renal function today.

## 2023-08-20 ENCOUNTER — Emergency Department

## 2023-08-20 ENCOUNTER — Other Ambulatory Visit: Payer: Self-pay

## 2023-08-20 ENCOUNTER — Emergency Department
Admission: EM | Admit: 2023-08-20 | Discharge: 2023-08-20 | Disposition: A | Discharge status: Home / Self Care | Attending: Emergency Medicine | Admitting: Emergency Medicine

## 2023-08-20 DIAGNOSIS — W19XXXA Unspecified fall, initial encounter: Secondary | ICD-10-CM

## 2023-08-20 DIAGNOSIS — W01198A Fall on same level from slipping, tripping and stumbling with subsequent striking against other object, initial encounter: Secondary | ICD-10-CM | POA: Insufficient documentation

## 2023-08-20 DIAGNOSIS — S0990XA Unspecified injury of head, initial encounter: Secondary | ICD-10-CM | POA: Insufficient documentation

## 2023-08-20 DIAGNOSIS — Z7901 Long term (current) use of anticoagulants: Secondary | ICD-10-CM | POA: Diagnosis not present

## 2023-08-20 DIAGNOSIS — S4991XA Unspecified injury of right shoulder and upper arm, initial encounter: Secondary | ICD-10-CM | POA: Diagnosis present

## 2023-08-20 MED ORDER — ACETAMINOPHEN 325 MG PO TABS
650.0000 mg | ORAL_TABLET | Freq: Once | ORAL | Status: AC
Start: 2023-08-20 — End: 2023-08-20
  Administered 2023-08-20: 650 mg via ORAL
  Filled 2023-08-20: qty 2

## 2023-08-20 NOTE — ED Notes (Signed)
 89 yom lying supine in the bed with his head slightly elevated. The pt is warm, pink, and dry. The pt is alert and oriented x 4. The pt's daughter advised the was walking back from changing the tv when he accidentally fell. The pt is c/o right sided shoulder pain. The pt's daughter denies any loss of consciousness.

## 2023-08-20 NOTE — ED Provider Notes (Signed)
 [facility] Provider Note    Event Date/Time   First MD Initiated Contact with Patient 08/20/23 1448     (approximate)   History   Fall   HPI  Joseph Osborn [name] is a 89 y.o. male who presents after a fall.  He is on Xarelto.  Possible head injury but complains primarily of right shoulder pain.  No other injuries reported no neurodeficits.  No headache.     Physical Exam   Triage Vital Signs: ED Triage Vitals  Encounter Vitals Group     BP 08/20/23 1350 (!) 144/73     Systolic BP Percentile --      Diastolic BP Percentile --      Pulse Rate 08/20/23 1350 88     Resp 08/20/23 1350 18     Temp 08/20/23 1350 98 F (36.7 C)     Temp Source 08/20/23 1350 Oral     SpO2 08/20/23 1350 95 %     Weight 08/20/23 1348 78 kg (171 lb 15.3 oz)     Height 08/20/23 1348 1.626 m (5\' 4" )     Head Circumference --      Peak Flow --      Pain Score 08/20/23 1348 10     Pain Loc --      Pain Education --      Exclude from Growth Chart --     Most recent vital signs: Vitals:   08/20/23 1350  BP: (!) 144/73  Pulse: 88  Resp: 18  Temp: 98 F (36.7 C)  SpO2: 95%     General: Awake, no distress.  CV:  Good peripheral perfusion.  Resp:  Normal effort.  Abd:  No distention.  Other:  Right arm: Pain when abducting at the right shoulder, tenderness along the humeral head, clavicle normal, no chest wall tenderness, no vertebral tenderness to palpation, normal range of motion of the lower extremities with no pain with axial load on both hips.   ED Results / Procedures / Treatments   Labs (all labs ordered are listed, but only abnormal results are displayed) Labs Reviewed - No data to display   EKG     RADIOLOGY Shoulder x-ray viewed interpreted me, no acute abnormality    PROCEDURES:  Critical Care performed:   Procedures   MEDICATIONS ORDERED IN ED: Medications  acetaminophen (TYLENOL) tablet 650 mg (650 mg Oral Given 08/20/23 1530)      IMPRESSION / MDM / ASSESSMENT AND PLAN / ED COURSE  I reviewed the triage vital signs and the nursing notes. Patient's presentation is most consistent with acute presentation with potential threat to life or bodily function.  Patient presents for fall with possible head injury, right shoulder pain as detailed above  CT head and cervical spine are reassuring, neuroexam is reassuring  Right shoulder x-ray is negative for acute fracture but patient has significant pain, will send for CT to evaluate further  CT scan shows no bony abnormalities, concern for rotator cuff injury, sling applied, close follow-up with orthopedics      FINAL CLINICAL IMPRESSION(S) / ED DIAGNOSES   Final diagnoses:  Fall, initial encounter  Injury of right shoulder, initial encounter     Rx / DC Orders   ED Discharge Orders     None        Note:  This document was prepared using Dragon voice recognition software and may include unintentional dictation errors.   Bryson Carbine, MD 08/20/23 [phone]

## 2023-08-20 NOTE — ED Triage Notes (Signed)
 Pt sts that he fell in the living room this afternoon. Pt sts that he did hit his head and is having right arm/ shoulder pain. Pt sts that he did not have any LOC.

## 2023-08-24 ENCOUNTER — Encounter: Payer: Self-pay | Admitting: Dermatology

## 2023-08-29 ENCOUNTER — Encounter: Payer: Self-pay | Admitting: Dermatology

## 2023-08-29 ENCOUNTER — Ambulatory Visit (INDEPENDENT_AMBULATORY_CARE_PROVIDER_SITE_OTHER): Admitting: Dermatology

## 2023-08-29 VITALS — BP 133/81 | HR 94 | Temp 98.0°F

## 2023-08-29 DIAGNOSIS — L814 Other melanin hyperpigmentation: Secondary | ICD-10-CM

## 2023-08-29 DIAGNOSIS — C44319 Basal cell carcinoma of skin of other parts of face: Secondary | ICD-10-CM

## 2023-08-29 DIAGNOSIS — C4491 Basal cell carcinoma of skin, unspecified: Secondary | ICD-10-CM

## 2023-08-29 DIAGNOSIS — L579 Skin changes due to chronic exposure to nonionizing radiation, unspecified: Secondary | ICD-10-CM | POA: Diagnosis not present

## 2023-08-29 NOTE — Progress Notes (Signed)
 Follow-Up Visit   Subjective  OJANI BERENSON [name] is a 89 y.o. 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 Felipe Horton. Patient is accompanied by his daughter and a Spanish interpreter.  The following portions of the chart were reviewed this encounter and updated as appropriate: medications, allergies, medical history  Review of Systems:  No other skin or systemic complaints except as noted in HPI or Assessment and Plan.  Objective  Well appearing patient in no apparent distress; mood and affect are within normal limits.  A focused examination was performed of the following areas: Left mandible Relevant physical exam findings are noted in the Assessment and Plan.   Left Anterior Mandible Healing biopsy site   Assessment & Plan   BASAL CELL CARCINOMA (BCC), UNSPECIFIED SITE Left Anterior Mandible Mohs surgery  Consent obtained: written  Anticoagulation: Is the patient taking prescription anticoagulant and/or aspirin prescribed/recommended by a physician? Yes   Was the anticoagulation regimen changed prior to Mohs? No    Anesthesia: Anesthesia method: local infiltration Local anesthetic: lidocaine  1% WITH epi  Procedure Details: Timeout: pre-procedure verification complete Procedure Prep: patient was prepped and draped in usual sterile fashion Prep type: chlorhexidine Pre-Op diagnosis: basal cell carcinoma BCC subtype: nodular MohsAIQ Surgical site (if tumor spans multiple areas, please select predominant area): cheek (including jawline) Surgery side: left Surgical site (from skin exam): Left Anterior Mandible Pre-operative length (cm): 0.8 Pre-operative width (cm): 0.9 Indications for Mohs surgery: anatomic location where tissue conservation is critical  Micrographic Surgery Details: Post-operative length (cm): 2 Post-operative width (cm): 1.6 Number of Mohs stages: 1  Skin repair Complexity:   Complex Final length (cm):  6 Informed consent: discussed and consent obtained   Timeout: patient name, date of birth, surgical site, and procedure verified   Procedure prep:  Patient was prepped and draped in usual sterile fashion Prep type:  Chlorhexidine Anesthesia: the lesion was anesthetized in a standard fashion   Anesthetic:  1% lidocaine  w/ epinephrine  1-100,000 buffered w/ 8.4% NaHCO3 Reason for type of repair: reduce tension to allow closure, avoid adjacent structures, allow side-to-side closure without requiring a flap or graft and compensate for the inelasticity of skin in this area   Undermining: area extensively undermined   Subcutaneous layers (deep stitches):  Suture size:  5-0 Suture type: Monocryl (poliglecaprone 25)   Stitches:  Buried vertical mattress Fine/surface layer approximation (top stitches):  Suture size:  6-0 Suture type: fast-absorbing plain gut   Stitches: simple interrupted   Hemostasis achieved with: suture, pressure and electrodesiccation Outcome: patient tolerated procedure well with no complications   Post-procedure details: sterile dressing applied and wound care instructions given   Dressing type: bandage and pressure dressing     Return in about 4 weeks (around 09/26/2023) for wound check.   08/29/2023  HISTORY OF PRESENT ILLNESS  Sheamus C Maya [name] is seen in consultation at the request of Dr. Felipe Horton for biopsy-proven Nodular Basal Cell Carcinoma on the left mandible. They note that the area has been present for about 6 months increasing in size with time.  There is no history of previous treatment.  Reports no other new or changing lesions and has no other complaints today.  Medications and allergies: see patient chart.  Review of systems: Reviewed 8 systems and notable for the above skin cancer.  All other systems reviewed are unremarkable/negative, unless noted in the HPI. Past medical history, surgical history, family  history, social  history were also reviewed and are noted in the chart/questionnaire.    PHYSICAL EXAMINATION  General: Well-appearing, in no acute distress, alert and oriented x 4. Vitals reviewed in chart (if available).   Skin: Exam reveals a 0.8 x 0.9 cm erythematous papule and biopsy scar on the left mandible. There are rhytids, telangiectasias, and lentigines, consistent with photodamage.   Biopsy report(s) reviewed, confirming the diagnosis.   ASSESSMENT  1) Nodular Basal Cell Carcinoma of the left mandible 2) photodamage 3) solar lentigines   PLAN   1. Due to location, size, histology, or recurrence and the likelihood of subclinical extension as well as the need to conserve normal surrounding tissue, the patient was deemed acceptable for Mohs micrographic surgery (MMS).  The nature and purpose of the procedure, associated benefits and risks including recurrence and scarring, possible complications such as pain, infection, and bleeding, and alternative methods of treatment if appropriate were discussed with the patient during consent. The lesion location was verified by the patient, by reviewing previous notes, pathology reports, and by photographs as well as angulation measurements if available.  Informed consent was reviewed and signed by the patient, and timeout was performed at 10:30 AM. See op note below.  2. For the photodamage and solar lentigines, sun protection discussed/information given on OTC sunscreens, and we recommend continued regular follow-up with primary dermatologist every 6 months or sooner for any growing, bleeding, or changing lesions. 3. Prognosis and future surveillance discussed. 4. Letter with treatment outcome sent to referring provider. 5. Pain acetaminophen     MOHS MICROGRAPHIC SURGERY AND RECONSTRUCTION  Initial size:   0.8 x 0.9 cm Surgical defect/wound size: 2.0 x 1.6 cm Anesthesia:    0.33% lidocaine  with 1:200,000 epinephrine  EBL:    <5  mL Complications:  None Repair type:   Complex SQ suture:   5-0 Monocryl Cutaneous suture:  6-0 Plain gut Final size of the repair: 6.0 cm  Stages: 1  STAGE I: Anesthesia achieved with 0.5% lidocaine  with 1:200,000 epinephrine . ChloraPrep applied. 1 section(s) excised using Mohs technique (this includes total peripheral and deep tissue margin excision and evaluation with frozen sections, excised and interpreted by the same physician). The tumor was first debulked and then excised with an approx. 2mm margin.  Hemostasis was achieved with electrocautery as needed.  The specimen was then oriented, subdivided/relaxed, inked, and processed using Mohs technique.    Frozen section analysis revealed a clear deep and peripheral margin.  Reconstruction  The surgical wound was then cleaned, prepped, and re-anesthetized as above. Wound edges were undermined extensively along at least one entire edge and at a distance equal to or greater than the width of the defect (see wound defect size above) in order to achieve closure and decrease wound tension and anatomic distortion. Redundant tissue repair including standing cone removal was performed. Hemostasis was achieved with electrocautery. Subcutaneous and epidermal tissues were approximated with the above sutures. The surgical site was then lightly scrubbed with sterile, saline-soaked gauze. The area was then bandaged using Vaseline ointment, non-adherent gauze, gauze pads, and tape to provide an adequate pressure dressing. The patient tolerated the procedure well, was given detailed written and verbal wound care instructions, and was discharged in good condition.   The patient will follow-up: 4 weeks.   I, Wilson Hasten, CMA, am acting as scribe for Deneise Finlay, MD.   Documentation: I have reviewed the above documentation for accuracy and completeness, and I agree with the above.  Calem Cocozza M  Linet Brash, MD

## 2023-08-29 NOTE — Patient Instructions (Signed)

## 2023-08-31 ENCOUNTER — Encounter: Payer: Self-pay | Admitting: Dermatology

## 2023-09-22 ENCOUNTER — Other Ambulatory Visit: Payer: Self-pay

## 2023-09-22 DIAGNOSIS — N131 Hydronephrosis with ureteral stricture, not elsewhere classified: Secondary | ICD-10-CM

## 2023-09-23 LAB — BASIC METABOLIC PANEL WITH GFR
BUN/Creatinine Ratio: 19 (ref 10–24)
BUN: 20 mg/dL (ref 10–36)
CO2: 21 mmol/L (ref 20–29)
Calcium: 9.6 mg/dL (ref 8.6–10.2)
Chloride: 102 mmol/L (ref 96–106)
Creatinine, Ser: 1.06 mg/dL (ref 0.76–1.27)
Glucose: 91 mg/dL (ref 70–99)
Potassium: 5.3 mmol/L — ABNORMAL HIGH (ref 3.5–5.2)
Sodium: 139 mmol/L (ref 134–144)
eGFR: 64 mL/min/{1.73_m2} (ref >59)

## 2023-09-26 ENCOUNTER — Ambulatory Visit (INDEPENDENT_AMBULATORY_CARE_PROVIDER_SITE_OTHER): Payer: Self-pay | Admitting: Urology

## 2023-09-26 ENCOUNTER — Encounter: Payer: Self-pay | Admitting: Dermatology

## 2023-09-26 ENCOUNTER — Encounter: Payer: Self-pay | Admitting: Urology

## 2023-09-26 ENCOUNTER — Ambulatory Visit (INDEPENDENT_AMBULATORY_CARE_PROVIDER_SITE_OTHER): Admitting: Dermatology

## 2023-09-26 VITALS — BP 134/80 | HR 88

## 2023-09-26 VITALS — BP 159/94 | HR 83 | Ht 63.0 in | Wt 171.0 lb

## 2023-09-26 DIAGNOSIS — Z85828 Personal history of other malignant neoplasm of skin: Secondary | ICD-10-CM

## 2023-09-26 DIAGNOSIS — N135 Crossing vessel and stricture of ureter without hydronephrosis: Secondary | ICD-10-CM

## 2023-09-26 DIAGNOSIS — C4491 Basal cell carcinoma of skin, unspecified: Secondary | ICD-10-CM

## 2023-09-26 DIAGNOSIS — L539 Erythematous condition, unspecified: Secondary | ICD-10-CM | POA: Diagnosis not present

## 2023-09-26 DIAGNOSIS — L905 Scar conditions and fibrosis of skin: Secondary | ICD-10-CM

## 2023-09-26 NOTE — Patient Instructions (Signed)

## 2023-09-26 NOTE — Progress Notes (Signed)
 [place]pet[place] Elling T. Gorman Safi, MD, CAQ Sports Medicine [facility] at [facility] [address] [place], [address]  Phone: [phone]  FAX: [phone]  Joseph Osborn - 89 y.o. male  MRN [id number]  Date of Birth: [birth year]  Date: 09/27/2023  PCP: [name], MD  Referral: [name], MD  Chief Complaint  Patient presents with   Swollen Eye    Left   Subjective:   Joseph Osborn is a 89 y.o. very pleasant male patient with Body mass index is 29.42 kg/m. who presents with the following:  Is a very pleasant patient who I recall from prior office visit seeing him for knee pain, and presents with some left-sided eye swelling.  2 days ago, the patient awoke with some pain on the left side.  He has had some puffiness, but he describes pain at the actual eye itself.  He does have some mild swelling in the lower lid.  He does not have any kind of known trauma or injury.  He is seeing okay and has really no changes to how he is visualizing things in the distance as well as reading.  There is some mild irritation in the conjunctiva on the left.  He is accompanied by his daughter, who helps with translation from [place] to [place].  Review of Systems is noted in the HPI, as appropriate  Objective:   BP 130/80   Pulse 81   Temp 98.8 F (37.1 C) (Temporal)   Ht 5\' 4"  (1.626 m)   Wt 171 lb 6 oz (77.7 kg)   SpO2 96%   BMI 29.42 kg/m   GEN: No acute distress; alert,appropriate. PULM: Breathing comfortably in no respiratory distress PSYCH: Normally interactive.   Pupils equal round reactive to light and accommodation extraocular movements are intact  Mild swelling on the inferior eyelid  Using fluorescein and a Woods lamp, the patient does have a small lateral abrasion  Laboratory and Imaging Data:  Assessment and Plan:     ICD-10-CM   1. Corneal abrasion, left, initial encounter  S05.02XA     2. Eye swelling, left   H57.89      Small corneal abrasion visualized on fluorescein exam.  I am going to have the place and use some Ocuflox, and this will likely heal in short order.  I reviewed precautions with the patient and his daughter.  If he gets significantly worse, then ophthalmological consult is appropriate, however I do think this is going to heal just fine.  Medication Management during today's office visit: Meds ordered this encounter  Medications   ofloxacin (OCUFLOX) 0.3 % ophthalmic solution    Sig: Place 1 drop into the left eye 4 (four) times daily.    Dispense:  5 mL    Refill:  0   There are no discontinued medications.  Orders placed today for conditions managed today: No orders of the defined types were placed in this encounter.   Disposition: No follow-ups on file.  Dr[employer] One speech-to-text software was used for transcription in this dictation.  Possible transcriptional errors can occur using Animal nutritionist.   Signed,  Ranny Bye. Leaira Fullam, MD   Outpatient Encounter Medications as of 09/27/2023  Medication Sig   acetaminophen  (TYLENOL ) 500 MG tablet Take 2 tablets (1,000 mg total) by mouth in the morning, at noon, and at bedtime.   Cholecalciferol (VITAMIN D3) 25 MCG (1000 UT) capsule Take 1 capsule (1,000 Units total) by mouth  daily.   Ipratropium-Albuterol  (COMBIVENT ) 20-100 MCG/ACT AERS respimat Inhale 1 puff into the lungs every 6 (six) hours as needed for wheezing or shortness of breath.   linaclotide  (LINZESS ) 72 MCG capsule Take 1 capsule (72 mcg total) by mouth daily before breakfast.   Multiple Vitamin (MULTIVITAMIN WITH MINERALS) TABS tablet Take 1 tablet by mouth daily.   ofloxacin (OCUFLOX) 0.3 % ophthalmic solution Place 1 drop into the left eye 4 (four) times daily.   rivaroxaban  (XARELTO ) 20 MG TABS tablet Take 1 tablet (20 mg total) by mouth daily with supper.   vitamin C (VITAMIN C) 500 MG tablet Take 1 tablet (500 mg total) by mouth daily.   Zinc   220 (50 Zn) MG CAPS 1 tablet daily   No facility-administered encounter medications on file as of 09/27/2023.

## 2023-09-26 NOTE — Progress Notes (Signed)
 I,[name],acting as a scribe for Joseph Gimenez, MD.,have documented all relevant documentation on the behalf of Joseph Gimenez, MD,as directed by  Joseph Gimenez, MD while in the presence of Joseph Gimenez, MD.  09/26/2023 12:40 PM   Joseph Osborn 09-Jun-1925 [id number]  Referring provider: [name], MD [address] 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   Accompanied by daughter.    Mid last year, he experienced a submassive P.E. requiring thrombectomy, and after a goals of care discussion, conservative management was elected. This included renal ultrasounds, labs, and cytology, which was negative. His creatinine level is stable at 1.06, unchanged from the previous baseline.   Since the last visit, he has been to the emergency room on several occasions due to nosebleeds and falls.   He is using a Nurse, learning disability for the visit today. He is doing well overall with no new symptoms or complaints.  PMH: Past Medical History:  Diagnosis Date   Arthritis    Basal cell carcinoma 02/04/2021   L groin, excised 03/23/21   BCC (basal cell carcinoma) 08/03/2023   left mandible, refer for Mohs   BPH (benign prostatic hyperplasia)    CAD (coronary artery disease)    Chronic kidney disease    Stage III   Community acquired pneumonia 04/21/2021   Post COVID CAP s/p hospitalization   COPD (chronic obstructive pulmonary disease) (HCC)    COVID-19 04/13/2021   DDD (degenerative disc disease), cervical    DVT (deep venous thrombosis) (HCC) 05/30/2022   Left leg   History of deep vein thrombosis (DVT) of lower extremity 05/30/2022   Venous US  05/30/2022: nearly occlusive to occlusive thrombus extending from left proximal femoral vein to popliteal vein, calf  veins poorly seen with occlusive thrombus to left peroneal and nonocclusive thrombus to left posterior tibial vein.   Bilateral PEs 11/2022     Hypertension    Lipoma 2017   Lower back pain    Myocardial infarction (HCC)    approx 2000   Pneumonia due to COVID-19 virus 01/07/2023   hospitalized   Pulmonary embolism and infarction (HCC) 12/07/2022   hospitalized   Thyroid  disease    Hypothyroidism   Wears dentures    partial upper    Surgical History: Past Surgical History:  Procedure Laterality Date   BLADDER SURGERY  03/2008   bladder polyp removed [name])   CARDIAC CATHETERIZATION  2000   1 stent placed after MI   CATARACT EXTRACTION W/ INTRAOCULAR LENS  IMPLANT, BILATERAL  2014   [facility], Dr. Merrell Abate   CATARACT EXTRACTION W/PHACO Left 09/20/2022   Procedure: CATARACT EXTRACTION PHACO AND INTRAOCULAR LENS PLACEMENT (IOC) LEFT  9.43  00:57.3;  Surgeon: Clair Crews, MD;  Location: [facility];  Service: Ophthalmology;  Laterality: Left;   CHOLECYSTECTOMY     In [place]   COLONOSCOPY  03/13/2014   [facility], Dr. Felicita Horns   COLONOSCOPY WITH PROPOFOL  N/A 03/03/2016   Procedure: COLONOSCOPY WITH PROPOFOL ;  Surgeon: Marnee Sink, MD;  Location: [facility];  Service: Endoscopy;  Laterality: N/A;   ESOPHAGOGASTRODUODENOSCOPY (EGD) WITH PROPOFOL  N/A 03/03/2016   Procedure: ESOPHAGOGASTRODUODENOSCOPY (EGD) WITH PROPOFOL ;  Surgeon: Marnee Sink, MD;  Location: [facility];  Service: Endoscopy;  Laterality: N/A;  Interpreter needed  HEMORRHOID SURGERY     [place], [place]   TRANSURETHRAL RESECTION OF PROSTATE  03/24/2008   Dr Sullivan Endow, [facility]    Home Medications:  Allergies as of 09/26/2023       Reactions   Shellfish Allergy Anaphylaxis   Oxybutynin Other (See Comments)   Doesn't remember reaction   Statins Other (See Comments)   Statin intolerance per cardiology notes        Medication List        Accurate as of Sep 26, 2023 12:40  PM. If you have any questions, ask your nurse or doctor.          STOP taking these medications    polyethylene glycol powder 17 GM/SCOOP powder Commonly known as: GLYCOLAX /MIRALAX  Stopped by: Joseph Osborn       TAKE these medications    acetaminophen  500 MG tablet Commonly known as: TYLENOL  Take 2 tablets (1,000 mg total) by mouth in the morning, at noon, and at bedtime.   ascorbic acid  500 MG tablet Commonly known as: VITAMIN C Take 1 tablet (500 mg total) by mouth daily.   Ipratropium-Albuterol  20-100 MCG/ACT Aers respimat Commonly known as: COMBIVENT  Inhale 1 puff into the lungs every 6 (six) hours as needed for wheezing or shortness of breath.   linaclotide  72 MCG capsule Commonly known as: LINZESS  Take 1 capsule (72 mcg total) by mouth daily before breakfast.   multivitamin with minerals Tabs tablet Take 1 tablet by mouth daily.   rivaroxaban  20 MG Tabs tablet Commonly known as: XARELTO  Take 1 tablet (20 mg total) by mouth daily with supper.   Vitamin D3 25 MCG (1000 UT) Caps Take 1 capsule (1,000 Units total) by mouth daily.   Zinc  220 (50 Zn) MG Caps 1 tablet daily        Allergies:  Allergies  Allergen Reactions   Shellfish Allergy Anaphylaxis   Oxybutynin Other (See Comments)    Doesn't remember reaction   Statins Other (See Comments)    Statin intolerance per cardiology notes    Family History: Family History  Problem Relation Age of Onset   Heart disease Mother    Heart attack Mother    Liver cancer Cousin    Diabetes Neg Hx     Social History:  reports that he has never smoked. He has never used smokeless tobacco. He reports that he does not currently use alcohol. He reports that he does not use drugs.   Physical Exam: BP (!) 159/94   Pulse 83   Ht 5\' 3"  (1.6 m)   Wt 171 lb (77.6 kg)   BMI 30.29 kg/m   Constitutional:  Alert and oriented, No acute distress. HEENT: [place] AT, moist mucus membranes.  Trachea midline, no  masses. Neurologic: Grossly intact, no focal deficits, moving all 4 extremities. Psychiatric: Normal mood and affect.   Assessment & Plan:    1. Left UVJ obstruction  - The etiology of the hydroureteronephrosis remains unclear, and given his age and comorbidities, the risks of intervention outweigh the benefits. He is not a candidate for any diagnostic or surgical intervention at this time. The plan is to follow up as needed, with specific attention to any blood in the urine or severe flank pain as reasons to return. Will inform his PCP of the findings today as well.   Return if symptoms worsen or fail to improve.  I have reviewed the above documentation for accuracy and completeness, and I agree with the above.   Odilia Bennett  Ace Holder, MD   [facility] [address] [place], [place] [address] [phone]

## 2023-09-26 NOTE — Progress Notes (Signed)
   Follow Up Visit   Subjective  Joseph Osborn Joseph Osborn is a 89 y.o. male who presents for the following: follow up from Mohs surgery   The patient presents for follow up from Mohs surgery for a BCC on the left anterior mandible, treated on 08/29/23, repaired with linear closure. The patient has been bandaging the wound as directed. The endorse the following concerns: No questions or concerns at this time. Here with daughter and spanish interpreter.   The following portions of the chart were reviewed this encounter and updated as appropriate: medications, allergies, medical history  Review of Systems:  No other skin or systemic complaints except as noted in HPI or Assessment and Plan.  Objective  Well appearing patient in no apparent distress; mood and affect are within normal limits.  A full examination was performed including scalp, head, and face. All findings within normal limits unless otherwise noted below.  Healing wound with mild erythema  Relevant physical exam findings are noted in the Assessment and Plan.    Assessment & Plan    Healing s/p Mohs for [name], treated on 08/29/23, repaired with linear closure. - Reassured that wound is healing well - No evidence of infection - No swelling, induration, purulence, dehiscence, or tenderness out of proportion to the clinical exam, see photo above - Discussed that scars take up to 12 months to mature from the date of surgery - Recommend SPF 30+ to scar daily to prevent purple color from UV exposure during scar maturation process - Discussed that erythema and raised appearance of scar will fade over the next 4-6 months - OK to start scar massage at 4-6 weeks post-op - Can consider silicone based products for scar healing starting at 6 weeks post-op - Ok to discontinue ointment daily to wound.  HISTORY OF BASAL CELL CARCINOMA OF THE SKIN - No evidence of recurrence today - Recommend regular full body skin exams - Recommend daily  broad spectrum sunscreen SPF 30+ to sun-exposed areas, reapply every 2 hours as needed.  - Call if any new or changing lesions are noted between office visits     Return if symptoms worsen or fail to improve.  I, Haig Levan, Surg Tech III, am acting as scribe for Deneise Finlay, MD.   Documentation: I have reviewed the above documentation for accuracy and completeness, and I agree with the above.  Deneise Finlay, MD

## 2023-09-27 ENCOUNTER — Encounter: Payer: Self-pay | Admitting: Family Medicine

## 2023-09-27 ENCOUNTER — Ambulatory Visit (INDEPENDENT_AMBULATORY_CARE_PROVIDER_SITE_OTHER): Admitting: Family Medicine

## 2023-09-27 VITALS — BP 130/80 | HR 81 | Temp 98.8°F | Ht 64.0 in | Wt 171.4 lb

## 2023-09-27 DIAGNOSIS — S0502XA Injury of conjunctiva and corneal abrasion without foreign body, left eye, initial encounter: Secondary | ICD-10-CM

## 2023-09-27 DIAGNOSIS — H5789 Other specified disorders of eye and adnexa: Secondary | ICD-10-CM | POA: Diagnosis not present

## 2023-09-27 MED ORDER — OFLOXACIN 0.3 % OP SOLN: 1.0000 [drp] | Freq: Four times a day (QID) | OPHTHALMIC | 0 refills | Status: DC

## 2023-10-11 ENCOUNTER — Encounter: Payer: Self-pay | Admitting: Dermatology

## 2023-11-13 ENCOUNTER — Encounter: Payer: Self-pay | Admitting: Family Medicine

## 2023-11-13 ENCOUNTER — Ambulatory Visit: Payer: Self-pay | Admitting: *Deleted

## 2023-11-13 ENCOUNTER — Ambulatory Visit (INDEPENDENT_AMBULATORY_CARE_PROVIDER_SITE_OTHER): Admitting: Family Medicine

## 2023-11-13 VITALS — BP 114/66 | HR 92 | Temp 97.7°F | Ht 64.0 in | Wt 172.0 lb

## 2023-11-13 DIAGNOSIS — R300 Dysuria: Secondary | ICD-10-CM | POA: Diagnosis not present

## 2023-11-13 DIAGNOSIS — R310 Gross hematuria: Secondary | ICD-10-CM | POA: Diagnosis not present

## 2023-11-13 DIAGNOSIS — R109 Unspecified abdominal pain: Secondary | ICD-10-CM

## 2023-11-13 DIAGNOSIS — N135 Crossing vessel and stricture of ureter without hydronephrosis: Secondary | ICD-10-CM

## 2023-11-13 DIAGNOSIS — J310 Chronic rhinitis: Secondary | ICD-10-CM

## 2023-11-13 DIAGNOSIS — R140 Abdominal distension (gaseous): Secondary | ICD-10-CM

## 2023-11-13 LAB — POC URINALSYSI DIPSTICK (AUTOMATED)
Bilirubin, UA: NEGATIVE
Glucose, UA: NEGATIVE
Ketones, UA: NEGATIVE
Nitrite, UA: NEGATIVE
Protein, UA: POSITIVE — AB
Spec Grav, UA: 1.015
Urobilinogen, UA: 0.2 U/dL
pH, UA: 5.5

## 2023-11-13 MED ORDER — CEPHALEXIN 500 MG PO CAPS: 500.0000 mg | ORAL_CAPSULE | Freq: Two times a day (BID) | ORAL | 0 refills | Status: DC

## 2023-11-13 NOTE — Patient Instructions (Addendum)
 Si tiene sangre en la orina - puede ser del bloqueo en la via urinaria izquierda. He mandado cultura para ver si hay infeccion. Meintras tanto, comienze antibiotico keflex  dos veces al dia por 7 dias.   Puede tratar pribiotic Align para gas O puede tratar Atrantil (ambos sin receta)

## 2023-11-13 NOTE — Telephone Encounter (Signed)
 Copied from CRM [id number]. Topic: Clinical - Pink Word Triage >> Nov 13, 2023  8:28 AM Henretta I wrote: Reason for Triage: Patient's daughter is calling in because patient is having very dark colored urine. [phone] is good callback number. Reason for Disposition  Side (flank) or lower back pain present  Answer Assessment - Initial Assessment Questions 1. SYMPTOM: What's the main symptom you're concerned about? (e.g., frequency, incontinence)     Dark colored urine 2. ONSET: When did the  symptoms  start?     yesterday 3. PAIN: Is there any pain? If Yes, ask: How bad is it? (Scale: 1-10; mild, moderate, severe)     Abdominal pain 4. CAUSE: What do you think is causing the symptoms?     Possible dehydration- intake decreased 5. OTHER SYMPTOMS: Do you have any other symptoms? (e.g., blood in urine, fever, flank pain, pain with urination)     Hurts with little urine, back pain-flank  Protocols used: Urinary Symptoms-A-AH

## 2023-11-13 NOTE — Telephone Encounter (Signed)
 Noted. Will see today with [facility].

## 2023-11-13 NOTE — Telephone Encounter (Signed)
Fyi for appt today.

## 2023-11-13 NOTE — Progress Notes (Unsigned)
 Ph: [phone] Fax: [phone]   Patient ID: Joseph Osborn, male    DOB: 06/14/1925, 89 y.o.   MRN: [id number]  This visit was conducted in person.  BP 114/66   Pulse 92   Temp 97.7 F (36.5 C) (Oral)   Ht 5' 4 (1.626 m)   Wt 172 lb (78 kg)   SpO2 99%   BMI 29.52 kg/m    CC: flank pain on left Subjective:   HPI: Joseph Osborn is a 89 y.o. male presenting on 11/13/2023 for Flank Pain (C/o L flank pain and occasional pain with urination. Sxs started this AM. Pt accompanied by daughter, Ezella. 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 Last saw Dr Penne urology 09/26/2023 - at that time given age and comorbidities no further evaluation was recommended. F/u PRN, with return if blood in urine or recurrent flank pain developed. Renal US  02/2023 showed L hydroureteronephrosis. I don't see where urine cytology was done.   Today presents with 1d h/o L flank pain, dark urine, intermittent urethral dysuria. Treated with tylenol  and laying down with benefit.  No fevers/chills, nausea, vomiting, diarrhea.   Notes ongoing gas bloating and ongoing nasal rhinorrhea/mucous production which is very bothersome.   Constipation overall managed adequately with Linzess .      Relevant past medical, surgical, family and social history reviewed and updated as indicated. Interim medical history since our last visit reviewed. Allergies and medications reviewed and updated. Outpatient Medications Prior to Visit  Medication Sig Dispense Refill   acetaminophen  (TYLENOL ) 500 MG tablet Take 2 tablets (1,000 mg total) by mouth in the morning, at noon, and at bedtime.     Cholecalciferol (VITAMIN D3) 25 MCG (1000 UT) capsule Take 1 capsule (1,000 Units total) by mouth daily. 30 capsule    Ipratropium-Albuterol  (COMBIVENT ) 20-100 MCG/ACT AERS respimat Inhale 1 puff into the lungs every 6 (six) hours as needed for wheezing or shortness of  breath. 4 g 1   linaclotide  (LINZESS ) 72 MCG capsule Take 1 capsule (72 mcg total) by mouth daily before breakfast. 90 capsule 3   Multiple Vitamin (MULTIVITAMIN WITH MINERALS) TABS tablet Take 1 tablet by mouth daily. 90 tablet 0   ofloxacin  (OCUFLOX ) 0.3 % ophthalmic solution Place 1 drop into the left eye 4 (four) times daily. 5 mL 0   rivaroxaban  (XARELTO ) 20 MG TABS tablet Take 1 tablet (20 mg total) by mouth daily with supper. 90 tablet 3   vitamin C (VITAMIN C) 500 MG tablet Take 1 tablet (500 mg total) by mouth daily. 30 tablet 0   Zinc  220 (50 Zn) MG CAPS 1 tablet daily 30 capsule 0   No facility-administered medications prior to visit.     Per HPI unless specifically indicated in ROS section below Review of Systems  Objective:  BP 114/66   Pulse 92   Temp 97.7 F (36.5 C) (Oral)   Ht 5' 4 (1.626 m)   Wt 172 lb (78 kg)   SpO2 99%   BMI 29.52 kg/m   Wt Readings from Last 3 Encounters:  11/13/23 172 lb (78 kg)  09/27/23 171 lb 6 oz (77.7 kg)  09/26/23 171 lb (77.6 kg)      Physical Exam Vitals and nursing note reviewed.  Constitutional:      Appearance: He is well-developed. He is not ill-appearing.  HENT:     Head: Normocephalic and atraumatic.  Mouth/Throat:     Mouth: Mucous membranes are moist.     Pharynx: Oropharynx is clear. No oropharyngeal exudate or posterior oropharyngeal erythema.  Eyes:     Extraocular Movements: Extraocular movements intact.     Pupils: Pupils are equal, round, and reactive to light.  Cardiovascular:     Rate and Rhythm: Normal rate and regular rhythm.     Pulses: Normal pulses.     Heart sounds: Normal heart sounds. No murmur heard. Pulmonary:     Effort: Pulmonary effort is normal. No respiratory distress.     Breath sounds: Normal breath sounds. No wheezing, rhonchi or rales.  Abdominal:     General: Bowel sounds are normal. There is no distension.     Palpations: Abdomen is soft. There is no mass.     Tenderness: There  is no abdominal tenderness. There is no right CVA tenderness, left CVA tenderness, guarding or rebound.     Hernia: No hernia is present.  Musculoskeletal:     Cervical back: Normal range of motion and neck supple. No tenderness.     Right lower leg: No edema.     Left lower leg: No edema.     Comments: R knee brace present  Skin:    General: Skin is warm and dry.     Findings: No rash.  Neurological:     Mental Status: He is alert.  Psychiatric:        Mood and Affect: Mood normal.        Behavior: Behavior normal.       Results for orders placed or performed in visit on 11/13/23  POCT Urinalysis Dipstick (Automated)   Collection Time: 11/13/23  3:44 PM  Result Value Ref Range   Color, UA yellow    Clarity, UA clear    Glucose, UA Negative Negative   Bilirubin, UA negative    Ketones, UA negative    Spec Grav, UA 1.015 1.010 - 1.025   Blood, UA 3+    pH, UA 5.5 5.0 - 8.0   Protein, UA Positive (A) Negative   Urobilinogen, UA 0.2 0.2 or 1.0 E.U./dL   Nitrite, UA negative    Leukocytes, UA Moderate (2+) (A) Negative   Lab Results  Component Value Date   NA 139 09/22/2023   CL 102 09/22/2023   K 5.3 (H) 09/22/2023   CO2 21 09/22/2023   BUN 20 09/22/2023   CREATININE 1.06 09/22/2023   EGFR 64 09/22/2023   CALCIUM 9.6 09/22/2023   PHOS 3.0 08/15/2023   ALBUMIN 4.3 08/15/2023   GLUCOSE 91 09/22/2023    Assessment & Plan:   Problem List Items Addressed This Visit     Chronic rhinitis   Limited in options.  Oral antihistamines ineffective. Trouble tolerating INS.  Atrovent  and astelin  caused tinnitus.  Also has had h/o epistaxis in [facility] use.  Will monitor for now, continue nasal saline irrigation. Consider ENT eval.       Gassiness   Chronic issue. Has tried GasX, avoiding lactose.  Discussed trial of align probiotic vs Atrantil pre/probiotic, both OTC.      Acquired ureterovesical junction (UVJ) obstruction   Acute left flank pain   See above.        Gross hematuria - Primary   Dark urine, UA with large amt RBC, associated with L flank pain and mild urethral dysuria in known h/o chronic L UVJ obstruction with resultant hydroureteronephrosis. Symptoms could be related to known L UVJ obstruction  of unclear etiology. Previous urological evaluation no further intervention recommended. WASP for keflex  provided with indications when to fill. UCx sent, pending results. If ongoing despite above, low threshold to return to urology vs rpt CT urogram for further evaluation.       Other Visit Diagnoses       Dysuria       Relevant Orders   POCT Urinalysis Dipstick (Automated) (Completed)   Urine Culture        Meds ordered this encounter  Medications   cephALEXin  (KEFLEX ) 500 MG capsule    Sig: Take 1 capsule (500 mg total) by mouth 2 (two) times daily.    Dispense:  14 capsule    Refill:  0    Orders Placed This Encounter  Procedures   Urine Culture   POCT Urinalysis Dipstick (Automated)    Patient Instructions  Si tiene sangre en la orina - puede ser del bloqueo en la via urinaria izquierda. He mandado cultura para ver si hay infeccion. Meintras tanto, comienze antibiotico keflex  dos veces al dia por 7 dias.   Puede tratar pribiotic Align para gas O puede tratar Atrantil (ambos sin receta)  Follow up plan: No follow-ups on file.  Anton Blas, MD

## 2023-11-13 NOTE — Telephone Encounter (Signed)
 FYI Only or Action Required?: FYI only for provider.  Patient was last seen in primary care on 09/27/2023 by Watt Mirza, MD. Called Nurse Triage reporting dark urine (Abdominal pain, dark urine, no appetite ). Symptoms began yesterday. Interventions attempted: Nothing. Symptoms are: unchanged.  Triage Disposition: See Physician Within 24 Hours  Patient/caregiver understands and will follow disposition?: Yes

## 2023-11-14 ENCOUNTER — Encounter: Payer: Self-pay | Admitting: Family Medicine

## 2023-11-14 DIAGNOSIS — R310 Gross hematuria: Secondary | ICD-10-CM | POA: Insufficient documentation

## 2023-11-14 LAB — URINE CULTURE
MICRO NUMBER:: 16665371
Result:: NO GROWTH
SPECIMEN QUALITY:: ADEQUATE

## 2023-11-14 NOTE — Assessment & Plan Note (Signed)
 See above

## 2023-11-14 NOTE — Assessment & Plan Note (Addendum)
 Limited in options.  Oral antihistamines ineffective. Trouble tolerating INS.  Atrovent  and astelin  caused tinnitus.  Also has had h/o epistaxis in [facility] use.  Will monitor for now, continue nasal saline irrigation. Consider ENT eval.

## 2023-11-14 NOTE — Assessment & Plan Note (Signed)
 Chronic issue. Has tried GasX, avoiding lactose.  Discussed trial of align probiotic vs Atrantil pre/probiotic, both OTC.

## 2023-11-14 NOTE — Assessment & Plan Note (Addendum)
 Dark urine, UA with large amt RBC, associated with L flank pain and mild urethral dysuria in known h/o chronic L UVJ obstruction with resultant hydroureteronephrosis. Symptoms could be related to known L UVJ obstruction of unclear etiology. Previous urological evaluation no further intervention recommended. WASP for keflex  provided with indications when to fill. UCx sent, pending results. If ongoing despite above, low threshold to return to urology vs rpt CT urogram for further evaluation.

## 2023-11-17 ENCOUNTER — Ambulatory Visit: Payer: Self-pay | Admitting: Family Medicine

## 2023-11-17 NOTE — Telephone Encounter (Signed)
 Copied from CRM [id number]. Topic: Clinical - Lab/Test Results >> Nov 17, 2023  9:54 AM Joseph Osborn wrote: Reason for CRM: Calling in to go over information for the missed call  Has some questions can be reached at mobile number

## 2023-11-28 ENCOUNTER — Ambulatory Visit

## 2023-12-05 ENCOUNTER — Other Ambulatory Visit: Payer: Self-pay

## 2023-12-05 MED ORDER — ZOSTER VAC RECOMB ADJUVANTED 50 MCG/0.5ML IM SUSR
0.5000 mL | Freq: Once | INTRAMUSCULAR | 0 refills | Status: AC
  Filled 2023-12-05: qty 0.5, 1d supply, fill #0

## 2024-02-14 ENCOUNTER — Encounter: Payer: Self-pay | Admitting: Family Medicine

## 2024-02-14 ENCOUNTER — Ambulatory Visit: Admitting: Family Medicine

## 2024-02-14 VITALS — BP 130/70 | HR 77 | Temp 98.0°F | Ht 64.0 in | Wt 178.2 lb

## 2024-02-14 DIAGNOSIS — Z23 Encounter for immunization: Secondary | ICD-10-CM | POA: Diagnosis not present

## 2024-02-14 DIAGNOSIS — J310 Chronic rhinitis: Secondary | ICD-10-CM | POA: Diagnosis not present

## 2024-02-14 DIAGNOSIS — H1013 Acute atopic conjunctivitis, bilateral: Secondary | ICD-10-CM | POA: Diagnosis not present

## 2024-02-14 DIAGNOSIS — R310 Gross hematuria: Secondary | ICD-10-CM

## 2024-02-14 DIAGNOSIS — R053 Chronic cough: Secondary | ICD-10-CM

## 2024-02-14 DIAGNOSIS — J449 Chronic obstructive pulmonary disease, unspecified: Secondary | ICD-10-CM

## 2024-02-14 DIAGNOSIS — N135 Crossing vessel and stricture of ureter without hydronephrosis: Secondary | ICD-10-CM

## 2024-02-14 MED ORDER — ERYTHROMYCIN 5 MG/GM OP OINT: 1.0000 | TOPICAL_OINTMENT | Freq: Every day | OPHTHALMIC | 0 refills | Status: DC

## 2024-02-14 NOTE — Patient Instructions (Addendum)
 Vacuna de flu hoy  Lo remitire a otorrhino para evaluacion de rhinitis cronica. Comienze claritin o allegra  diario de nuevo.  Use combivent  diariamente por la maana para tos.  Para ojos - trate ointment con antibiotico he mandado a su farmacia. Siga pataday regularmente durante temporada de alergias y refresh lagrimas artificiales Gusto verlos hoy Me deja saber como le va con esto Regresar en 6 meses para proximo examen fisico

## 2024-02-14 NOTE — Progress Notes (Signed)
 Ph: [phone] Fax: [phone]   Patient ID: Joseph Osborn, male    DOB: [birth year], 89 y.o.   MRN: [id number]  This visit was conducted in person.  BP 130/70   Pulse 77   Temp 98 F (36.7 C) (Oral)   Ht 5' 4 (1.626 m)   Wt 178 lb 4 oz (80.9 kg)   SpO2 97%   BMI 30.60 kg/m    CC: 6 mo f/u visit  Subjective:   HPI: Joseph Osborn is a 89 y.o. male presenting on 02/14/2024 for Medical Management of Chronic Issues (Pt here for  mo f/u/Pt is accompanied by daughter Ezella)   Had a fall on Monday - knees gave out while carrying a platter coming out of his house - fortunately no injury.   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 He did have recurrent nosebleeds, s/p ENT eval with resolution. He declined lower xarelto  dose. Previous h/o rectal bleed (12/2022).   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 Renal US  02/2023 showed L hydroureteronephrosis. Last saw Dr Penne urology 09/26/2023 - at that time given age and comorbidities risks of further evaluation /intervention was felt to outweigh benefits with f/u left PRN, with return if blood in urine or recurrent flank pain developed. He did have an epsidoe of gross hematuria and left flank pain 11/2023, but that quickly resolved and has not had recurrence since.   Notes worsening itchy eyes - managing with pataday and refresh artificial tears with some benefit. Notes discharge at tear ducts L>R. No vision changes.   Chronic phlegm production - did not tolerate previous nasal solutions (tinnitus), poor response to guaifenesin . Longstanding issue, very bothersome.      Relevant past medical, surgical, family and social history reviewed and updated as indicated. Interim medical history since our last visit reviewed. Allergies and medications reviewed  and updated. Outpatient Medications Prior to Visit  Medication Sig Dispense Refill   acetaminophen  (TYLENOL ) 500 MG tablet Take 2 tablets (1,000 mg total) by mouth in the morning, at noon, and at bedtime.     Ipratropium-Albuterol  (COMBIVENT ) 20-100 MCG/ACT AERS respimat Inhale 1 puff into the lungs every 6 (six) hours as needed for wheezing or shortness of breath. 4 g 1   linaclotide  (LINZESS ) 72 MCG capsule Take 1 capsule (72 mcg total) by mouth daily before breakfast. 90 capsule 3   Multiple Vitamin (MULTIVITAMIN WITH MINERALS) TABS tablet Take 1 tablet by mouth daily. 90 tablet 0   rivaroxaban  (XARELTO ) 20 MG TABS tablet Take 1 tablet (20 mg total) by mouth daily with supper. 90 tablet 3   vitamin C (VITAMIN C) 500 MG tablet Take 1 tablet (500 mg total) by mouth daily. 30 tablet 0   Cholecalciferol (VITAMIN D3) 25 MCG (1000 UT) capsule Take 1 capsule (1,000 Units total) by mouth daily. (Patient not taking: Reported on 02/14/2024) 30 capsule    cephALEXin  (KEFLEX ) 500 MG capsule Take 1 capsule (500 mg total) by mouth 2 (two) times daily. (Patient not taking: Reported on 02/14/2024) 14 capsule 0   ofloxacin  (OCUFLOX ) 0.3 % ophthalmic solution Place 1 drop into the left eye 4 (four) times daily. (Patient not taking: Reported on 02/14/2024) 5 mL 0   Zinc  220 (50 Zn) MG CAPS 1 tablet daily (Patient not taking: Reported on 02/14/2024) 30 capsule  0   No facility-administered medications prior to visit.     Per HPI unless specifically indicated in ROS section below Review of Systems  Objective:  BP 130/70   Pulse 77   Temp 98 F (36.7 C) (Oral)   Ht 5' 4 (1.626 m)   Wt 178 lb 4 oz (80.9 kg)   SpO2 97%   BMI 30.60 kg/m   Wt Readings from Last 3 Encounters:  02/14/24 178 lb 4 oz (80.9 kg)  11/13/23 172 lb (78 kg)  09/27/23 171 lb 6 oz (77.7 kg)      Physical Exam Vitals and nursing note reviewed.  Constitutional:      Appearance: Normal appearance. He is not ill-appearing.  HENT:      Head: Normocephalic and atraumatic.     Right Ear: Tympanic membrane and ear canal normal. There is impacted cerumen.     Left Ear: Tympanic membrane and ear canal normal. There is impacted cerumen.     Nose: Congestion and rhinorrhea present.     Right Turbinates: Pale. Not enlarged or swollen.     Left Turbinates: Pale. Not enlarged or swollen.     Mouth/Throat:     Mouth: Mucous membranes are moist.     Pharynx: Oropharynx is clear. No oropharyngeal exudate or posterior oropharyngeal erythema.  Eyes:     Extraocular Movements: Extraocular movements intact.     Pupils: Pupils are equal, round, and reactive to light.      Comments:  Right lower eyelid erythema and swelling with redness extending lateral to eye  No significant bulbar conjunctival erythema R lower eyelid with palpebral conjunctival injection/erythema  Cardiovascular:     Rate and Rhythm: Normal rate and regular rhythm.     Pulses: Normal pulses.     Heart sounds: Normal heart sounds. No murmur heard. Pulmonary:     Effort: Pulmonary effort is normal. No respiratory distress.     Breath sounds: Normal breath sounds. No wheezing, rhonchi or rales.  Musculoskeletal:     Right lower leg: No edema.     Left lower leg: No edema.     Comments: Wearing R knee brace  Skin:    General: Skin is warm and dry.     Findings: No rash.  Neurological:     Mental Status: He is alert.  Psychiatric:        Mood and Affect: Mood normal.        Behavior: Behavior normal.       Lab Results  Component Value Date   NA 139 09/22/2023   CL 102 09/22/2023   K 5.3 (H) 09/22/2023   CO2 21 09/22/2023   BUN 20 09/22/2023   CREATININE 1.06 09/22/2023   EGFR 64 09/22/2023   CALCIUM 9.6 09/22/2023   PHOS 3.0 08/15/2023   ALBUMIN 4.3 08/15/2023   GLUCOSE 91 09/22/2023    Assessment & Plan:   Problem List Items Addressed This Visit     COPD (chronic obstructive pulmonary disease) (HCC)   Suggested more regular combivent  inhaler  use to see if any benefit for cough, congestion.  He didn't feel incruse ellipta  was beneficial so stopped      Chronic cough   Chronic rhinitis - Primary   This is his predominant concern, affecting quality of life. Limited options. Did not tolerated intranasal steroid and has h/o epistaxis on anticoagulation, oral antihistamines ineffective, did not tolerate atrovent  or astelin  nasal sprays (tinnitus).  Continue nasal saline irrigation. Agrees to restart  oral antihistamine.  Will refer to ENT for evaluation.       Relevant Orders   Ambulatory referral to ENT   Acquired ureterovesical junction (UVJ) obstruction   Last saw urology 09/5023, f/u PRN.  Will ask to return for updated UA/renal panel.       Relevant Orders   Renal function panel   Urinalysis, Routine w reflex microscopic   Allergic conjunctivitis   Continue regular pataday, artificial tear use.  Rx romycin ointment for possible infectious component given erythema, swelling noted to R lateral eye.       Gross hematuria   Latest episode 11/2023 associated with left flank pain.  On last uro eval 09/2023, rec against further eval/intervention.  Currently asymptomatic from this standpoint.  If recurrent, low threshold tor return to urology. See above.       Relevant Orders   Renal function panel   Urinalysis, Routine w reflex microscopic   Other Visit Diagnoses       Encounter for immunization       Relevant Orders   Flu vaccine HIGH DOSE PF(Fluzone Trivalent) (Completed)        Meds ordered this encounter  Medications   erythromycin ophthalmic ointment    Sig: Place 1 Application into both eyes at bedtime.    Dispense:  3.5 g    Refill:  0    Orders Placed This Encounter  Procedures   Flu vaccine HIGH DOSE PF(Fluzone Trivalent)   Renal function panel    Standing Status:   Future    Expiration Date:   02/16/2025   Urinalysis, Routine w reflex microscopic    Standing Status:   Future    Expiration  Date:   02/16/2025   Ambulatory referral to ENT    Referral Priority:   Routine    Referral Type:   Consultation    Referral Reason:   Specialty Services Required    Requested Specialty:   Otolaryngology    Number of Visits Requested:   1    Patient Instructions  Jenne de flu hoy  Lo remitire a otorrhino para evaluacion de rhinitis cronica. Comienze claritin o allegra  diario de nuevo.  Use combivent  diariamente por la maana para tos.  Para ojos - trate ointment con antibiotico he mandado a su farmacia. Siga pataday regularmente durante temporada de alergias y refresh lagrimas artificiales Gusto verlos hoy Me deja saber como le va con esto Regresar en 6 meses para proximo examen fisico  Follow up plan: Return in about 6 months (around 08/14/2024) for medicare wellness visit.  Anton Blas, MD

## 2024-02-14 NOTE — Assessment & Plan Note (Addendum)
 Suggested more regular combivent  inhaler use to see if any benefit for cough, congestion.  He didn't feel incruse ellipta  was beneficial so stopped

## 2024-02-17 ENCOUNTER — Encounter: Payer: Self-pay | Admitting: Family Medicine

## 2024-02-17 ENCOUNTER — Telehealth: Payer: Self-pay | Admitting: Family Medicine

## 2024-02-17 NOTE — Telephone Encounter (Signed)
 Plz notify daughter- I'd like to update urine test and kidney function blood test to monitor left kidney blockage issue - plz schedule non-fasting lab visit at their convenience to monitor this.  Has he had any left flank pain or urinary symptoms?

## 2024-02-17 NOTE — Assessment & Plan Note (Addendum)
 This is his predominant concern, affecting quality of life. Limited options. Did not tolerated intranasal steroid and has h/o epistaxis on anticoagulation, oral antihistamines ineffective, did not tolerate atrovent  or astelin  nasal sprays (tinnitus).  Continue nasal saline irrigation. Agrees to restart oral antihistamine.  Will refer to ENT for evaluation.

## 2024-02-17 NOTE — Assessment & Plan Note (Addendum)
 Last saw urology 09/5023, f/u PRN.  Will ask to return for updated UA/renal panel.

## 2024-02-17 NOTE — Assessment & Plan Note (Addendum)
 Continue regular pataday, artificial tear use.  Rx romycin ointment for possible infectious component given erythema, swelling noted to R lateral eye.

## 2024-02-17 NOTE — Assessment & Plan Note (Addendum)
 Latest episode 11/2023 associated with left flank pain.  On last uro eval 09/2023, rec against further eval/intervention.  Currently asymptomatic from this standpoint.  If recurrent, low threshold tor return to urology. See above.

## 2024-02-19 ENCOUNTER — Other Ambulatory Visit (INDEPENDENT_AMBULATORY_CARE_PROVIDER_SITE_OTHER)

## 2024-02-19 DIAGNOSIS — N135 Crossing vessel and stricture of ureter without hydronephrosis: Secondary | ICD-10-CM | POA: Diagnosis not present

## 2024-02-19 DIAGNOSIS — R310 Gross hematuria: Secondary | ICD-10-CM | POA: Diagnosis not present

## 2024-02-19 LAB — URINALYSIS, ROUTINE W REFLEX MICROSCOPIC
Bilirubin Urine: NEGATIVE
Ketones, ur: NEGATIVE
Nitrite: NEGATIVE
Specific Gravity, Urine: 1.015 (ref 1.000–1.030)
Total Protein, Urine: NEGATIVE
Urine Glucose: NEGATIVE
Urobilinogen, UA: 0.2 (ref 0.0–1.0)
pH: 6 (ref 5.0–8.0)

## 2024-02-19 LAB — RENAL FUNCTION PANEL
Albumin: 4.4 g/dL (ref 3.5–5.2)
BUN: 19 mg/dL (ref 6–23)
CO2: 29 meq/L (ref 19–32)
Calcium: 9.5 mg/dL (ref 8.4–10.5)
Chloride: 106 meq/L (ref 96–112)
Creatinine, Ser: 1.1 mg/dL (ref 0.40–1.50)
GFR: 56.61 mL/min — ABNORMAL LOW (ref >60.00)
Glucose, Bld: 95 mg/dL (ref 70–99)
Phosphorus: 2.8 mg/dL (ref 2.3–4.6)
Potassium: 5.1 meq/L (ref 3.5–5.1)
Sodium: 142 meq/L (ref 135–145)

## 2024-02-19 NOTE — Telephone Encounter (Signed)
 Patient is having some pain in left side when he first gets up or sits for long time. He also has noticed increased frequency at times. I have set up for lab appointment today. They are aware that we will need urine and blood.

## 2024-02-23 ENCOUNTER — Ambulatory Visit: Payer: Self-pay | Admitting: Family Medicine

## 2024-03-11 ENCOUNTER — Ambulatory Visit: Payer: Self-pay

## 2024-03-11 NOTE — Telephone Encounter (Signed)
 FYI Only or Action Required?: FYI only for provider: appointment scheduled on 03/12/24.  Patient was last seen in primary care on 02/14/2024 by Rilla Baller, MD.  Called Nurse Triage reporting Sore Throat, Generalized Body Aches, Cough, and Eye Drainage.  Symptoms began several days ago.  Interventions attempted: OTC medications: Robitussin, Tylenol .  Symptoms are: productive cough with clear mucous causing abdominal pain, sore throat, body aches, chills, red and watery eyes gradually worsening.  Triage Disposition: See Physician Within 24 Hours  Patient/caregiver understands and will follow disposition?: Yes               Copied from CRM #[id number]. Topic: Clinical - Red Word Triage >> Mar 11, 2024  2:11 PM Leah C wrote: Red Word that prompted transfer to Nurse Triage:  Throat pain, body aches, heavy coughing-phlegm but is a chronic issue- clear, watery eyes/red, started yesterday Reason for Disposition  [1] Known COPD or other severe lung disease (i.e., bronchiectasis, cystic fibrosis, lung surgery) AND [2] symptoms getting worse (i.e., increased sputum purulence or amount, increased breathing difficulty  Answer Assessment - Initial Assessment Questions 1. ONSET: When did the cough begin?      Saturday.  2. SEVERITY: How bad is the cough today?      The cough is causing abdominal pain from soreness from coughing so much. Daughter describes it as an occasional cough but it is painful.  3. SPUTUM: Describe the color of your sputum (e.g., none, dry cough; clear, white, yellow, green)     Clear.  4. HEMOPTYSIS: Are you coughing up any blood? If Yes, ask: How much? (e.g., flecks, streaks, tablespoons, etc.)     No.  5. DIFFICULTY BREATHING: Are you having difficulty breathing? If Yes, ask: How bad is it? (e.g., mild, moderate, severe)      No.  6. FEVER: Do you have a fever? If Yes, ask: What is your temperature, how was it measured, and when did it  start?     Unsure, has not checked.  7. CARDIAC HISTORY: Do you have any history of heart disease? (e.g., heart attack, congestive heart failure)      CAD with stents, HTN.  8. LUNG HISTORY: Do you have any history of lung disease?  (e.g., pulmonary embolus, asthma, emphysema)     COPD.  9. PE RISK FACTORS: Do you have a history of blood clots? (or: recent major surgery, recent prolonged travel, bedridden)     Yes, he is on Xarelto .  10. OTHER SYMPTOMS: Do you have any other symptoms? (e.g., runny nose, wheezing, chest pain)       Chills, red and watery eyes, body aches, throat burning. Denies nausea, vomiting, diarrhea, headaches.  11. PREGNANCY: Is there any chance you are pregnant? When was your last menstrual period?       N/A.  12. TRAVEL: Have you traveled out of the country in the last month? (e.g., travel history, exposures)       Unsure if any exposure to flu or COVID. She states his wife also had the symptoms last week on Wednesday.  Protocols used: Cough - Acute Productive-A-AH

## 2024-03-11 NOTE — Telephone Encounter (Signed)
 Appreciate Dr Bennett seeing this patient.

## 2024-03-12 ENCOUNTER — Ambulatory Visit

## 2024-03-12 ENCOUNTER — Ambulatory Visit: Payer: Self-pay

## 2024-03-12 VITALS — BP 118/82 | HR 98 | Temp 98.3°F | Ht 64.0 in | Wt 178.0 lb

## 2024-03-12 DIAGNOSIS — J020 Streptococcal pharyngitis: Secondary | ICD-10-CM | POA: Diagnosis not present

## 2024-03-12 LAB — POC COVID19 BINAXNOW: SARS Coronavirus 2 Ag: NEGATIVE

## 2024-03-12 LAB — POCT INFLUENZA A/B
Influenza A, POC: NEGATIVE
Influenza B, POC: NEGATIVE

## 2024-03-12 LAB — POCT RAPID STREP A (OFFICE): Rapid Strep A Screen: POSITIVE — AB

## 2024-03-12 MED ORDER — DEXTROMETHORPHAN POLISTIREX ER 30 MG/5ML PO SUER: 30.0000 mg | Freq: Two times a day (BID) | ORAL | 0 refills | Status: AC

## 2024-03-12 MED ORDER — PENICILLIN V POTASSIUM 500 MG PO TABS: 500.0000 mg | ORAL_TABLET | Freq: Three times a day (TID) | ORAL | 0 refills | Status: AC

## 2024-03-12 NOTE — Patient Instructions (Addendum)
 Thank you for visiting [facility] today! Here's what we talked about: - Start Penicillin 3 times daily for 10 days - Use cough syrup twice daily - ED if very high fevers, confusion, difficulty breathing

## 2024-03-12 NOTE — Progress Notes (Signed)
 Subjective:   This visit was conducted in person. The patient gave informed consent to the use of Abridge AI technology to record the contents of the encounter as documented below.   Patient ID: Joseph Osborn, male    DOB: 1925-08-27, 89 y.o.   MRN: [id number]   Discussed the use of AI scribe software for clinical note transcription with the patient, who gave verbal consent to proceed.  History of Present Illness Joseph Osborn is a 89 year old male who presents with throat burning and cough.  Three days ago, he began experiencing a significant burning sensation in his throat, which has since improved. However, he has developed a bothersome cough accompanied by a sensation of congestion and a noise when attempting to expel phlegm. There is increased phlegm production but no throat pain.  He feels very dizzy and weak, with a sensation of 'decadence' in his body, and is concerned about falling due to dizziness. He also experiences body aches and occasional chills, though he denies having a fever. He sometimes feels hot, but there is no difficulty breathing.  He notes increased tearing from his eyes, more intense than usual, but denies any new eye discharge or itching. He uses eye drops at night, which he feels may cause a sensation of crystals in his eyes the following day.  He has a history of chronic nasal issues and reports increased nasal discharge, describing it as constant and more than usual. No nasal congestion or headache.  He is currently taking Xarelto .   Review of Systems  All other systems reviewed and are negative.       Allergies  Allergen Reactions   Shellfish Allergy Anaphylaxis   Oxybutynin Other (See Comments)    Doesn't remember reaction   Statins Other (See Comments)    Statin intolerance per cardiology notes    Current Outpatient Medications on File Prior to Visit  Medication Sig Dispense Refill   acetaminophen  (TYLENOL ) 500 MG tablet  Take 2 tablets (1,000 mg total) by mouth in the morning, at noon, and at bedtime.     Cholecalciferol (VITAMIN D3) 25 MCG (1000 UT) capsule Take 1 capsule (1,000 Units total) by mouth daily. 30 capsule    Ensure (ENSURE) Take 237 mLs by mouth.     erythromycin ophthalmic ointment Place 1 Application into both eyes at bedtime. 3.5 g 0   Ipratropium-Albuterol  (COMBIVENT ) 20-100 MCG/ACT AERS respimat Inhale 1 puff into the lungs every 6 (six) hours as needed for wheezing or shortness of breath. 4 g 1   linaclotide  (LINZESS ) 72 MCG capsule Take 1 capsule (72 mcg total) by mouth daily before breakfast. 90 capsule 3   Multiple Vitamin (MULTIVITAMIN WITH MINERALS) TABS tablet Take 1 tablet by mouth daily. 90 tablet 0   rivaroxaban  (XARELTO ) 20 MG TABS tablet Take 1 tablet (20 mg total) by mouth daily with supper. 90 tablet 3   vitamin C (VITAMIN C) 500 MG tablet Take 1 tablet (500 mg total) by mouth daily. 30 tablet 0   No current facility-administered medications on file prior to visit.    BP 118/82 (BP Location: Left Arm, Patient Position: Sitting, Cuff Size: Normal)   Pulse 98   Temp 98.3 F (36.8 C) (Oral)   Ht 5' 4 (1.626 m)   Wt 178 lb (80.7 kg)   SpO2 94%   BMI 30.55 kg/m   Objective:      Physical Exam GENERAL: Alert, cooperative, well-nourished, well-developed, no acute distress.  HEAD: Normocephalic atraumatic. EYES: conjunctivae normal bilaterally, no redness or discharge. EARS: Ear canal and external ear normal bilaterally, presence of cerumen BL. THROAT: Significant posterior oropharyngeal erythema, tonsils 2+ with exudate NEUROLOGICAL: Oriented to person, place and time, no gait abnormalities, moves all extremities without gross motor or sensory deficit. NECK: Mild cervical lymphadenopathy.        Assessment & Plan:   Assessment & Plan Streptococcal pharyngitis Acute streptococcal pharyngitis confirmed by positive strep test in office.  Will treat with oral penicillin  as below. Negative for flu and covid. Will treat cough as well. Has a history of chronic rhinosinusitis, possibly acutely worsened in the setting of infection, however, will refrain from intranasal intervention such as Afrin, given history of poor tolerability in the past and history of epistaxis.   - Prescribed penicillin, one tablet three times a day for ten days. - Advised to monitor for high fevers, confusion, or difficulty breathing and seek emergency care if these occur. - Prescribed cough syrup to be taken twice daily, with the option to take it only at night if drowsiness occurs. - ED precautions given to patient and daughter.   Return if symptoms worsen or fail to improve.   [name] K Ferrel Simington, MD  03/12/24     Contains text generated by Abridge.

## 2024-03-25 ENCOUNTER — Other Ambulatory Visit: Payer: Self-pay

## 2024-03-25 MED ORDER — SHINGRIX 50 MCG/0.5ML IM SUSR: INTRAMUSCULAR | 0 refills | Status: DC

## 2024-03-26 ENCOUNTER — Other Ambulatory Visit: Payer: Self-pay

## 2024-04-18 ENCOUNTER — Encounter: Payer: Self-pay | Admitting: Intensive Care

## 2024-04-18 ENCOUNTER — Emergency Department

## 2024-04-18 ENCOUNTER — Other Ambulatory Visit: Payer: Self-pay

## 2024-04-18 ENCOUNTER — Ambulatory Visit: Payer: Self-pay

## 2024-04-18 ENCOUNTER — Emergency Department
Admission: EM | Admit: 2024-04-18 | Discharge: 2024-04-19 | Disposition: A | Discharge status: Home / Self Care | Source: Ambulatory Visit

## 2024-04-18 DIAGNOSIS — J449 Chronic obstructive pulmonary disease, unspecified: Secondary | ICD-10-CM | POA: Insufficient documentation

## 2024-04-18 DIAGNOSIS — N3001 Acute cystitis with hematuria: Secondary | ICD-10-CM | POA: Insufficient documentation

## 2024-04-18 DIAGNOSIS — N189 Chronic kidney disease, unspecified: Secondary | ICD-10-CM | POA: Insufficient documentation

## 2024-04-18 DIAGNOSIS — I2510 Atherosclerotic heart disease of native coronary artery without angina pectoris: Secondary | ICD-10-CM | POA: Insufficient documentation

## 2024-04-18 DIAGNOSIS — Z7901 Long term (current) use of anticoagulants: Secondary | ICD-10-CM | POA: Insufficient documentation

## 2024-04-18 DIAGNOSIS — R319 Hematuria, unspecified: Secondary | ICD-10-CM

## 2024-04-18 LAB — CBC WITH DIFFERENTIAL/PLATELET
Abs Immature Granulocytes: 0.02 K/uL (ref 0.00–0.07)
Basophils Absolute: 0.1 K/uL (ref 0.0–0.1)
Basophils Relative: 1 %
Eosinophils Absolute: 0.2 K/uL (ref 0.0–0.5)
Eosinophils Relative: 4 %
HCT: 44.8 % (ref 39.0–52.0)
Hemoglobin: 14.7 g/dL (ref 13.0–17.0)
Immature Granulocytes: 0 %
Lymphocytes Relative: 34 %
Lymphs Abs: 1.6 K/uL (ref 0.7–4.0)
MCH: 30.4 pg (ref 26.0–34.0)
MCHC: 32.8 g/dL (ref 30.0–36.0)
MCV: 92.6 fL (ref 80.0–100.0)
Monocytes Absolute: 0.3 K/uL (ref 0.1–1.0)
Monocytes Relative: 7 %
Neutro Abs: 2.4 K/uL (ref 1.7–7.7)
Neutrophils Relative %: 54 %
Platelets: 188 K/uL (ref 150–400)
RBC: 4.84 MIL/uL (ref 4.22–5.81)
RDW: 13.2 % (ref 11.5–15.5)
WBC: 4.6 K/uL (ref 4.0–10.5)
nRBC: 0 % (ref 0.0–0.2)

## 2024-04-18 LAB — URINALYSIS, ROUTINE W REFLEX MICROSCOPIC
Bilirubin Urine: NEGATIVE
Glucose, UA: NEGATIVE mg/dL
Ketones, ur: NEGATIVE mg/dL
Nitrite: NEGATIVE
Protein, ur: NEGATIVE mg/dL
RBC / HPF: >50 RBC/hpf (ref 0–5)
Specific Gravity, Urine: 1.015 (ref 1.005–1.030)
pH: 5 (ref 5.0–8.0)

## 2024-04-18 LAB — COMPREHENSIVE METABOLIC PANEL WITH GFR
ALT: 30 U/L (ref 0–44)
AST: 33 U/L (ref 15–41)
Albumin: 4.3 g/dL (ref 3.5–5.0)
Alkaline Phosphatase: 99 U/L (ref 38–126)
Anion gap: 10 (ref 5–15)
BUN: 22 mg/dL (ref 8–23)
CO2: 24 mmol/L (ref 22–32)
Calcium: 9.3 mg/dL (ref 8.9–10.3)
Chloride: 103 mmol/L (ref 98–111)
Creatinine, Ser: 1.14 mg/dL (ref 0.61–1.24)
GFR, Estimated: 59 mL/min — ABNORMAL LOW (ref >60)
Glucose, Bld: 107 mg/dL — ABNORMAL HIGH (ref 70–99)
Potassium: 4.3 mmol/L (ref 3.5–5.1)
Sodium: 137 mmol/L (ref 135–145)
Total Bilirubin: 0.8 mg/dL (ref 0.0–1.2)
Total Protein: 7.2 g/dL (ref 6.5–8.1)

## 2024-04-18 MED ORDER — SODIUM CHLORIDE 0.9 % IV SOLN
1.0000 g | Freq: Once | INTRAVENOUS | Status: AC
  Administered 2024-04-19: 1 g via INTRAVENOUS
  Filled 2024-04-18: qty 10

## 2024-04-18 MED ORDER — IOHEXOL 300 MG/ML  SOLN
100.0000 mL | Freq: Once | INTRAMUSCULAR | Status: AC | PRN
  Administered 2024-04-18: 100 mL via INTRAVENOUS

## 2024-04-18 MED ORDER — SODIUM CHLORIDE 0.9 % IV BOLUS
500.0000 mL | Freq: Once | INTRAVENOUS | Status: AC
  Administered 2024-04-19: 500 mL via INTRAVENOUS

## 2024-04-18 MED ORDER — CEPHALEXIN 500 MG PO CAPS: 500.0000 mg | ORAL_CAPSULE | Freq: Two times a day (BID) | ORAL | 0 refills | Status: AC

## 2024-04-18 MED ORDER — DIPHENHYDRAMINE HCL 50 MG/ML IJ SOLN
50.0000 mg | Freq: Once | INTRAMUSCULAR | Status: AC
  Administered 2024-04-19: 50 mg via INTRAVENOUS
  Filled 2024-04-18: qty 1

## 2024-04-18 NOTE — ED Provider Notes (Signed)
 [facility] Provider Note    Event Date/Time   First MD Initiated Contact with Patient 04/18/24 1748     (approximate)   History   Urinary Frequency, Bleeding/Bruising, and Arm Pain  Patient reports dark brown urine and urinary frequency/burning during urination. Patient also has bruise on right upper arm with no known injury   HPI Joseph Osborn is a 89 y.o. male PMH prior DVT/PE on Xarelto , COPD, CAD, CKD presents for evaluation of dark urine, urinary urgency/frequency - Patient has been having dark urine and intermittent dysuria and urgency over the past week - Daughter notes he has been drinking somewhat less water  than usual as well - No fever - Has chronic left flank pain, not changed recently - No preceding trauma - Did have a nonspecific urinary tract obstruction in the past year, no known history of urolithiasis - Separately has a bruise to right bicep area.  No tenderness to palpation, full range of motion throughout arm.  Denies any recent falls.  Per chart review, he is followed by urology after incidentally being found to have left moderate to severe hydroureteronephrosis.  Seen in clinic on 09/26/2023.  Etiology unclear, not felt to be a good candidate for diagnostic for surgical intervention.  Stable renal function.     Physical Exam   Triage Vital Signs: ED Triage Vitals  Encounter Vitals Group     BP 04/18/24 1359 (!) 147/82     Girls Systolic BP Percentile --      Girls Diastolic BP Percentile --      Boys Systolic BP Percentile --      Boys Diastolic BP Percentile --      Pulse Rate 04/18/24 1359 86     Resp 04/18/24 1359 18     Temp 04/18/24 1359 97.8 F (36.6 C)     Temp Source 04/18/24 1359 Oral     SpO2 04/18/24 1359 94 %     Weight 04/18/24 1357 175 lb (79.4 kg)     Height 04/18/24 1357 5' 4 (1.626 m)     Head Circumference --      Peak Flow --      Pain Score 04/18/24 1356 1     Pain Loc --      Pain  Education --      Exclude from Growth Chart --     Most recent vital signs: Vitals:   04/18/24 1359 04/18/24 1930  BP: (!) 147/82 (!) 144/76  Pulse: 86 69  Resp: 18 18  Temp: 97.8 F (36.6 C) 97.8 F (36.6 C)  SpO2: 94% 96%     General: Awake, no distress.  CV:  Good peripheral perfusion. RRR, RP 2+ Resp:  Normal effort. CTAB Abd:  No distention. Nontender to deep palpation throughout, no CVAT bilaterally GU:  Normal penile anatomy, no blood at meatus, no lesions   ED Results / Procedures / Treatments   Labs (all labs ordered are listed, but only abnormal results are displayed) Labs Reviewed  COMPREHENSIVE METABOLIC PANEL WITH GFR - Abnormal; Notable for the following components:      Result Value   Glucose, Bld 107 (*)    GFR, Estimated 59 (*)    All other components within normal limits  URINALYSIS, ROUTINE W REFLEX MICROSCOPIC - Abnormal; Notable for the following components:   Color, Urine YELLOW (*)    APPearance HAZY (*)    Hgb urine dipstick LARGE (*)    Leukocytes,Ua TRACE (*)  Bacteria, UA RARE (*)    All other components within normal limits  URINE CULTURE  CBC WITH DIFFERENTIAL/PLATELET     EKG  N/a   RADIOLOGY Pending.    PROCEDURES:  Critical Care performed: No  Procedures   MEDICATIONS ORDERED IN ED: Medications  sodium chloride  0.9 % bolus 500 mL (has no administration in time range)  cefTRIAXone  (ROCEPHIN ) 1 g in sodium chloride  0.9 % 100 mL IVPB (has no administration in time range)     IMPRESSION / MDM / ASSESSMENT AND PLAN / ED COURSE  I reviewed the triage vital signs and the nursing notes.                              DDX/MDM/AP: Differential diagnosis includes, but is not limited to, cystitis, urolithiasis, consider recurrence or worsening of underlying hydroureteronephrosis/urinary tract obstruction.  Consider neoplasm as it is only occasionally painful with urination.  Doubt subsequent anemia but suspect blood  thinners likely contributing to hematuria.  Plan: - Labs - CT abdomen pelvis with contrast - Reassess  Patient's presentation is most consistent with acute presentation with potential threat to life or bodily function.  ED course below.  CBC, CMP reviewed, unremarkable.  Urinalysis with notable hematuria and some pyuria, trace leuk esterase, rare bacteria--equivocal for infection.  Given he does have pain with urination and frequency am treating empirically with ceftriaxone , Ucx added.  CT abdomen pelvis pending.  Patient signed out to evening ED provider Cathy Amber PA-C) pending follow-up of results of CT.  If stable/no acute pathology, recommend treatment for complicated UTI with a course of Keflex  and outpatient urology and PMD follow-up.  Clinical Course as of 04/18/24 2019  Thu Apr 18, 2024  1833 CBC reviewed, unremarkable, no anemia, no leukocytosis, platelets normal [MM]  1833 CMP reviewed, unremarkable, renal function at baseline [MM]  1833 Urinalysis with notable hematuria, small pyuria, rare bacteria, trace leuk esterase, negative nitrite [MM]    Clinical Course User Index [MM] Clarine Ozell LABOR, MD     FINAL CLINICAL IMPRESSION(S) / ED DIAGNOSES   Final diagnoses:  Hematuria, unspecified type  Acute cystitis with hematuria     Rx / DC Orders   ED Discharge Orders          Ordered    cephALEXin  (KEFLEX ) 500 MG capsule  2 times daily        04/18/24 2015             Note:  This document was prepared using Dragon voice recognition software and may include unintentional dictation errors.   Clarine Ozell LABOR, MD 04/18/24 2019

## 2024-04-18 NOTE — Discharge Instructions (Addendum)
 Evaluation in the emergency department was notable for evidence of urinary tract infection, and I believe this is causing the blood in your urine.  We have started you on a course of antibiotics to treat this-open please take the full course as prescribed.  Please follow-up with your primary care provider and urologist for reevaluation, and return to the emergency department with any new or worsening symptoms.    La evaluacin en el servicio de urgencias revel indicios de infeccin del tracto urinario, y creo que esta es la causa de la presencia de retail buyer en la orina. Le hemos recetado un tratamiento con antibiticos; por favor, complete el tratamiento tal como se lo indicamos. Le recomendamos que consulte con su mdico de cabecera y con un urlogo para programmer, systems, y que regrese al servicio de urgencias si presenta sntomas nuevos o si sus sntomas empeoran.

## 2024-04-18 NOTE — ED Triage Notes (Signed)
 Patient reports dark brown urine and urinary frequency/burning during urination. Patient also has bruise on right upper arm with no known injury

## 2024-04-18 NOTE — Telephone Encounter (Signed)
 FYI Only or Action Required?: FYI only for provider: ED advised.  Patient was last seen in primary care on 03/12/2024 by Bennett Reuben POUR, MD.  Called Nurse Triage reporting Urine Output.  Symptoms began several days ago.  Interventions attempted: Other: dtr has tried to get him to drink more.  Symptoms are: unchanged.  Triage Disposition: Go to ED Now (or PCP Triage)  Patient/caregiver understands and will follow disposition?: Yes    Copied from CRM #[id number]. Topic: Clinical - Red Word Triage >> Apr 18, 2024 12:00 PM Joseph Osborn wrote: Red Word that prompted transfer to Nurse Triage: Patient's daughter Ezella is calling because patient is experiencing UTI symptoms, burning sensation and dark urine. Reason for Disposition  [1] Decreased urination and [2] drinking very little AND [3] dehydration suspected (e.g., dark urine, no urine > 12 hours, very dry mouth, very lightheaded)  Answer Assessment - Initial Assessment Questions Pt's dtr called patient is having some burning with urination and his urine has been dark. She states she made him a celery drink yesterday and made him drink it and it is more clear today and he is going a little bit more. During triage she states pt is not drinking very much, not peeing as much, burning with urination, lightheaded and a very dry mouth. She also said he noticed a bruise on his right arm. He denies any injury but has had a torn muscle before and wanted to know if that was what this could be. Rn advised given the urinary symptoms, dry mouth and lightheaded, recommendation is for patient to go to the ER as these are all signs of possible dehydration. Dtr stated understanding. Denies needing assistance getting him to the hospital, states she just needs to convince him to go. Rn stated understanding and to call back if she needs any further assistance.     1. SYMPTOM: What's the main symptom you're concerned about? (e.g., frequency, incontinence)     Burning,  dark urine 2. ONSET: When did the  symptoms  start?     A couple of days ago 4. CAUSE: What do you think is causing the symptoms?     unknown 5. OTHER SYMPTOMS: Do you have any other symptoms? (e.g., blood in urine, fever, flank pain, pain with urination)     Light headed, dry mouth  Protocols used: Urinary Symptoms-A-AH

## 2024-04-19 ENCOUNTER — Inpatient Hospital Stay: Admitting: Nurse Practitioner

## 2024-04-19 DIAGNOSIS — N3001 Acute cystitis with hematuria: Secondary | ICD-10-CM | POA: Diagnosis not present

## 2024-04-19 LAB — URINE CULTURE: Culture: NO GROWTH

## 2024-04-19 MED ORDER — METHYLPREDNISOLONE SODIUM SUCC 125 MG IJ SOLR
125.0000 mg | Freq: Once | INTRAMUSCULAR | Status: AC
  Administered 2024-04-19: 125 mg via INTRAVENOUS
  Filled 2024-04-19: qty 2

## 2024-04-19 NOTE — ED Notes (Signed)
 Notified by the CT tech that the patient had a reaction to the contrast. Reaction described as itchy rash across chest and neck. Provider notified and gave verbal orders for 50mg  IV benadryl.

## 2024-04-19 NOTE — Telephone Encounter (Signed)
 Seen at ER, treated for UTI with keflex  course.  Had reaction to CT contrast treated with IV benadryl and solumedrol.  Please offer f/u in office next week.

## 2024-04-19 NOTE — ED Notes (Addendum)
 Pt complaining of tongue swelling. Provider notified. Received verbal orders for solumedrol 125mg  IV. Tongue appeared normal during visual assessment. Pt A&Ox4, NAD.

## 2024-04-19 NOTE — ED Provider Notes (Signed)
----------------------------------------- °  12:09 AM on 04/19/2024 ----------------------------------------- I have personally seen and evaluated the patient.  Patient began having hives/itching approximately 10 to 15 minutes after receiving IV contrast.  Patient states he has had IV contrast in the past without a reaction.  Patient has several hives to the lower face and neck.  Patient is speaking well.  Denies any trouble breathing denies any swelling sensation of the tongue or the throat.  We will dose IV Benadryl and reassess.  Awaiting CT results.  Patient told the nurse they were having some tongue issues.  I went in and saw the patient he denied any swelling to the tongue states that he was having some trouble getting his words out but this was approximately 20 minutes after receiving IV Benadryl highly suspect it was more due to the Benadryl than to the reaction.  He had no tongue or pharyngeal swelling.  I did add 125 mg of Solu-Medrol  as a precaution.  Patient is asking to go home.  He appears much better.  Denies any itching denies any trouble speaking denies any swelling.  The hives are significantly reduced only 1 or 2 very tiny hives remain.  CT scan is negative for any significant finding besides a urinary tract infection.  Will discharge on antibiotics have the patient follow-up with his doctor.  Daughter and patient agreeable.   Dorothyann Drivers, MD 04/19/24 [phone]

## 2024-04-19 NOTE — Telephone Encounter (Signed)
Patient has been scheduled for visit  °

## 2024-04-22 ENCOUNTER — Ambulatory Visit: Admitting: Family Medicine

## 2024-04-22 ENCOUNTER — Encounter: Payer: Self-pay | Admitting: Family Medicine

## 2024-04-22 VITALS — BP 132/74 | HR 86 | Temp 97.8°F | Ht 64.0 in | Wt 178.0 lb

## 2024-04-22 DIAGNOSIS — S46209A Unspecified injury of muscle, fascia and tendon of other parts of biceps, unspecified arm, initial encounter: Secondary | ICD-10-CM

## 2024-04-22 DIAGNOSIS — N135 Crossing vessel and stricture of ureter without hydronephrosis: Secondary | ICD-10-CM

## 2024-04-22 DIAGNOSIS — R310 Gross hematuria: Secondary | ICD-10-CM

## 2024-04-22 DIAGNOSIS — M1711 Unilateral primary osteoarthritis, right knee: Secondary | ICD-10-CM

## 2024-04-22 DIAGNOSIS — N182 Chronic kidney disease, stage 2 (mild): Secondary | ICD-10-CM

## 2024-04-22 DIAGNOSIS — N134 Hydroureter: Secondary | ICD-10-CM | POA: Insufficient documentation

## 2024-04-22 NOTE — Patient Instructions (Signed)
 Recomiendo que regrese a ver al Dr Cleotilde para evaluar el hombro.  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 Compre raised commode seat - le he dado receta para esto hoy.  Esperaremos evaulacion de [employer] episodio de orina oscura.

## 2024-04-22 NOTE — Progress Notes (Unsigned)
 Ph: [phone] Fax: [phone]   Patient ID: Joseph Osborn, male    DOB: 1925/05/22, 89 y.o.   MRN: [id number]  This visit was conducted in person.  BP 132/74   Pulse 86   Temp 97.8 F (36.6 C) (Oral)   Ht 5' 4 (1.626 m)   Wt 178 lb (80.7 kg)   SpO2 95%   BMI 30.55 kg/m    CC: ER f/u visit  Subjective:   HPI: Joseph Osborn is a 89 y.o. male presenting on 04/22/2024 for Hospitalization Follow-up (With Daughter Ezella )   Recent ER visit for dark urine associated with urinary urgency and frequency, UA abnormal (UCx returned no growth), treated with IV ceftriaxone  x1 followed by keflex  BID 10d course. He's only been taking one daily.   Developed contrast reaction afte IV contrast for CT scan (itching and hives 15 min after IV contrast). Treated with IV benadryl  followed by IV solumedrol.   03/25/2024 - saw ortho s/p steroid injection to R knee. Rec continue hinged knee brace, ice/heat.   Treated for strep throat 03/12/2024 with penicillin  course.   Fall back in April 2025-  with residual R arm pain. This past weekend developed sudden R upper arm pain without inciting trauma/injury and subsequently developed large R upper arm bruising. They have been treating with tylenol , ice.      Relevant past medical, surgical, family and social history reviewed and updated as indicated. Interim medical history since our last visit reviewed. Allergies and medications reviewed and updated. Outpatient Medications Prior to Visit  Medication Sig Dispense Refill   acetaminophen  (TYLENOL ) 500 MG tablet Take 2 tablets (1,000 mg total) by mouth in the morning, at noon, and at bedtime.     cephALEXin  (KEFLEX ) 500 MG capsule Take 1 capsule (500 mg total) by mouth 2 (two) times daily for 10 days. 20 capsule 0   Ensure (ENSURE) Take 237 mLs by mouth.     Ipratropium-Albuterol  (COMBIVENT ) 20-100 MCG/ACT AERS respimat Inhale 1 puff into the lungs every 6 (six) hours as needed for  wheezing or shortness of breath. 4 g 1   linaclotide  (LINZESS ) 72 MCG capsule Take 1 capsule (72 mcg total) by mouth daily before breakfast. 90 capsule 3   rivaroxaban  (XARELTO ) 20 MG TABS tablet Take 1 tablet (20 mg total) by mouth daily with supper. 90 tablet 3   Cholecalciferol (VITAMIN D3) 25 MCG (1000 UT) capsule Take 1 capsule (1,000 Units total) by mouth daily. (Patient not taking: Reported on 04/22/2024) 30 capsule    erythromycin  ophthalmic ointment Place 1 Application into both eyes at bedtime. (Patient not taking: Reported on 04/22/2024) 3.5 g 0   Multiple Vitamin (MULTIVITAMIN WITH MINERALS) TABS tablet Take 1 tablet by mouth daily. (Patient not taking: Reported on 04/22/2024) 90 tablet 0   vitamin C (VITAMIN C) 500 MG tablet Take 1 tablet (500 mg total) by mouth daily. (Patient not taking: Reported on 04/22/2024) 30 tablet 0   Zoster Vaccine Adjuvanted (SHINGRIX ) injection Inject into the muscle. 1 mL 0   No facility-administered medications prior to visit.     Per HPI unless specifically indicated in ROS section below Review of Systems  Objective:  BP 132/74   Pulse 86   Temp 97.8 F (36.6 C) (Oral)   Ht 5' 4 (1.626 m)   Wt 178 lb (80.7 kg)   SpO2 95%   BMI 30.55 kg/m   Wt Readings from Last 3 Encounters:  04/22/24 178  lb (80.7 kg)  04/18/24 175 lb (79.4 kg)  03/12/24 178 lb (80.7 kg)      Physical Exam Vitals and nursing note reviewed.  Constitutional:      Appearance: Normal appearance. He is not ill-appearing.  HENT:     Mouth/Throat:     Mouth: Mucous membranes are moist.     Pharynx: Oropharynx is clear. No oropharyngeal exudate or posterior oropharyngeal erythema.  Eyes:     Extraocular Movements: Extraocular movements intact.     Pupils: Pupils are equal, round, and reactive to light.  Musculoskeletal:        General: Tenderness and signs of injury present.     Right lower leg: No edema.     Left lower leg: No edema.     Comments:  L shoulder  WNL Distal R upper anterior arm with significant bruising, distal bulge present at location of biceps tendon that does not fully contract  Pain with ROM at shoulder, predominantly pain with Speed's test.  No significant pain with rotation humerus in [facility] joint  No pain with testing RTC in int/ext rotation  Discomfort with empty can sign  Skin:    General: Skin is warm and dry.     Findings: Bruising present. No rash.  Neurological:     Mental Status: He is alert.  Psychiatric:        Mood and Affect: Mood normal.        Behavior: Behavior normal.       Results for orders placed or performed during the hospital encounter of 04/18/24  CBC with Differential   Collection Time: 04/18/24  2:00 PM  Result Value Ref Range   WBC 4.6 4.0 - 10.5 K/uL   RBC 4.84 4.22 - 5.81 MIL/uL   Hemoglobin 14.7 13.0 - 17.0 g/dL   HCT 55.1 60.9 - 47.9 %   MCV 92.6 80.0 - 100.0 fL   MCH 30.4 26.0 - 34.0 pg   MCHC 32.8 30.0 - 36.0 g/dL   RDW 86.7 88.4 - 84.4 %   Platelets 188 150 - 400 K/uL   nRBC 0.0 0.0 - 0.2 %   Neutrophils Relative % 54 %   Neutro Abs 2.4 1.7 - 7.7 K/uL   Lymphocytes Relative 34 %   Lymphs Abs 1.6 0.7 - 4.0 K/uL   Monocytes Relative 7 %   Monocytes Absolute 0.3 0.1 - 1.0 K/uL   Eosinophils Relative 4 %   Eosinophils Absolute 0.2 0.0 - 0.5 K/uL   Basophils Relative 1 %   Basophils Absolute 0.1 0.0 - 0.1 K/uL   Immature Granulocytes 0 %   Abs Immature Granulocytes 0.02 0.00 - 0.07 K/uL  Comprehensive metabolic panel   Collection Time: 04/18/24  2:00 PM  Result Value Ref Range   Sodium 137 135 - 145 mmol/L   Potassium 4.3 3.5 - 5.1 mmol/L   Chloride 103 98 - 111 mmol/L   CO2 24 22 - 32 mmol/L   Glucose, Bld 107 (H) 70 - 99 mg/dL   BUN 22 8 - 23 mg/dL   Creatinine, Ser 8.85 0.61 - 1.24 mg/dL   Calcium 9.3 8.9 - 89.6 mg/dL   Total Protein 7.2 6.5 - 8.1 g/dL   Albumin 4.3 3.5 - 5.0 g/dL   AST 33 15 - 41 U/L   ALT 30 0 - 44 U/L   Alkaline Phosphatase 99 38 - 126 U/L   Total  Bilirubin 0.8 0.0 - 1.2 mg/dL   GFR, Estimated 59 (  L) >60 mL/min   Anion gap 10 5 - 15  Urine Culture   Collection Time: 04/18/24  4:30 PM   Specimen: Urine, Clean Catch  Result Value Ref Range   Specimen Description      URINE, CLEAN CATCH Performed at [facility], [address], [place], [place] [address]    Special Requests      NONE Performed at [facility], [address] [place], [place] [address]    Culture      NO GROWTH Performed at [facility], [address] [place], [place] [address]    Report Status 04/19/2024 FINAL   Urinalysis, Routine w reflex microscopic -Urine, Clean Catch   Collection Time: 04/18/24  4:30 PM  Result Value Ref Range   Color, Urine YELLOW (A) YELLOW   APPearance HAZY (A) CLEAR   Specific Gravity, Urine 1.015 1.005 - 1.030   pH 5.0 5.0 - 8.0   Glucose, UA NEGATIVE NEGATIVE mg/dL   Hgb urine dipstick LARGE (A) NEGATIVE   Bilirubin Urine NEGATIVE NEGATIVE   Ketones, ur NEGATIVE NEGATIVE mg/dL   Protein, ur NEGATIVE NEGATIVE mg/dL   Nitrite NEGATIVE NEGATIVE   Leukocytes,Ua TRACE (A) NEGATIVE   RBC / HPF >50 0 - 5 RBC/hpf   WBC, UA 6-10 0 - 5 WBC/hpf   Bacteria, UA RARE (A) NONE SEEN   Squamous Epithelial / HPF 0-5 0 - 5 /HPF   Mucus PRESENT    Hyaline Casts, UA PRESENT    CT ABDOMEN PELVIS W CONTRAST EXAM: CT ABDOMEN AND PELVIS WITH CONTRAST 04/18/2024 11:50:51 PM  TECHNIQUE: CT of the abdomen and pelvis was performed with the administration of 100 mL of iohexol  (OMNIPAQUE ) 300 MG/ML solution. Multiplanar reformatted images are provided for review. Automated exposure control, iterative reconstruction, and/or weight-based adjustment of the mA/kV was utilized to reduce the radiation dose to as low as reasonably achievable.  COMPARISON: 04/16/2023, 09/09/17  CLINICAL HISTORY: Hematuria --eval for evidence of infection, bladder or renal mass, urolithiasis, other pathology.  FINDINGS:  LOWER  CHEST: Tiny hiatal hernia.  LIVER: The liver is unremarkable.  GALLBLADDER AND BILE DUCTS: The gallbladder is not visualized and is likely surgically absent. No biliary ductal dilatation.  SPLEEN: No acute abnormality.  PANCREAS: Diffusely atrophic pancreas. No focal lesion. Otherwise normal pancreatic contour. No surrounding inflammatory changes. No main pancreatic ductal dilatation.  ADRENAL GLANDS: No acute abnormality.  KIDNEYS, URETERS AND BLADDER: Stable mild-to-moderate left hydroureteronephrosis with abrupt transition at the distal left ureter. Associated urothelial thickening. No nephroureterolithiasis bilaterally. No right hydroureteronephrosis. The prostate is enlarged, measuring up to 5.3 cm. Urinary bladder is unremarkable. No filling defects of the partially visualized collecting systems on delayed imaging.  GI AND BOWEL: Stomach demonstrates no acute abnormality. No small or large bowel thickening or dilatation. The appendix is unremarkable. Colonic diverticulosis.  PERITONEUM AND RETROPERITONEUM: No ascites. No free air.  VASCULATURE: Aorta is normal in caliber. Moderate atherosclerotic plaque.  LYMPH NODES: No lymphadenopathy.  REPRODUCTIVE ORGANS: No acute abnormality.  BONES AND SOFT TISSUES: No acute osseous abnormality. Several small fat-containing supraumbilical wall hernias with the inferior-most hernia containing omental fat demonstrating chronic fat stranding and abdominal defect of 1.5 cm (2:44). Tiny fat-containing inguinal hernias.  IMPRESSION: 1. Stable mild-to-moderate left hydroureteronephrosis with abrupt transition at the distal left ureter. Associated urothelial thickening. Correlate with urinalysis for superimposed infection. 2. Prostatomegaly. 3. Similar appearing supraumbilical fat containing abdominal wall hernias.  Electronically signed by: Morgane Naveau MD 04/19/2024 12:22  AM EST RP Workstation: [id number]    Assessment & Plan:   Problem List Items Addressed This Visit     CKD (chronic kidney disease) stage 2, GFR 60-89 ml/min   Latest GFR stable at 59      Primary osteoarthritis of right knee   He continues receiving intermittent steroid injections to R shoulder - latest 03/2024 by dr Cleotilde ortho. Will order raised toilet seat. Rx written and mailed to patient.       Acquired ureterovesical junction (UVJ) obstruction   This persists on latest contrasted CT abd/pelvis 04/18/2024 - stable mild-to-moderate left hydroureteronephrosis with abrupt transition at the distal left ureter. Associated urothelial thickening. Correlate with urinalysis for superimposed infection.  Discussed with patient and daughter - difficult situation for further diagnostic evaluation given age and comorbidities including xarelto  use.  He has urology f/u planned later this week.       Gross hematuria   Recent dark urine likely from hematuria. UA with micro showed large Hgb, tr LE, >50 RBC, 6-10 WBC, rare bacteria. UCx returned no growth (done prior to abx commencement) Predominant symptom was dark urine - likely from hematuria. I don't think he had UTI. Regardless he desires to continue keflex  antibiotic as he has felt better when taking - is only taking one a day.  Keep urology f/u later this week.       Injury of tendon of biceps - Primary   R shoulder - story/exam consistent with proximal biceps rupture. Anticipate degenerative injury as denies inciting trauma - daughter thinks it may have happened when he was getting up from commode.  Supportive measures reviewed.  Doubt surgical candidate.  Did suggest ortho f/u for evaluation/recommendations ?PT.  He also has h/o RTC tear from shoulder CT back in 08/2023 - at that time treated with course of outpatient PT.         No orders of the defined types were placed in this encounter.   No orders of the defined types were placed in this  encounter.   Patient Instructions  Recomiendo que regrese a ver al Dr Cleotilde para evaluar el hombro.  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 Compre raised commode seat - le he dado receta para esto hoy.  Esperaremos evaulacion de [facility] episodio de orina oscura.  Follow up plan: Return if symptoms worsen or fail to improve.  Anton Blas, MD

## 2024-04-22 NOTE — Progress Notes (Unsigned)
° °  04/24/2024 7:52 AM   Joseph Osborn [birth year] [id number]  Reason for visit: Follow up UTI, chronic left hydroureteronephrosis   HPI: 89 y.o. male, initial follow up with me today, previously seen by Dr. Penne in May 2025  Initial meeting with me Visit completed via in person Spanish interpreter Recent ED visit 04/19/2024 -diagnosed with acute UTI  - CT A/P = stable, known mild to moderate left hydroureteronephrosis with transition point at distal left ureter, urothelial thickening  Prior HPI: Hx of chronic Left hydroureteronephrosis  - Unclear etiology, possible left UVJ obstruction -elected  History of submassive PE requiring thrombectomy in 2024  - Following this-sounds like patient and daughter have elected very much conservative management for his urologic issues    Physical Exam: There were no vitals taken for this visit.   Constitutional:  Alert and oriented, No acute distress.  Laboratory Data: UA 04/18/2024- > 50 RBC, 6-10 WBC, rare bacteria Urine culture 04/18/2024 -negative  Pertinent Imaging: I have personally viewed and interpreted the CT A/P 04/18/2024-stable and known chronic left hydroureteronephrosis with acute transition point at distal left ureter approximately 1-2 cm from UVJ.  Delayed films do not scan distal to mid ureter, unable to evaluate distal ureters.  Contralateral right kidney morphologically normal in appearance, no hydronephrosis.  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

## 2024-04-22 NOTE — Assessment & Plan Note (Signed)
 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

## 2024-04-23 ENCOUNTER — Telehealth: Payer: Self-pay | Admitting: Family Medicine

## 2024-04-23 DIAGNOSIS — S46209A Unspecified injury of muscle, fascia and tendon of other parts of biceps, unspecified arm, initial encounter: Secondary | ICD-10-CM | POA: Insufficient documentation

## 2024-04-23 NOTE — Assessment & Plan Note (Addendum)
 This persists on latest contrasted CT abd/pelvis 04/18/2024 - stable mild-to-moderate left hydroureteronephrosis with abrupt transition at the distal left ureter. Associated urothelial thickening. Correlate with urinalysis for superimposed infection.  Discussed with patient and daughter - difficult situation for further diagnostic evaluation given age and comorbidities including xarelto  use.  He has urology f/u planned later this week.

## 2024-04-23 NOTE — Assessment & Plan Note (Addendum)
 R shoulder - story/exam consistent with proximal biceps rupture. Anticipate degenerative injury as denies inciting trauma - daughter thinks it may have happened when he was getting up from commode.  Supportive measures reviewed.  Doubt surgical candidate.  Did suggest ortho f/u for evaluation/recommendations ?PT.  He also has h/o RTC tear from shoulder CT back in 08/2023 - at that time treated with course of outpatient PT.

## 2024-04-23 NOTE — Assessment & Plan Note (Signed)
 Latest GFR stable at 59

## 2024-04-23 NOTE — Assessment & Plan Note (Addendum)
 He continues receiving intermittent steroid injections to R shoulder - latest 03/2024 by dr Cleotilde ortho. Will order raised toilet seat. Rx written and mailed to patient.

## 2024-04-23 NOTE — Telephone Encounter (Signed)
 Per Dr. Rilla order for Raised toilet seat has been entered in on [facility]. DZ M17.11, S46.20A. No further action needed.

## 2024-04-23 NOTE — Assessment & Plan Note (Addendum)
 Recent dark urine likely from hematuria. UA with micro showed large Hgb, tr LE, >50 RBC, 6-10 WBC, rare bacteria. UCx returned no growth (done prior to abx commencement) Predominant symptom was dark urine - likely from hematuria. I don't think he had UTI. Regardless he desires to continue keflex  antibiotic as he has felt better when taking - is only taking one a day.  Keep urology f/u later this week.

## 2024-04-24 ENCOUNTER — Ambulatory Visit: Admitting: Urology

## 2024-04-24 ENCOUNTER — Encounter: Payer: Self-pay | Admitting: Urology

## 2024-04-24 VITALS — BP 161/81 | HR 83 | Ht 64.0 in | Wt 177.4 lb

## 2024-04-24 DIAGNOSIS — N3000 Acute cystitis without hematuria: Secondary | ICD-10-CM

## 2024-04-24 DIAGNOSIS — N390 Urinary tract infection, site not specified: Secondary | ICD-10-CM | POA: Insufficient documentation

## 2024-04-24 DIAGNOSIS — N134 Hydroureter: Secondary | ICD-10-CM

## 2024-04-24 NOTE — Assessment & Plan Note (Addendum)
 Presumed UTI from 12/12  - although negative urine culture  - Only symptom was subjective hematuria (coffee with milk colored urine) -no overt UTI symptoms   PVR 1cc today   Offered reassurance today.  Similar to above, I would not engage in aggressive diagnostic workup for microhematuria.  He may discontinue previously prescribed antibiotics as this does not seem like a true UTI.  Microhematuria may be secondary to his distal left ureteral pathology, which as above, is unclear in etiology but could be stricture or malignant.

## 2024-05-03 NOTE — Telephone Encounter (Signed)
 Signed [place] [facility] request and placed in CMA box.

## 2024-05-15 ENCOUNTER — Ambulatory Visit (INDEPENDENT_AMBULATORY_CARE_PROVIDER_SITE_OTHER)

## 2024-05-15 DIAGNOSIS — Z23 Encounter for immunization: Secondary | ICD-10-CM | POA: Diagnosis not present

## 2024-05-15 NOTE — Progress Notes (Signed)
 Per orders of Dr. Anton Blas, injection of Shingles  given by Danna CINDERELLA Hummer in Left deltoid Patient tolerated injection well. Will call if any questions. Given VIS

## 2024-08-05 ENCOUNTER — Encounter: Admitting: Dermatology

## 2024-08-09 ENCOUNTER — Other Ambulatory Visit

## 2024-08-16 ENCOUNTER — Encounter: Admitting: Family Medicine

## 2025-04-23 ENCOUNTER — Ambulatory Visit: Admitting: Urology
